# Patient Record
Sex: Female | Born: 1940
Health system: Southern US, Community
[De-identification: ages and names within clinical notes are randomized; demographics above are authoritative.]

## PROBLEM LIST (undated history)

## (undated) DIAGNOSIS — E119 Type 2 diabetes mellitus without complications: Secondary | ICD-10-CM

## (undated) DIAGNOSIS — F411 Generalized anxiety disorder: Secondary | ICD-10-CM

## (undated) DIAGNOSIS — I10 Essential (primary) hypertension: Secondary | ICD-10-CM

## (undated) DIAGNOSIS — K219 Gastro-esophageal reflux disease without esophagitis: Secondary | ICD-10-CM

## (undated) HISTORY — DX: Type 2 diabetes mellitus without complications: E11.9

## (undated) HISTORY — DX: Generalized anxiety disorder: F41.1

## (undated) HISTORY — PX: JOINT REPLACEMENT: SHX530

## (undated) HISTORY — DX: Gastro-esophageal reflux disease without esophagitis: K21.9

## (undated) HISTORY — DX: Essential (primary) hypertension: I10

---

## 1988-02-10 DIAGNOSIS — C801 Malignant (primary) neoplasm, unspecified: Secondary | ICD-10-CM

## 1988-02-10 HISTORY — PX: MASTECTOMY: SHX3

## 1988-02-10 HISTORY — DX: Malignant (primary) neoplasm, unspecified: C80.1

## 1997-10-02 ENCOUNTER — Other Ambulatory Visit: Admission: RE | Admit: 1997-10-02 | Discharge: 1997-10-02 | Payer: Self-pay | Admitting: Gynecology

## 1998-09-19 ENCOUNTER — Other Ambulatory Visit: Admission: RE | Admit: 1998-09-19 | Discharge: 1998-09-19 | Payer: Self-pay | Admitting: Gynecology

## 1998-10-07 ENCOUNTER — Other Ambulatory Visit: Admission: RE | Admit: 1998-10-07 | Discharge: 1998-10-07 | Payer: Self-pay | Admitting: Surgery

## 1998-11-27 ENCOUNTER — Encounter: Payer: Self-pay | Admitting: Surgery

## 1998-11-27 ENCOUNTER — Encounter: Admission: RE | Admit: 1998-11-27 | Discharge: 1998-11-27 | Payer: Self-pay | Admitting: Surgery

## 1998-11-28 ENCOUNTER — Ambulatory Visit (HOSPITAL_BASED_OUTPATIENT_CLINIC_OR_DEPARTMENT_OTHER): Admission: RE | Admit: 1998-11-28 | Discharge: 1998-11-28 | Payer: Self-pay | Admitting: Surgery

## 1999-04-23 ENCOUNTER — Encounter: Payer: Self-pay | Admitting: Oncology

## 1999-04-23 ENCOUNTER — Encounter: Admission: RE | Admit: 1999-04-23 | Discharge: 1999-04-23 | Payer: Self-pay | Admitting: Oncology

## 1999-06-23 ENCOUNTER — Encounter: Payer: Self-pay | Admitting: Emergency Medicine

## 1999-06-23 ENCOUNTER — Emergency Department (HOSPITAL_COMMUNITY): Admission: EM | Admit: 1999-06-23 | Discharge: 1999-06-23 | Payer: Self-pay | Admitting: Emergency Medicine

## 1999-09-23 ENCOUNTER — Other Ambulatory Visit: Admission: RE | Admit: 1999-09-23 | Discharge: 1999-09-23 | Payer: Self-pay | Admitting: Gynecology

## 2000-04-26 ENCOUNTER — Encounter: Payer: Self-pay | Admitting: Gynecology

## 2000-04-26 ENCOUNTER — Encounter: Admission: RE | Admit: 2000-04-26 | Discharge: 2000-04-26 | Payer: Self-pay | Admitting: Gynecology

## 2000-09-28 ENCOUNTER — Other Ambulatory Visit: Admission: RE | Admit: 2000-09-28 | Discharge: 2000-09-28 | Payer: Self-pay | Admitting: Gynecology

## 2001-04-19 ENCOUNTER — Encounter: Payer: Self-pay | Admitting: Family Medicine

## 2001-04-19 ENCOUNTER — Encounter: Admission: RE | Admit: 2001-04-19 | Discharge: 2001-04-19 | Payer: Self-pay | Admitting: Family Medicine

## 2001-10-04 ENCOUNTER — Other Ambulatory Visit: Admission: RE | Admit: 2001-10-04 | Discharge: 2001-10-04 | Payer: Self-pay | Admitting: Gynecology

## 2002-04-21 ENCOUNTER — Encounter: Admission: RE | Admit: 2002-04-21 | Discharge: 2002-04-21 | Payer: Self-pay | Admitting: Oncology

## 2002-04-21 ENCOUNTER — Encounter: Payer: Self-pay | Admitting: Oncology

## 2003-05-03 ENCOUNTER — Encounter: Admission: RE | Admit: 2003-05-03 | Discharge: 2003-05-03 | Payer: Self-pay | Admitting: Oncology

## 2003-10-16 ENCOUNTER — Other Ambulatory Visit: Admission: RE | Admit: 2003-10-16 | Discharge: 2003-10-16 | Payer: Self-pay | Admitting: Gynecology

## 2004-05-22 ENCOUNTER — Ambulatory Visit: Payer: Self-pay | Admitting: Oncology

## 2004-10-20 ENCOUNTER — Other Ambulatory Visit: Admission: RE | Admit: 2004-10-20 | Discharge: 2004-10-20 | Payer: Self-pay | Admitting: Gynecology

## 2005-05-08 ENCOUNTER — Ambulatory Visit: Payer: Self-pay | Admitting: Oncology

## 2006-05-13 ENCOUNTER — Ambulatory Visit: Payer: Self-pay | Admitting: Oncology

## 2006-11-01 ENCOUNTER — Other Ambulatory Visit: Admission: RE | Admit: 2006-11-01 | Discharge: 2006-11-01 | Payer: Self-pay | Admitting: Gynecology

## 2010-01-27 ENCOUNTER — Inpatient Hospital Stay (HOSPITAL_COMMUNITY)
Admission: RE | Admit: 2010-01-27 | Discharge: 2010-01-30 | Payer: Self-pay | Source: Home / Self Care | Attending: Orthopedic Surgery | Admitting: Orthopedic Surgery

## 2010-04-21 LAB — GLUCOSE, CAPILLARY
Glucose-Capillary: 119 mg/dL — ABNORMAL HIGH (ref 70–99)
Glucose-Capillary: 129 mg/dL — ABNORMAL HIGH (ref 70–99)
Glucose-Capillary: 137 mg/dL — ABNORMAL HIGH (ref 70–99)
Glucose-Capillary: 142 mg/dL — ABNORMAL HIGH (ref 70–99)
Glucose-Capillary: 143 mg/dL — ABNORMAL HIGH (ref 70–99)
Glucose-Capillary: 194 mg/dL — ABNORMAL HIGH (ref 70–99)

## 2010-04-21 LAB — PROTIME-INR: INR: 0.99 (ref 0.00–1.49)

## 2010-04-21 LAB — URINALYSIS, ROUTINE W REFLEX MICROSCOPIC
Hgb urine dipstick: NEGATIVE
Protein, ur: NEGATIVE mg/dL
Urobilinogen, UA: 0.2 mg/dL (ref 0.0–1.0)

## 2010-04-21 LAB — APTT: aPTT: 41 seconds — ABNORMAL HIGH (ref 24–37)

## 2010-04-21 LAB — ABO/RH: ABO/RH(D): O POS

## 2010-04-21 LAB — CROSSMATCH
ABO/RH(D): O POS
Antibody Screen: NEGATIVE
Unit division: 0
Unit division: 0

## 2010-04-21 LAB — BASIC METABOLIC PANEL
BUN: 8 mg/dL (ref 6–23)
CO2: 25 mEq/L (ref 19–32)
CO2: 27 mEq/L (ref 19–32)
Calcium: 8.4 mg/dL (ref 8.4–10.5)
Calcium: 8.7 mg/dL (ref 8.4–10.5)
Chloride: 107 mEq/L (ref 96–112)
Creatinine, Ser: 0.98 mg/dL (ref 0.4–1.2)
Creatinine, Ser: 1.04 mg/dL (ref 0.4–1.2)
GFR calc Af Amer: >60 mL/min (ref >60)
GFR calc non Af Amer: 51 mL/min — ABNORMAL LOW (ref >60)
GFR calc non Af Amer: 56 mL/min — ABNORMAL LOW (ref >60)
Glucose, Bld: 121 mg/dL — ABNORMAL HIGH (ref 70–99)
Glucose, Bld: 190 mg/dL — ABNORMAL HIGH (ref 70–99)
Potassium: 3.8 mEq/L (ref 3.5–5.1)
Sodium: 135 mEq/L (ref 135–145)
Sodium: 137 mEq/L (ref 135–145)
Sodium: 138 mEq/L (ref 135–145)

## 2010-04-21 LAB — URINE CULTURE

## 2010-04-21 LAB — COMPREHENSIVE METABOLIC PANEL
BUN: 12 mg/dL (ref 6–23)
Calcium: 9.5 mg/dL (ref 8.4–10.5)
Creatinine, Ser: 1.16 mg/dL (ref 0.4–1.2)
Glucose, Bld: 122 mg/dL — ABNORMAL HIGH (ref 70–99)
Total Protein: 7 g/dL (ref 6.0–8.3)

## 2010-04-21 LAB — CBC
HCT: 27.4 % — ABNORMAL LOW (ref 36.0–46.0)
HCT: 38.2 % (ref 36.0–46.0)
Hemoglobin: 9.1 g/dL — ABNORMAL LOW (ref 12.0–15.0)
Hemoglobin: 9.1 g/dL — ABNORMAL LOW (ref 12.0–15.0)
MCH: 30.7 pg (ref 26.0–34.0)
MCHC: 33.3 g/dL (ref 30.0–36.0)
MCHC: 34.2 g/dL (ref 30.0–36.0)
MCHC: 33 g/dL (ref 30.0–36.0)
MCV: 91 fL (ref 78.0–100.0)
MCV: 91.8 fL (ref 78.0–100.0)
Platelets: 228 10*3/uL (ref 150–400)
Platelets: 235 10*3/uL (ref 150–400)
RBC: 3.01 MIL/uL — ABNORMAL LOW (ref 3.87–5.11)
RDW: 12.6 % (ref 11.5–15.5)
RDW: 12.6 % (ref 11.5–15.5)
RDW: 12.5 % (ref 11.5–15.5)
WBC: 11.2 10*3/uL — ABNORMAL HIGH (ref 4.0–10.5)
WBC: 9.8 10*3/uL (ref 4.0–10.5)

## 2010-04-21 LAB — DIFFERENTIAL
Basophils Relative: 1 % (ref 0–1)
Lymphs Abs: 1.7 10*3/uL (ref 0.7–4.0)
Monocytes Relative: 8 % (ref 3–12)
Neutro Abs: 5.5 10*3/uL (ref 1.7–7.7)
Neutrophils Relative %: 69 % (ref 43–77)

## 2010-04-21 LAB — SURGICAL PCR SCREEN: MRSA, PCR: NEGATIVE

## 2010-04-21 LAB — HEMOGLOBIN A1C
Hgb A1c MFr Bld: 6.1 % — ABNORMAL HIGH (ref <5.7)
Mean Plasma Glucose: 128 mg/dL — ABNORMAL HIGH (ref <117)

## 2011-02-16 DIAGNOSIS — M1710 Unilateral primary osteoarthritis, unspecified knee: Secondary | ICD-10-CM | POA: Diagnosis not present

## 2011-02-24 DIAGNOSIS — R229 Localized swelling, mass and lump, unspecified: Secondary | ICD-10-CM | POA: Diagnosis not present

## 2011-05-04 DIAGNOSIS — K449 Diaphragmatic hernia without obstruction or gangrene: Secondary | ICD-10-CM | POA: Diagnosis not present

## 2011-05-04 DIAGNOSIS — K219 Gastro-esophageal reflux disease without esophagitis: Secondary | ICD-10-CM | POA: Diagnosis not present

## 2011-05-04 DIAGNOSIS — E119 Type 2 diabetes mellitus without complications: Secondary | ICD-10-CM | POA: Diagnosis not present

## 2011-05-04 DIAGNOSIS — E7800 Pure hypercholesterolemia, unspecified: Secondary | ICD-10-CM | POA: Diagnosis not present

## 2011-05-04 DIAGNOSIS — Z79899 Other long term (current) drug therapy: Secondary | ICD-10-CM | POA: Diagnosis not present

## 2011-05-04 DIAGNOSIS — R143 Flatulence: Secondary | ICD-10-CM | POA: Diagnosis not present

## 2011-05-04 DIAGNOSIS — R141 Gas pain: Secondary | ICD-10-CM | POA: Diagnosis not present

## 2011-05-04 DIAGNOSIS — R358 Other polyuria: Secondary | ICD-10-CM | POA: Diagnosis not present

## 2011-05-04 DIAGNOSIS — R413 Other amnesia: Secondary | ICD-10-CM | POA: Diagnosis not present

## 2011-05-07 DIAGNOSIS — R109 Unspecified abdominal pain: Secondary | ICD-10-CM | POA: Diagnosis not present

## 2011-05-08 DIAGNOSIS — M545 Low back pain: Secondary | ICD-10-CM | POA: Diagnosis not present

## 2011-05-08 DIAGNOSIS — M25569 Pain in unspecified knee: Secondary | ICD-10-CM | POA: Diagnosis not present

## 2011-05-22 DIAGNOSIS — M899 Disorder of bone, unspecified: Secondary | ICD-10-CM | POA: Diagnosis not present

## 2011-05-22 DIAGNOSIS — K449 Diaphragmatic hernia without obstruction or gangrene: Secondary | ICD-10-CM | POA: Diagnosis not present

## 2011-05-22 DIAGNOSIS — M949 Disorder of cartilage, unspecified: Secondary | ICD-10-CM | POA: Diagnosis not present

## 2011-05-22 DIAGNOSIS — M545 Low back pain: Secondary | ICD-10-CM | POA: Diagnosis not present

## 2011-05-22 DIAGNOSIS — M5137 Other intervertebral disc degeneration, lumbosacral region: Secondary | ICD-10-CM | POA: Diagnosis not present

## 2011-06-04 DIAGNOSIS — K219 Gastro-esophageal reflux disease without esophagitis: Secondary | ICD-10-CM | POA: Diagnosis not present

## 2011-06-17 DIAGNOSIS — Z1231 Encounter for screening mammogram for malignant neoplasm of breast: Secondary | ICD-10-CM | POA: Diagnosis not present

## 2011-06-17 DIAGNOSIS — Z853 Personal history of malignant neoplasm of breast: Secondary | ICD-10-CM | POA: Diagnosis not present

## 2011-06-17 DIAGNOSIS — Z9010 Acquired absence of unspecified breast and nipple: Secondary | ICD-10-CM | POA: Diagnosis not present

## 2011-06-22 DIAGNOSIS — Z853 Personal history of malignant neoplasm of breast: Secondary | ICD-10-CM | POA: Diagnosis not present

## 2011-08-04 DIAGNOSIS — E119 Type 2 diabetes mellitus without complications: Secondary | ICD-10-CM | POA: Diagnosis not present

## 2011-08-04 DIAGNOSIS — E7800 Pure hypercholesterolemia, unspecified: Secondary | ICD-10-CM | POA: Diagnosis not present

## 2011-08-04 DIAGNOSIS — K219 Gastro-esophageal reflux disease without esophagitis: Secondary | ICD-10-CM | POA: Diagnosis not present

## 2011-08-04 DIAGNOSIS — Z79899 Other long term (current) drug therapy: Secondary | ICD-10-CM | POA: Diagnosis not present

## 2011-08-04 DIAGNOSIS — R413 Other amnesia: Secondary | ICD-10-CM | POA: Diagnosis not present

## 2011-08-06 DIAGNOSIS — R634 Abnormal weight loss: Secondary | ICD-10-CM | POA: Diagnosis not present

## 2011-08-06 DIAGNOSIS — R413 Other amnesia: Secondary | ICD-10-CM | POA: Diagnosis not present

## 2011-08-06 DIAGNOSIS — G319 Degenerative disease of nervous system, unspecified: Secondary | ICD-10-CM | POA: Diagnosis not present

## 2011-08-31 DIAGNOSIS — L0292 Furuncle, unspecified: Secondary | ICD-10-CM | POA: Diagnosis not present

## 2011-08-31 DIAGNOSIS — L0293 Carbuncle, unspecified: Secondary | ICD-10-CM | POA: Diagnosis not present

## 2011-08-31 DIAGNOSIS — M1710 Unilateral primary osteoarthritis, unspecified knee: Secondary | ICD-10-CM | POA: Diagnosis not present

## 2011-08-31 DIAGNOSIS — R413 Other amnesia: Secondary | ICD-10-CM | POA: Diagnosis not present

## 2011-10-07 DIAGNOSIS — H52229 Regular astigmatism, unspecified eye: Secondary | ICD-10-CM | POA: Diagnosis not present

## 2011-10-07 DIAGNOSIS — H5231 Anisometropia: Secondary | ICD-10-CM | POA: Diagnosis not present

## 2011-10-07 DIAGNOSIS — E119 Type 2 diabetes mellitus without complications: Secondary | ICD-10-CM | POA: Diagnosis not present

## 2011-10-07 DIAGNOSIS — H524 Presbyopia: Secondary | ICD-10-CM | POA: Diagnosis not present

## 2011-10-28 DIAGNOSIS — T169XXA Foreign body in ear, unspecified ear, initial encounter: Secondary | ICD-10-CM | POA: Diagnosis not present

## 2011-11-04 DIAGNOSIS — Z79899 Other long term (current) drug therapy: Secondary | ICD-10-CM | POA: Diagnosis not present

## 2011-11-04 DIAGNOSIS — E119 Type 2 diabetes mellitus without complications: Secondary | ICD-10-CM | POA: Diagnosis not present

## 2011-11-04 DIAGNOSIS — L821 Other seborrheic keratosis: Secondary | ICD-10-CM | POA: Diagnosis not present

## 2011-11-04 DIAGNOSIS — K219 Gastro-esophageal reflux disease without esophagitis: Secondary | ICD-10-CM | POA: Diagnosis not present

## 2011-11-04 DIAGNOSIS — Z23 Encounter for immunization: Secondary | ICD-10-CM | POA: Diagnosis not present

## 2011-11-04 DIAGNOSIS — R358 Other polyuria: Secondary | ICD-10-CM | POA: Diagnosis not present

## 2011-11-04 DIAGNOSIS — E7800 Pure hypercholesterolemia, unspecified: Secondary | ICD-10-CM | POA: Diagnosis not present

## 2011-12-21 DIAGNOSIS — J0100 Acute maxillary sinusitis, unspecified: Secondary | ICD-10-CM | POA: Diagnosis not present

## 2012-01-11 DIAGNOSIS — R1012 Left upper quadrant pain: Secondary | ICD-10-CM | POA: Diagnosis not present

## 2012-01-25 DIAGNOSIS — Z0000 Encounter for general adult medical examination without abnormal findings: Secondary | ICD-10-CM | POA: Diagnosis not present

## 2012-02-05 DIAGNOSIS — K219 Gastro-esophageal reflux disease without esophagitis: Secondary | ICD-10-CM | POA: Diagnosis not present

## 2012-02-05 DIAGNOSIS — E7800 Pure hypercholesterolemia, unspecified: Secondary | ICD-10-CM | POA: Diagnosis not present

## 2012-02-05 DIAGNOSIS — Z79899 Other long term (current) drug therapy: Secondary | ICD-10-CM | POA: Diagnosis not present

## 2012-02-05 DIAGNOSIS — Z1239 Encounter for other screening for malignant neoplasm of breast: Secondary | ICD-10-CM | POA: Diagnosis not present

## 2012-02-05 DIAGNOSIS — E1149 Type 2 diabetes mellitus with other diabetic neurological complication: Secondary | ICD-10-CM | POA: Diagnosis not present

## 2012-04-20 DIAGNOSIS — E109 Type 1 diabetes mellitus without complications: Secondary | ICD-10-CM | POA: Diagnosis not present

## 2012-04-20 DIAGNOSIS — R5381 Other malaise: Secondary | ICD-10-CM | POA: Diagnosis not present

## 2012-04-20 DIAGNOSIS — R5383 Other fatigue: Secondary | ICD-10-CM | POA: Diagnosis not present

## 2012-04-25 DIAGNOSIS — R5383 Other fatigue: Secondary | ICD-10-CM | POA: Diagnosis not present

## 2012-04-25 DIAGNOSIS — E1149 Type 2 diabetes mellitus with other diabetic neurological complication: Secondary | ICD-10-CM | POA: Diagnosis not present

## 2012-04-25 DIAGNOSIS — R209 Unspecified disturbances of skin sensation: Secondary | ICD-10-CM | POA: Diagnosis not present

## 2012-04-25 DIAGNOSIS — R5381 Other malaise: Secondary | ICD-10-CM | POA: Diagnosis not present

## 2012-05-19 DIAGNOSIS — J0100 Acute maxillary sinusitis, unspecified: Secondary | ICD-10-CM | POA: Diagnosis not present

## 2012-05-26 DIAGNOSIS — L820 Inflamed seborrheic keratosis: Secondary | ICD-10-CM | POA: Diagnosis not present

## 2012-06-06 DIAGNOSIS — E1149 Type 2 diabetes mellitus with other diabetic neurological complication: Secondary | ICD-10-CM | POA: Diagnosis not present

## 2012-06-06 DIAGNOSIS — E7800 Pure hypercholesterolemia, unspecified: Secondary | ICD-10-CM | POA: Diagnosis not present

## 2012-06-20 DIAGNOSIS — Z1231 Encounter for screening mammogram for malignant neoplasm of breast: Secondary | ICD-10-CM | POA: Diagnosis not present

## 2012-06-20 DIAGNOSIS — N63 Unspecified lump in unspecified breast: Secondary | ICD-10-CM | POA: Diagnosis not present

## 2012-06-27 DIAGNOSIS — Z09 Encounter for follow-up examination after completed treatment for conditions other than malignant neoplasm: Secondary | ICD-10-CM | POA: Diagnosis not present

## 2012-06-27 DIAGNOSIS — Z853 Personal history of malignant neoplasm of breast: Secondary | ICD-10-CM | POA: Diagnosis not present

## 2012-07-25 DIAGNOSIS — Z96659 Presence of unspecified artificial knee joint: Secondary | ICD-10-CM | POA: Diagnosis not present

## 2012-07-25 HISTORY — DX: Presence of unspecified artificial knee joint: Z96.659

## 2012-09-06 DIAGNOSIS — E7800 Pure hypercholesterolemia, unspecified: Secondary | ICD-10-CM | POA: Diagnosis not present

## 2012-09-06 DIAGNOSIS — E119 Type 2 diabetes mellitus without complications: Secondary | ICD-10-CM | POA: Diagnosis not present

## 2012-09-06 DIAGNOSIS — Z79899 Other long term (current) drug therapy: Secondary | ICD-10-CM | POA: Diagnosis not present

## 2012-09-06 DIAGNOSIS — E1149 Type 2 diabetes mellitus with other diabetic neurological complication: Secondary | ICD-10-CM | POA: Diagnosis not present

## 2012-09-15 DIAGNOSIS — R112 Nausea with vomiting, unspecified: Secondary | ICD-10-CM | POA: Diagnosis not present

## 2012-09-15 DIAGNOSIS — E119 Type 2 diabetes mellitus without complications: Secondary | ICD-10-CM | POA: Diagnosis not present

## 2012-09-15 DIAGNOSIS — E7800 Pure hypercholesterolemia, unspecified: Secondary | ICD-10-CM | POA: Diagnosis not present

## 2012-09-15 DIAGNOSIS — R1084 Generalized abdominal pain: Secondary | ICD-10-CM | POA: Diagnosis not present

## 2012-09-15 DIAGNOSIS — K219 Gastro-esophageal reflux disease without esophagitis: Secondary | ICD-10-CM | POA: Diagnosis not present

## 2012-09-15 DIAGNOSIS — K3184 Gastroparesis: Secondary | ICD-10-CM | POA: Diagnosis not present

## 2012-10-31 DIAGNOSIS — Z23 Encounter for immunization: Secondary | ICD-10-CM | POA: Diagnosis not present

## 2012-11-21 DIAGNOSIS — H52229 Regular astigmatism, unspecified eye: Secondary | ICD-10-CM | POA: Diagnosis not present

## 2012-11-21 DIAGNOSIS — H5231 Anisometropia: Secondary | ICD-10-CM | POA: Diagnosis not present

## 2012-11-21 DIAGNOSIS — E119 Type 2 diabetes mellitus without complications: Secondary | ICD-10-CM | POA: Diagnosis not present

## 2012-11-21 DIAGNOSIS — H524 Presbyopia: Secondary | ICD-10-CM | POA: Diagnosis not present

## 2012-12-12 DIAGNOSIS — R0789 Other chest pain: Secondary | ICD-10-CM | POA: Diagnosis not present

## 2012-12-12 DIAGNOSIS — E7800 Pure hypercholesterolemia, unspecified: Secondary | ICD-10-CM | POA: Diagnosis not present

## 2012-12-12 DIAGNOSIS — E1149 Type 2 diabetes mellitus with other diabetic neurological complication: Secondary | ICD-10-CM | POA: Diagnosis not present

## 2012-12-12 DIAGNOSIS — E119 Type 2 diabetes mellitus without complications: Secondary | ICD-10-CM | POA: Diagnosis not present

## 2012-12-12 DIAGNOSIS — R072 Precordial pain: Secondary | ICD-10-CM | POA: Diagnosis not present

## 2013-02-09 HISTORY — PX: OTHER SURGICAL HISTORY: SHX169

## 2013-02-10 DIAGNOSIS — J0100 Acute maxillary sinusitis, unspecified: Secondary | ICD-10-CM | POA: Diagnosis not present

## 2013-03-13 DIAGNOSIS — Z96659 Presence of unspecified artificial knee joint: Secondary | ICD-10-CM | POA: Diagnosis not present

## 2013-04-13 DIAGNOSIS — K5730 Diverticulosis of large intestine without perforation or abscess without bleeding: Secondary | ICD-10-CM | POA: Diagnosis not present

## 2013-04-13 DIAGNOSIS — K219 Gastro-esophageal reflux disease without esophagitis: Secondary | ICD-10-CM | POA: Diagnosis not present

## 2013-04-13 DIAGNOSIS — K5900 Constipation, unspecified: Secondary | ICD-10-CM | POA: Diagnosis not present

## 2013-04-13 DIAGNOSIS — R1032 Left lower quadrant pain: Secondary | ICD-10-CM | POA: Diagnosis not present

## 2013-05-11 DIAGNOSIS — Z79899 Other long term (current) drug therapy: Secondary | ICD-10-CM | POA: Diagnosis not present

## 2013-05-11 DIAGNOSIS — K589 Irritable bowel syndrome without diarrhea: Secondary | ICD-10-CM | POA: Diagnosis not present

## 2013-05-11 DIAGNOSIS — K219 Gastro-esophageal reflux disease without esophagitis: Secondary | ICD-10-CM | POA: Diagnosis not present

## 2013-05-11 DIAGNOSIS — E1149 Type 2 diabetes mellitus with other diabetic neurological complication: Secondary | ICD-10-CM | POA: Diagnosis not present

## 2013-05-11 DIAGNOSIS — E119 Type 2 diabetes mellitus without complications: Secondary | ICD-10-CM | POA: Diagnosis not present

## 2013-05-11 DIAGNOSIS — E7800 Pure hypercholesterolemia, unspecified: Secondary | ICD-10-CM | POA: Diagnosis not present

## 2013-05-15 DIAGNOSIS — R1032 Left lower quadrant pain: Secondary | ICD-10-CM | POA: Diagnosis not present

## 2013-05-15 DIAGNOSIS — K5900 Constipation, unspecified: Secondary | ICD-10-CM | POA: Diagnosis not present

## 2013-05-15 DIAGNOSIS — K219 Gastro-esophageal reflux disease without esophagitis: Secondary | ICD-10-CM | POA: Diagnosis not present

## 2013-05-15 DIAGNOSIS — K5730 Diverticulosis of large intestine without perforation or abscess without bleeding: Secondary | ICD-10-CM | POA: Diagnosis not present

## 2013-05-17 DIAGNOSIS — K219 Gastro-esophageal reflux disease without esophagitis: Secondary | ICD-10-CM | POA: Diagnosis not present

## 2013-05-17 DIAGNOSIS — K228 Other specified diseases of esophagus: Secondary | ICD-10-CM | POA: Diagnosis not present

## 2013-05-17 DIAGNOSIS — K2289 Other specified disease of esophagus: Secondary | ICD-10-CM | POA: Diagnosis not present

## 2013-05-17 DIAGNOSIS — K449 Diaphragmatic hernia without obstruction or gangrene: Secondary | ICD-10-CM | POA: Diagnosis not present

## 2013-05-23 DIAGNOSIS — K219 Gastro-esophageal reflux disease without esophagitis: Secondary | ICD-10-CM | POA: Diagnosis not present

## 2013-05-23 DIAGNOSIS — E7800 Pure hypercholesterolemia, unspecified: Secondary | ICD-10-CM | POA: Diagnosis not present

## 2013-05-23 DIAGNOSIS — R109 Unspecified abdominal pain: Secondary | ICD-10-CM | POA: Diagnosis not present

## 2013-05-23 DIAGNOSIS — E119 Type 2 diabetes mellitus without complications: Secondary | ICD-10-CM | POA: Diagnosis not present

## 2013-05-23 DIAGNOSIS — K5900 Constipation, unspecified: Secondary | ICD-10-CM | POA: Diagnosis not present

## 2013-05-23 DIAGNOSIS — R1084 Generalized abdominal pain: Secondary | ICD-10-CM | POA: Diagnosis not present

## 2013-05-23 DIAGNOSIS — Z79899 Other long term (current) drug therapy: Secondary | ICD-10-CM | POA: Diagnosis not present

## 2013-05-25 DIAGNOSIS — K449 Diaphragmatic hernia without obstruction or gangrene: Secondary | ICD-10-CM | POA: Diagnosis not present

## 2013-05-25 DIAGNOSIS — R1012 Left upper quadrant pain: Secondary | ICD-10-CM | POA: Diagnosis not present

## 2013-05-25 DIAGNOSIS — R918 Other nonspecific abnormal finding of lung field: Secondary | ICD-10-CM | POA: Diagnosis not present

## 2013-05-30 DIAGNOSIS — R141 Gas pain: Secondary | ICD-10-CM | POA: Diagnosis not present

## 2013-05-30 DIAGNOSIS — K449 Diaphragmatic hernia without obstruction or gangrene: Secondary | ICD-10-CM | POA: Diagnosis not present

## 2013-05-30 DIAGNOSIS — E119 Type 2 diabetes mellitus without complications: Secondary | ICD-10-CM | POA: Diagnosis not present

## 2013-05-30 DIAGNOSIS — K219 Gastro-esophageal reflux disease without esophagitis: Secondary | ICD-10-CM | POA: Diagnosis not present

## 2013-05-30 DIAGNOSIS — Z7982 Long term (current) use of aspirin: Secondary | ICD-10-CM | POA: Diagnosis not present

## 2013-05-30 DIAGNOSIS — M129 Arthropathy, unspecified: Secondary | ICD-10-CM | POA: Diagnosis not present

## 2013-05-30 DIAGNOSIS — R079 Chest pain, unspecified: Secondary | ICD-10-CM | POA: Diagnosis not present

## 2013-05-30 DIAGNOSIS — Z79899 Other long term (current) drug therapy: Secondary | ICD-10-CM | POA: Diagnosis not present

## 2013-05-30 DIAGNOSIS — R1032 Left lower quadrant pain: Secondary | ICD-10-CM | POA: Diagnosis not present

## 2013-05-30 DIAGNOSIS — E7800 Pure hypercholesterolemia, unspecified: Secondary | ICD-10-CM | POA: Diagnosis not present

## 2013-05-30 DIAGNOSIS — K5900 Constipation, unspecified: Secondary | ICD-10-CM | POA: Diagnosis not present

## 2013-05-30 DIAGNOSIS — Z853 Personal history of malignant neoplasm of breast: Secondary | ICD-10-CM | POA: Diagnosis not present

## 2013-05-30 DIAGNOSIS — R1310 Dysphagia, unspecified: Secondary | ICD-10-CM | POA: Diagnosis not present

## 2013-05-30 DIAGNOSIS — R1031 Right lower quadrant pain: Secondary | ICD-10-CM | POA: Diagnosis not present

## 2013-06-08 DIAGNOSIS — K449 Diaphragmatic hernia without obstruction or gangrene: Secondary | ICD-10-CM | POA: Diagnosis not present

## 2013-06-14 DIAGNOSIS — R1011 Right upper quadrant pain: Secondary | ICD-10-CM | POA: Diagnosis not present

## 2013-06-14 DIAGNOSIS — K449 Diaphragmatic hernia without obstruction or gangrene: Secondary | ICD-10-CM | POA: Diagnosis not present

## 2013-06-27 DIAGNOSIS — R12 Heartburn: Secondary | ICD-10-CM | POA: Diagnosis not present

## 2013-06-27 DIAGNOSIS — R1084 Generalized abdominal pain: Secondary | ICD-10-CM | POA: Diagnosis not present

## 2013-06-27 DIAGNOSIS — R142 Eructation: Secondary | ICD-10-CM | POA: Diagnosis not present

## 2013-06-27 DIAGNOSIS — K449 Diaphragmatic hernia without obstruction or gangrene: Secondary | ICD-10-CM | POA: Diagnosis not present

## 2013-06-27 DIAGNOSIS — R110 Nausea: Secondary | ICD-10-CM | POA: Diagnosis not present

## 2013-06-27 DIAGNOSIS — K3184 Gastroparesis: Secondary | ICD-10-CM | POA: Insufficient documentation

## 2013-06-27 DIAGNOSIS — Z853 Personal history of malignant neoplasm of breast: Secondary | ICD-10-CM | POA: Insufficient documentation

## 2013-06-27 DIAGNOSIS — E1142 Type 2 diabetes mellitus with diabetic polyneuropathy: Secondary | ICD-10-CM | POA: Insufficient documentation

## 2013-06-27 DIAGNOSIS — K5900 Constipation, unspecified: Secondary | ICD-10-CM | POA: Diagnosis not present

## 2013-06-27 DIAGNOSIS — R141 Gas pain: Secondary | ICD-10-CM | POA: Diagnosis not present

## 2013-06-28 DIAGNOSIS — K5900 Constipation, unspecified: Secondary | ICD-10-CM | POA: Diagnosis not present

## 2013-06-28 DIAGNOSIS — K219 Gastro-esophageal reflux disease without esophagitis: Secondary | ICD-10-CM | POA: Diagnosis not present

## 2013-07-10 DIAGNOSIS — Z1231 Encounter for screening mammogram for malignant neoplasm of breast: Secondary | ICD-10-CM | POA: Diagnosis not present

## 2013-07-12 DIAGNOSIS — Z853 Personal history of malignant neoplasm of breast: Secondary | ICD-10-CM | POA: Diagnosis not present

## 2013-07-12 DIAGNOSIS — Z09 Encounter for follow-up examination after completed treatment for conditions other than malignant neoplasm: Secondary | ICD-10-CM | POA: Diagnosis not present

## 2013-07-28 DIAGNOSIS — K449 Diaphragmatic hernia without obstruction or gangrene: Secondary | ICD-10-CM | POA: Diagnosis not present

## 2013-08-07 ENCOUNTER — Encounter (HOSPITAL_COMMUNITY)
Admission: RE | Disposition: A | Discharge status: Home / Self Care | Payer: Self-pay | Source: Ambulatory Visit | Attending: Gastroenterology

## 2013-08-07 ENCOUNTER — Ambulatory Visit (HOSPITAL_COMMUNITY)
Admission: RE | Admit: 2013-08-07 | Discharge: 2013-08-07 | Disposition: A | Discharge status: Home / Self Care | Payer: Medicare Other | Source: Ambulatory Visit | Attending: Gastroenterology | Admitting: Gastroenterology

## 2013-08-07 DIAGNOSIS — R12 Heartburn: Secondary | ICD-10-CM | POA: Diagnosis not present

## 2013-08-07 DIAGNOSIS — R1310 Dysphagia, unspecified: Secondary | ICD-10-CM | POA: Diagnosis not present

## 2013-08-07 DIAGNOSIS — K449 Diaphragmatic hernia without obstruction or gangrene: Secondary | ICD-10-CM | POA: Insufficient documentation

## 2013-08-07 HISTORY — PX: 24 HOUR PH STUDY: SHX5419

## 2013-08-07 HISTORY — PX: ESOPHAGEAL MANOMETRY: SHX5429

## 2013-08-07 SURGERY — MANOMETRY, ESOPHAGUS

## 2013-08-07 MED ORDER — LIDOCAINE VISCOUS 2 % MT SOLN
OROMUCOSAL | Status: AC
  Filled 2013-08-07: qty 15

## 2013-08-07 SURGICAL SUPPLY — 1 items: FACESHIELD LNG OPTICON STERILE (SAFETY) IMPLANT

## 2013-08-08 ENCOUNTER — Encounter (HOSPITAL_COMMUNITY): Payer: Self-pay | Admitting: Gastroenterology

## 2013-08-08 DIAGNOSIS — K449 Diaphragmatic hernia without obstruction or gangrene: Secondary | ICD-10-CM | POA: Diagnosis not present

## 2013-08-28 DIAGNOSIS — Z23 Encounter for immunization: Secondary | ICD-10-CM | POA: Diagnosis not present

## 2013-08-28 DIAGNOSIS — E7800 Pure hypercholesterolemia, unspecified: Secondary | ICD-10-CM | POA: Diagnosis not present

## 2013-08-28 DIAGNOSIS — K219 Gastro-esophageal reflux disease without esophagitis: Secondary | ICD-10-CM | POA: Diagnosis not present

## 2013-08-28 DIAGNOSIS — I10 Essential (primary) hypertension: Secondary | ICD-10-CM | POA: Diagnosis not present

## 2013-08-28 DIAGNOSIS — M2550 Pain in unspecified joint: Secondary | ICD-10-CM | POA: Diagnosis not present

## 2013-08-28 DIAGNOSIS — E1149 Type 2 diabetes mellitus with other diabetic neurological complication: Secondary | ICD-10-CM | POA: Diagnosis not present

## 2013-08-28 DIAGNOSIS — E119 Type 2 diabetes mellitus without complications: Secondary | ICD-10-CM | POA: Diagnosis not present

## 2013-09-04 DIAGNOSIS — K219 Gastro-esophageal reflux disease without esophagitis: Secondary | ICD-10-CM | POA: Diagnosis not present

## 2013-09-04 DIAGNOSIS — K5900 Constipation, unspecified: Secondary | ICD-10-CM | POA: Diagnosis not present

## 2013-09-10 DIAGNOSIS — E785 Hyperlipidemia, unspecified: Secondary | ICD-10-CM | POA: Diagnosis not present

## 2013-09-10 DIAGNOSIS — R109 Unspecified abdominal pain: Secondary | ICD-10-CM | POA: Diagnosis not present

## 2013-09-10 DIAGNOSIS — K219 Gastro-esophageal reflux disease without esophagitis: Secondary | ICD-10-CM | POA: Diagnosis not present

## 2013-09-10 DIAGNOSIS — K439 Ventral hernia without obstruction or gangrene: Secondary | ICD-10-CM | POA: Diagnosis not present

## 2013-09-10 DIAGNOSIS — I998 Other disorder of circulatory system: Secondary | ICD-10-CM | POA: Diagnosis not present

## 2013-09-10 DIAGNOSIS — K2990 Gastroduodenitis, unspecified, without bleeding: Secondary | ICD-10-CM | POA: Diagnosis not present

## 2013-09-10 DIAGNOSIS — R10819 Abdominal tenderness, unspecified site: Secondary | ICD-10-CM | POA: Diagnosis not present

## 2013-09-10 DIAGNOSIS — Z853 Personal history of malignant neoplasm of breast: Secondary | ICD-10-CM | POA: Diagnosis not present

## 2013-09-10 DIAGNOSIS — K2970 Gastritis, unspecified, without bleeding: Secondary | ICD-10-CM | POA: Diagnosis not present

## 2013-09-10 DIAGNOSIS — E119 Type 2 diabetes mellitus without complications: Secondary | ICD-10-CM | POA: Diagnosis not present

## 2013-09-20 DIAGNOSIS — K449 Diaphragmatic hernia without obstruction or gangrene: Secondary | ICD-10-CM | POA: Diagnosis not present

## 2013-09-29 DIAGNOSIS — K449 Diaphragmatic hernia without obstruction or gangrene: Secondary | ICD-10-CM | POA: Diagnosis not present

## 2013-10-12 DIAGNOSIS — Z01812 Encounter for preprocedural laboratory examination: Secondary | ICD-10-CM | POA: Diagnosis not present

## 2013-10-12 DIAGNOSIS — K449 Diaphragmatic hernia without obstruction or gangrene: Secondary | ICD-10-CM | POA: Diagnosis not present

## 2013-10-19 DIAGNOSIS — E119 Type 2 diabetes mellitus without complications: Secondary | ICD-10-CM | POA: Diagnosis present

## 2013-10-19 DIAGNOSIS — K440 Diaphragmatic hernia with obstruction, without gangrene: Secondary | ICD-10-CM | POA: Diagnosis not present

## 2013-10-19 DIAGNOSIS — Z880 Allergy status to penicillin: Secondary | ICD-10-CM | POA: Diagnosis not present

## 2013-10-19 DIAGNOSIS — E785 Hyperlipidemia, unspecified: Secondary | ICD-10-CM | POA: Diagnosis present

## 2013-10-19 DIAGNOSIS — Z96659 Presence of unspecified artificial knee joint: Secondary | ICD-10-CM | POA: Diagnosis not present

## 2013-10-19 DIAGNOSIS — K449 Diaphragmatic hernia without obstruction or gangrene: Secondary | ICD-10-CM | POA: Diagnosis not present

## 2013-10-19 DIAGNOSIS — K219 Gastro-esophageal reflux disease without esophagitis: Secondary | ICD-10-CM | POA: Diagnosis present

## 2013-10-19 DIAGNOSIS — Z853 Personal history of malignant neoplasm of breast: Secondary | ICD-10-CM | POA: Diagnosis not present

## 2013-10-19 DIAGNOSIS — M129 Arthropathy, unspecified: Secondary | ICD-10-CM | POA: Diagnosis present

## 2013-10-20 DIAGNOSIS — Z8719 Personal history of other diseases of the digestive system: Secondary | ICD-10-CM | POA: Insufficient documentation

## 2013-10-20 HISTORY — DX: Personal history of other diseases of the digestive system: Z87.19

## 2013-11-06 DIAGNOSIS — Z23 Encounter for immunization: Secondary | ICD-10-CM | POA: Diagnosis not present

## 2013-11-08 DIAGNOSIS — Z4789 Encounter for other orthopedic aftercare: Secondary | ICD-10-CM | POA: Diagnosis not present

## 2013-12-25 DIAGNOSIS — M2550 Pain in unspecified joint: Secondary | ICD-10-CM | POA: Diagnosis not present

## 2013-12-25 DIAGNOSIS — E1140 Type 2 diabetes mellitus with diabetic neuropathy, unspecified: Secondary | ICD-10-CM | POA: Diagnosis not present

## 2013-12-25 DIAGNOSIS — E780 Pure hypercholesterolemia: Secondary | ICD-10-CM | POA: Diagnosis not present

## 2013-12-25 DIAGNOSIS — R413 Other amnesia: Secondary | ICD-10-CM | POA: Diagnosis not present

## 2013-12-25 DIAGNOSIS — K219 Gastro-esophageal reflux disease without esophagitis: Secondary | ICD-10-CM | POA: Diagnosis not present

## 2013-12-28 DIAGNOSIS — H3530 Unspecified macular degeneration: Secondary | ICD-10-CM | POA: Diagnosis not present

## 2013-12-28 DIAGNOSIS — H3531 Nonexudative age-related macular degeneration: Secondary | ICD-10-CM | POA: Diagnosis not present

## 2013-12-28 DIAGNOSIS — E119 Type 2 diabetes mellitus without complications: Secondary | ICD-10-CM | POA: Diagnosis not present

## 2014-01-08 DIAGNOSIS — K219 Gastro-esophageal reflux disease without esophagitis: Secondary | ICD-10-CM | POA: Diagnosis not present

## 2014-01-19 DIAGNOSIS — Z0001 Encounter for general adult medical examination with abnormal findings: Secondary | ICD-10-CM | POA: Diagnosis not present

## 2014-01-19 DIAGNOSIS — Z1211 Encounter for screening for malignant neoplasm of colon: Secondary | ICD-10-CM | POA: Diagnosis not present

## 2014-01-26 DIAGNOSIS — Z9889 Other specified postprocedural states: Secondary | ICD-10-CM | POA: Diagnosis not present

## 2014-01-26 DIAGNOSIS — Z8719 Personal history of other diseases of the digestive system: Secondary | ICD-10-CM | POA: Diagnosis not present

## 2014-03-13 DIAGNOSIS — M2550 Pain in unspecified joint: Secondary | ICD-10-CM | POA: Diagnosis not present

## 2014-03-13 DIAGNOSIS — N644 Mastodynia: Secondary | ICD-10-CM | POA: Diagnosis not present

## 2014-03-13 DIAGNOSIS — J069 Acute upper respiratory infection, unspecified: Secondary | ICD-10-CM | POA: Diagnosis not present

## 2014-03-19 DIAGNOSIS — R928 Other abnormal and inconclusive findings on diagnostic imaging of breast: Secondary | ICD-10-CM | POA: Diagnosis not present

## 2014-03-19 DIAGNOSIS — N644 Mastodynia: Secondary | ICD-10-CM | POA: Diagnosis not present

## 2014-04-02 DIAGNOSIS — E1140 Type 2 diabetes mellitus with diabetic neuropathy, unspecified: Secondary | ICD-10-CM | POA: Diagnosis not present

## 2014-04-02 DIAGNOSIS — M2550 Pain in unspecified joint: Secondary | ICD-10-CM | POA: Diagnosis not present

## 2014-04-02 DIAGNOSIS — E780 Pure hypercholesterolemia: Secondary | ICD-10-CM | POA: Diagnosis not present

## 2014-04-09 DIAGNOSIS — Z8719 Personal history of other diseases of the digestive system: Secondary | ICD-10-CM | POA: Diagnosis not present

## 2014-04-09 DIAGNOSIS — K219 Gastro-esophageal reflux disease without esophagitis: Secondary | ICD-10-CM | POA: Diagnosis not present

## 2014-04-09 DIAGNOSIS — M8580 Other specified disorders of bone density and structure, unspecified site: Secondary | ICD-10-CM | POA: Diagnosis not present

## 2014-04-09 DIAGNOSIS — E119 Type 2 diabetes mellitus without complications: Secondary | ICD-10-CM | POA: Diagnosis not present

## 2014-04-09 DIAGNOSIS — R0600 Dyspnea, unspecified: Secondary | ICD-10-CM | POA: Diagnosis not present

## 2014-04-09 DIAGNOSIS — R109 Unspecified abdominal pain: Secondary | ICD-10-CM | POA: Diagnosis not present

## 2014-04-09 DIAGNOSIS — Z794 Long term (current) use of insulin: Secondary | ICD-10-CM | POA: Diagnosis not present

## 2014-04-09 DIAGNOSIS — R110 Nausea: Secondary | ICD-10-CM | POA: Diagnosis not present

## 2014-04-09 DIAGNOSIS — Z9889 Other specified postprocedural states: Secondary | ICD-10-CM | POA: Diagnosis not present

## 2014-04-09 DIAGNOSIS — K449 Diaphragmatic hernia without obstruction or gangrene: Secondary | ICD-10-CM | POA: Diagnosis not present

## 2014-04-09 DIAGNOSIS — R1013 Epigastric pain: Secondary | ICD-10-CM | POA: Diagnosis not present

## 2014-04-09 DIAGNOSIS — M810 Age-related osteoporosis without current pathological fracture: Secondary | ICD-10-CM | POA: Diagnosis not present

## 2014-04-09 DIAGNOSIS — E785 Hyperlipidemia, unspecified: Secondary | ICD-10-CM | POA: Diagnosis not present

## 2014-04-09 DIAGNOSIS — M545 Low back pain: Secondary | ICD-10-CM | POA: Diagnosis not present

## 2014-04-09 DIAGNOSIS — R1084 Generalized abdominal pain: Secondary | ICD-10-CM | POA: Diagnosis not present

## 2014-04-10 DIAGNOSIS — R109 Unspecified abdominal pain: Secondary | ICD-10-CM | POA: Diagnosis not present

## 2014-04-20 DIAGNOSIS — Z853 Personal history of malignant neoplasm of breast: Secondary | ICD-10-CM | POA: Diagnosis not present

## 2014-04-20 DIAGNOSIS — N644 Mastodynia: Secondary | ICD-10-CM | POA: Diagnosis not present

## 2014-04-20 DIAGNOSIS — R0789 Other chest pain: Secondary | ICD-10-CM | POA: Diagnosis not present

## 2014-04-24 DIAGNOSIS — Z96651 Presence of right artificial knee joint: Secondary | ICD-10-CM | POA: Diagnosis not present

## 2014-04-24 DIAGNOSIS — Z9012 Acquired absence of left breast and nipple: Secondary | ICD-10-CM | POA: Diagnosis not present

## 2014-04-24 DIAGNOSIS — R0781 Pleurodynia: Secondary | ICD-10-CM | POA: Diagnosis not present

## 2014-04-24 DIAGNOSIS — Z853 Personal history of malignant neoplasm of breast: Secondary | ICD-10-CM | POA: Diagnosis not present

## 2014-04-25 DIAGNOSIS — R937 Abnormal findings on diagnostic imaging of other parts of musculoskeletal system: Secondary | ICD-10-CM | POA: Diagnosis not present

## 2014-04-25 DIAGNOSIS — Z853 Personal history of malignant neoplasm of breast: Secondary | ICD-10-CM | POA: Diagnosis not present

## 2014-04-25 DIAGNOSIS — M5136 Other intervertebral disc degeneration, lumbar region: Secondary | ICD-10-CM | POA: Diagnosis not present

## 2014-04-25 DIAGNOSIS — M4316 Spondylolisthesis, lumbar region: Secondary | ICD-10-CM | POA: Diagnosis not present

## 2014-04-25 DIAGNOSIS — M2578 Osteophyte, vertebrae: Secondary | ICD-10-CM | POA: Diagnosis not present

## 2014-04-26 DIAGNOSIS — R1084 Generalized abdominal pain: Secondary | ICD-10-CM | POA: Diagnosis not present

## 2014-04-26 DIAGNOSIS — F411 Generalized anxiety disorder: Secondary | ICD-10-CM | POA: Diagnosis not present

## 2014-04-26 DIAGNOSIS — R110 Nausea: Secondary | ICD-10-CM | POA: Diagnosis not present

## 2014-05-03 DIAGNOSIS — E119 Type 2 diabetes mellitus without complications: Secondary | ICD-10-CM | POA: Diagnosis not present

## 2014-05-04 DIAGNOSIS — R1084 Generalized abdominal pain: Secondary | ICD-10-CM | POA: Diagnosis not present

## 2014-05-04 DIAGNOSIS — F411 Generalized anxiety disorder: Secondary | ICD-10-CM | POA: Diagnosis not present

## 2014-05-04 DIAGNOSIS — R110 Nausea: Secondary | ICD-10-CM | POA: Diagnosis not present

## 2014-05-24 DIAGNOSIS — L84 Corns and callosities: Secondary | ICD-10-CM | POA: Diagnosis not present

## 2014-06-01 DIAGNOSIS — Z09 Encounter for follow-up examination after completed treatment for conditions other than malignant neoplasm: Secondary | ICD-10-CM | POA: Diagnosis not present

## 2014-06-01 DIAGNOSIS — Z7982 Long term (current) use of aspirin: Secondary | ICD-10-CM | POA: Diagnosis not present

## 2014-06-01 DIAGNOSIS — K449 Diaphragmatic hernia without obstruction or gangrene: Secondary | ICD-10-CM | POA: Diagnosis not present

## 2014-06-01 DIAGNOSIS — R110 Nausea: Secondary | ICD-10-CM | POA: Diagnosis not present

## 2014-06-04 DIAGNOSIS — M2010 Hallux valgus (acquired), unspecified foot: Secondary | ICD-10-CM | POA: Diagnosis not present

## 2014-06-04 DIAGNOSIS — E119 Type 2 diabetes mellitus without complications: Secondary | ICD-10-CM | POA: Diagnosis not present

## 2014-06-04 DIAGNOSIS — L84 Corns and callosities: Secondary | ICD-10-CM | POA: Diagnosis not present

## 2014-07-18 DIAGNOSIS — Z853 Personal history of malignant neoplasm of breast: Secondary | ICD-10-CM | POA: Diagnosis not present

## 2014-08-06 DIAGNOSIS — E1140 Type 2 diabetes mellitus with diabetic neuropathy, unspecified: Secondary | ICD-10-CM | POA: Diagnosis not present

## 2014-08-06 DIAGNOSIS — E119 Type 2 diabetes mellitus without complications: Secondary | ICD-10-CM | POA: Diagnosis not present

## 2014-08-06 DIAGNOSIS — E782 Mixed hyperlipidemia: Secondary | ICD-10-CM | POA: Diagnosis not present

## 2014-08-06 DIAGNOSIS — M2550 Pain in unspecified joint: Secondary | ICD-10-CM | POA: Diagnosis not present

## 2014-08-06 DIAGNOSIS — I10 Essential (primary) hypertension: Secondary | ICD-10-CM | POA: Diagnosis not present

## 2014-08-06 DIAGNOSIS — E780 Pure hypercholesterolemia: Secondary | ICD-10-CM | POA: Diagnosis not present

## 2014-10-22 DIAGNOSIS — Z23 Encounter for immunization: Secondary | ICD-10-CM | POA: Diagnosis not present

## 2014-11-02 DIAGNOSIS — K5710 Diverticulosis of small intestine without perforation or abscess without bleeding: Secondary | ICD-10-CM | POA: Diagnosis not present

## 2014-11-02 DIAGNOSIS — Z9889 Other specified postprocedural states: Secondary | ICD-10-CM | POA: Diagnosis not present

## 2014-11-02 DIAGNOSIS — K228 Other specified diseases of esophagus: Secondary | ICD-10-CM | POA: Diagnosis not present

## 2014-11-02 DIAGNOSIS — K449 Diaphragmatic hernia without obstruction or gangrene: Secondary | ICD-10-CM | POA: Diagnosis not present

## 2014-11-02 DIAGNOSIS — K219 Gastro-esophageal reflux disease without esophagitis: Secondary | ICD-10-CM | POA: Diagnosis not present

## 2014-11-12 DIAGNOSIS — E1140 Type 2 diabetes mellitus with diabetic neuropathy, unspecified: Secondary | ICD-10-CM | POA: Diagnosis not present

## 2014-11-12 DIAGNOSIS — E782 Mixed hyperlipidemia: Secondary | ICD-10-CM | POA: Diagnosis not present

## 2014-11-13 DIAGNOSIS — K219 Gastro-esophageal reflux disease without esophagitis: Secondary | ICD-10-CM | POA: Diagnosis not present

## 2014-11-13 DIAGNOSIS — R109 Unspecified abdominal pain: Secondary | ICD-10-CM | POA: Diagnosis not present

## 2014-11-13 DIAGNOSIS — R110 Nausea: Secondary | ICD-10-CM | POA: Diagnosis not present

## 2014-11-13 DIAGNOSIS — K449 Diaphragmatic hernia without obstruction or gangrene: Secondary | ICD-10-CM | POA: Diagnosis not present

## 2014-11-13 DIAGNOSIS — K5900 Constipation, unspecified: Secondary | ICD-10-CM | POA: Diagnosis not present

## 2014-11-23 DIAGNOSIS — R8589 Other abnormal findings in specimens from digestive organs and abdominal cavity: Secondary | ICD-10-CM | POA: Diagnosis not present

## 2014-11-23 DIAGNOSIS — R933 Abnormal findings on diagnostic imaging of other parts of digestive tract: Secondary | ICD-10-CM | POA: Diagnosis not present

## 2014-11-23 DIAGNOSIS — Z79899 Other long term (current) drug therapy: Secondary | ICD-10-CM | POA: Diagnosis not present

## 2014-11-23 DIAGNOSIS — E119 Type 2 diabetes mellitus without complications: Secondary | ICD-10-CM | POA: Diagnosis not present

## 2014-11-23 DIAGNOSIS — K219 Gastro-esophageal reflux disease without esophagitis: Secondary | ICD-10-CM | POA: Diagnosis not present

## 2014-11-23 DIAGNOSIS — K449 Diaphragmatic hernia without obstruction or gangrene: Secondary | ICD-10-CM | POA: Diagnosis not present

## 2014-11-23 DIAGNOSIS — Z853 Personal history of malignant neoplasm of breast: Secondary | ICD-10-CM | POA: Diagnosis not present

## 2014-11-23 DIAGNOSIS — E785 Hyperlipidemia, unspecified: Secondary | ICD-10-CM | POA: Diagnosis not present

## 2015-01-14 DIAGNOSIS — K449 Diaphragmatic hernia without obstruction or gangrene: Secondary | ICD-10-CM | POA: Diagnosis not present

## 2015-01-14 DIAGNOSIS — R110 Nausea: Secondary | ICD-10-CM | POA: Diagnosis not present

## 2015-01-14 DIAGNOSIS — Z79899 Other long term (current) drug therapy: Secondary | ICD-10-CM | POA: Diagnosis not present

## 2015-01-14 DIAGNOSIS — K219 Gastro-esophageal reflux disease without esophagitis: Secondary | ICD-10-CM | POA: Diagnosis not present

## 2015-03-04 DIAGNOSIS — I10 Essential (primary) hypertension: Secondary | ICD-10-CM | POA: Diagnosis not present

## 2015-03-04 DIAGNOSIS — E1140 Type 2 diabetes mellitus with diabetic neuropathy, unspecified: Secondary | ICD-10-CM | POA: Diagnosis not present

## 2015-03-04 DIAGNOSIS — E782 Mixed hyperlipidemia: Secondary | ICD-10-CM | POA: Diagnosis not present

## 2015-03-06 DIAGNOSIS — Z9842 Cataract extraction status, left eye: Secondary | ICD-10-CM | POA: Diagnosis not present

## 2015-03-06 DIAGNOSIS — Z9841 Cataract extraction status, right eye: Secondary | ICD-10-CM | POA: Diagnosis not present

## 2015-03-06 DIAGNOSIS — H35373 Puckering of macula, bilateral: Secondary | ICD-10-CM | POA: Diagnosis not present

## 2015-03-06 DIAGNOSIS — H353131 Nonexudative age-related macular degeneration, bilateral, early dry stage: Secondary | ICD-10-CM | POA: Diagnosis not present

## 2015-03-06 DIAGNOSIS — H43813 Vitreous degeneration, bilateral: Secondary | ICD-10-CM | POA: Diagnosis not present

## 2015-03-06 DIAGNOSIS — Z961 Presence of intraocular lens: Secondary | ICD-10-CM | POA: Diagnosis not present

## 2015-03-06 DIAGNOSIS — E119 Type 2 diabetes mellitus without complications: Secondary | ICD-10-CM | POA: Diagnosis not present

## 2015-03-06 DIAGNOSIS — H5211 Myopia, right eye: Secondary | ICD-10-CM | POA: Diagnosis not present

## 2015-03-06 DIAGNOSIS — H5202 Hypermetropia, left eye: Secondary | ICD-10-CM | POA: Diagnosis not present

## 2015-03-06 DIAGNOSIS — H52223 Regular astigmatism, bilateral: Secondary | ICD-10-CM | POA: Diagnosis not present

## 2015-03-06 DIAGNOSIS — H43393 Other vitreous opacities, bilateral: Secondary | ICD-10-CM | POA: Diagnosis not present

## 2015-03-06 DIAGNOSIS — H524 Presbyopia: Secondary | ICD-10-CM | POA: Diagnosis not present

## 2015-03-11 DIAGNOSIS — E1142 Type 2 diabetes mellitus with diabetic polyneuropathy: Secondary | ICD-10-CM | POA: Diagnosis not present

## 2015-03-11 DIAGNOSIS — I10 Essential (primary) hypertension: Secondary | ICD-10-CM | POA: Diagnosis not present

## 2015-03-11 DIAGNOSIS — Z1231 Encounter for screening mammogram for malignant neoplasm of breast: Secondary | ICD-10-CM | POA: Diagnosis not present

## 2015-03-11 DIAGNOSIS — E782 Mixed hyperlipidemia: Secondary | ICD-10-CM | POA: Diagnosis not present

## 2015-03-18 DIAGNOSIS — Z9012 Acquired absence of left breast and nipple: Secondary | ICD-10-CM | POA: Diagnosis not present

## 2015-03-18 DIAGNOSIS — Z853 Personal history of malignant neoplasm of breast: Secondary | ICD-10-CM | POA: Diagnosis not present

## 2015-03-18 DIAGNOSIS — Z1231 Encounter for screening mammogram for malignant neoplasm of breast: Secondary | ICD-10-CM | POA: Diagnosis not present

## 2015-04-17 DIAGNOSIS — N6011 Diffuse cystic mastopathy of right breast: Secondary | ICD-10-CM | POA: Diagnosis not present

## 2015-04-17 DIAGNOSIS — Z853 Personal history of malignant neoplasm of breast: Secondary | ICD-10-CM | POA: Diagnosis not present

## 2015-04-17 DIAGNOSIS — N644 Mastodynia: Secondary | ICD-10-CM | POA: Diagnosis not present

## 2015-04-17 DIAGNOSIS — R0781 Pleurodynia: Secondary | ICD-10-CM | POA: Diagnosis not present

## 2015-05-15 DIAGNOSIS — E119 Type 2 diabetes mellitus without complications: Secondary | ICD-10-CM | POA: Diagnosis not present

## 2015-05-15 DIAGNOSIS — K449 Diaphragmatic hernia without obstruction or gangrene: Secondary | ICD-10-CM | POA: Diagnosis not present

## 2015-05-15 DIAGNOSIS — K219 Gastro-esophageal reflux disease without esophagitis: Secondary | ICD-10-CM | POA: Diagnosis not present

## 2015-05-15 DIAGNOSIS — Z79899 Other long term (current) drug therapy: Secondary | ICD-10-CM | POA: Diagnosis not present

## 2015-05-15 DIAGNOSIS — R110 Nausea: Secondary | ICD-10-CM | POA: Diagnosis not present

## 2015-05-15 DIAGNOSIS — E785 Hyperlipidemia, unspecified: Secondary | ICD-10-CM | POA: Diagnosis not present

## 2015-05-15 DIAGNOSIS — Z7982 Long term (current) use of aspirin: Secondary | ICD-10-CM | POA: Diagnosis not present

## 2015-07-10 DIAGNOSIS — M47816 Spondylosis without myelopathy or radiculopathy, lumbar region: Secondary | ICD-10-CM | POA: Diagnosis not present

## 2015-07-10 DIAGNOSIS — E782 Mixed hyperlipidemia: Secondary | ICD-10-CM | POA: Diagnosis not present

## 2015-07-10 DIAGNOSIS — E1122 Type 2 diabetes mellitus with diabetic chronic kidney disease: Secondary | ICD-10-CM | POA: Diagnosis not present

## 2015-07-10 DIAGNOSIS — M545 Low back pain: Secondary | ICD-10-CM | POA: Diagnosis not present

## 2015-07-10 DIAGNOSIS — E1140 Type 2 diabetes mellitus with diabetic neuropathy, unspecified: Secondary | ICD-10-CM | POA: Diagnosis not present

## 2015-07-10 DIAGNOSIS — R590 Localized enlarged lymph nodes: Secondary | ICD-10-CM | POA: Diagnosis not present

## 2015-07-10 DIAGNOSIS — I700 Atherosclerosis of aorta: Secondary | ICD-10-CM | POA: Diagnosis not present

## 2015-07-10 DIAGNOSIS — I10 Essential (primary) hypertension: Secondary | ICD-10-CM | POA: Diagnosis not present

## 2015-07-11 DIAGNOSIS — R590 Localized enlarged lymph nodes: Secondary | ICD-10-CM | POA: Diagnosis not present

## 2015-07-16 DIAGNOSIS — R590 Localized enlarged lymph nodes: Secondary | ICD-10-CM | POA: Diagnosis not present

## 2015-07-16 DIAGNOSIS — E010 Iodine-deficiency related diffuse (endemic) goiter: Secondary | ICD-10-CM | POA: Diagnosis not present

## 2015-07-26 ENCOUNTER — Other Ambulatory Visit (HOSPITAL_COMMUNITY): Payer: Self-pay | Admitting: Interventional Radiology

## 2015-07-26 DIAGNOSIS — E041 Nontoxic single thyroid nodule: Secondary | ICD-10-CM | POA: Diagnosis not present

## 2015-07-26 DIAGNOSIS — R590 Localized enlarged lymph nodes: Secondary | ICD-10-CM | POA: Diagnosis not present

## 2015-08-07 DIAGNOSIS — C73 Malignant neoplasm of thyroid gland: Secondary | ICD-10-CM | POA: Diagnosis not present

## 2015-08-10 HISTORY — PX: THYROIDECTOMY: SHX17

## 2015-08-19 DIAGNOSIS — Z96651 Presence of right artificial knee joint: Secondary | ICD-10-CM | POA: Diagnosis not present

## 2015-08-19 DIAGNOSIS — Z471 Aftercare following joint replacement surgery: Secondary | ICD-10-CM | POA: Diagnosis not present

## 2015-08-30 DIAGNOSIS — Z853 Personal history of malignant neoplasm of breast: Secondary | ICD-10-CM | POA: Diagnosis not present

## 2015-08-30 DIAGNOSIS — Z79899 Other long term (current) drug therapy: Secondary | ICD-10-CM | POA: Diagnosis not present

## 2015-08-30 DIAGNOSIS — Z9012 Acquired absence of left breast and nipple: Secondary | ICD-10-CM | POA: Diagnosis not present

## 2015-08-30 DIAGNOSIS — Z7982 Long term (current) use of aspirin: Secondary | ICD-10-CM | POA: Diagnosis not present

## 2015-08-30 DIAGNOSIS — Z6833 Body mass index (BMI) 33.0-33.9, adult: Secondary | ICD-10-CM | POA: Diagnosis not present

## 2015-08-30 DIAGNOSIS — E669 Obesity, unspecified: Secondary | ICD-10-CM | POA: Diagnosis not present

## 2015-08-30 DIAGNOSIS — K219 Gastro-esophageal reflux disease without esophagitis: Secondary | ICD-10-CM | POA: Diagnosis not present

## 2015-08-30 DIAGNOSIS — C73 Malignant neoplasm of thyroid gland: Secondary | ICD-10-CM | POA: Diagnosis not present

## 2015-08-30 DIAGNOSIS — E119 Type 2 diabetes mellitus without complications: Secondary | ICD-10-CM | POA: Diagnosis not present

## 2015-08-30 DIAGNOSIS — I10 Essential (primary) hypertension: Secondary | ICD-10-CM | POA: Diagnosis not present

## 2015-08-31 DIAGNOSIS — I10 Essential (primary) hypertension: Secondary | ICD-10-CM | POA: Diagnosis not present

## 2015-08-31 DIAGNOSIS — Z9012 Acquired absence of left breast and nipple: Secondary | ICD-10-CM | POA: Diagnosis not present

## 2015-08-31 DIAGNOSIS — C73 Malignant neoplasm of thyroid gland: Secondary | ICD-10-CM | POA: Diagnosis not present

## 2015-08-31 DIAGNOSIS — Z853 Personal history of malignant neoplasm of breast: Secondary | ICD-10-CM | POA: Diagnosis not present

## 2015-08-31 DIAGNOSIS — E119 Type 2 diabetes mellitus without complications: Secondary | ICD-10-CM | POA: Diagnosis not present

## 2015-08-31 DIAGNOSIS — K219 Gastro-esophageal reflux disease without esophagitis: Secondary | ICD-10-CM | POA: Diagnosis not present

## 2015-09-03 DIAGNOSIS — H353131 Nonexudative age-related macular degeneration, bilateral, early dry stage: Secondary | ICD-10-CM | POA: Diagnosis not present

## 2015-09-03 DIAGNOSIS — I10 Essential (primary) hypertension: Secondary | ICD-10-CM | POA: Diagnosis not present

## 2015-10-02 DIAGNOSIS — E890 Postprocedural hypothyroidism: Secondary | ICD-10-CM | POA: Insufficient documentation

## 2015-10-02 DIAGNOSIS — E039 Hypothyroidism, unspecified: Secondary | ICD-10-CM | POA: Insufficient documentation

## 2015-10-02 DIAGNOSIS — C73 Malignant neoplasm of thyroid gland: Secondary | ICD-10-CM | POA: Diagnosis not present

## 2015-10-10 ENCOUNTER — Other Ambulatory Visit: Payer: Self-pay

## 2015-10-21 DIAGNOSIS — Z23 Encounter for immunization: Secondary | ICD-10-CM | POA: Diagnosis not present

## 2015-10-21 DIAGNOSIS — E782 Mixed hyperlipidemia: Secondary | ICD-10-CM | POA: Diagnosis not present

## 2015-10-21 DIAGNOSIS — I10 Essential (primary) hypertension: Secondary | ICD-10-CM | POA: Diagnosis not present

## 2015-10-21 DIAGNOSIS — E1142 Type 2 diabetes mellitus with diabetic polyneuropathy: Secondary | ICD-10-CM | POA: Diagnosis not present

## 2015-10-21 DIAGNOSIS — C73 Malignant neoplasm of thyroid gland: Secondary | ICD-10-CM | POA: Diagnosis not present

## 2015-11-04 DIAGNOSIS — E871 Hypo-osmolality and hyponatremia: Secondary | ICD-10-CM | POA: Diagnosis not present

## 2015-11-04 DIAGNOSIS — C73 Malignant neoplasm of thyroid gland: Secondary | ICD-10-CM | POA: Diagnosis not present

## 2015-11-04 DIAGNOSIS — E890 Postprocedural hypothyroidism: Secondary | ICD-10-CM | POA: Diagnosis not present

## 2015-11-07 DIAGNOSIS — I10 Essential (primary) hypertension: Secondary | ICD-10-CM | POA: Diagnosis not present

## 2015-11-12 ENCOUNTER — Other Ambulatory Visit (HOSPITAL_COMMUNITY): Payer: Self-pay | Admitting: Endocrinology

## 2015-11-12 DIAGNOSIS — C73 Malignant neoplasm of thyroid gland: Secondary | ICD-10-CM

## 2015-11-19 ENCOUNTER — Encounter (HOSPITAL_COMMUNITY)
Admission: RE | Admit: 2015-11-19 | Discharge: 2015-11-19 | Disposition: A | Discharge status: Home / Self Care | Payer: Medicare Other | Source: Ambulatory Visit | Attending: Endocrinology | Admitting: Endocrinology

## 2015-11-19 ENCOUNTER — Encounter (HOSPITAL_COMMUNITY): Payer: Self-pay | Admitting: Radiology

## 2015-11-19 DIAGNOSIS — C73 Malignant neoplasm of thyroid gland: Secondary | ICD-10-CM | POA: Diagnosis not present

## 2015-11-19 MED ORDER — SODIUM IODIDE I 131 CAPSULE
76.9000 | Freq: Once | INTRAVENOUS | Status: AC | PRN
  Administered 2015-11-19: 76.9 via ORAL

## 2015-11-29 ENCOUNTER — Encounter (HOSPITAL_COMMUNITY)
Admission: RE | Admit: 2015-11-29 | Discharge: 2015-11-29 | Disposition: A | Discharge status: Home / Self Care | Payer: Medicare Other | Source: Ambulatory Visit | Attending: Endocrinology | Admitting: Endocrinology

## 2015-11-29 DIAGNOSIS — C73 Malignant neoplasm of thyroid gland: Secondary | ICD-10-CM | POA: Insufficient documentation

## 2015-12-16 DIAGNOSIS — Z23 Encounter for immunization: Secondary | ICD-10-CM | POA: Diagnosis not present

## 2015-12-16 DIAGNOSIS — C73 Malignant neoplasm of thyroid gland: Secondary | ICD-10-CM | POA: Diagnosis not present

## 2015-12-16 DIAGNOSIS — I10 Essential (primary) hypertension: Secondary | ICD-10-CM | POA: Diagnosis not present

## 2015-12-16 DIAGNOSIS — E1142 Type 2 diabetes mellitus with diabetic polyneuropathy: Secondary | ICD-10-CM | POA: Diagnosis not present

## 2015-12-16 DIAGNOSIS — E782 Mixed hyperlipidemia: Secondary | ICD-10-CM | POA: Diagnosis not present

## 2015-12-26 DIAGNOSIS — H47232 Glaucomatous optic atrophy, left eye: Secondary | ICD-10-CM | POA: Diagnosis not present

## 2015-12-26 DIAGNOSIS — I10 Essential (primary) hypertension: Secondary | ICD-10-CM | POA: Diagnosis not present

## 2015-12-26 DIAGNOSIS — H40212 Acute angle-closure glaucoma, left eye: Secondary | ICD-10-CM | POA: Diagnosis not present

## 2015-12-27 DIAGNOSIS — H40212 Acute angle-closure glaucoma, left eye: Secondary | ICD-10-CM | POA: Diagnosis not present

## 2015-12-27 DIAGNOSIS — I10 Essential (primary) hypertension: Secondary | ICD-10-CM | POA: Diagnosis not present

## 2015-12-31 DIAGNOSIS — H40212 Acute angle-closure glaucoma, left eye: Secondary | ICD-10-CM | POA: Diagnosis not present

## 2016-01-01 DIAGNOSIS — B5801 Toxoplasma chorioretinitis: Secondary | ICD-10-CM | POA: Diagnosis not present

## 2016-01-06 DIAGNOSIS — B5801 Toxoplasma chorioretinitis: Secondary | ICD-10-CM | POA: Diagnosis not present

## 2016-01-13 DIAGNOSIS — B5801 Toxoplasma chorioretinitis: Secondary | ICD-10-CM | POA: Diagnosis not present

## 2016-01-13 DIAGNOSIS — H40052 Ocular hypertension, left eye: Secondary | ICD-10-CM | POA: Diagnosis not present

## 2016-01-20 DIAGNOSIS — B5801 Toxoplasma chorioretinitis: Secondary | ICD-10-CM | POA: Diagnosis not present

## 2016-01-23 DIAGNOSIS — E782 Mixed hyperlipidemia: Secondary | ICD-10-CM | POA: Diagnosis not present

## 2016-01-23 DIAGNOSIS — E038 Other specified hypothyroidism: Secondary | ICD-10-CM | POA: Diagnosis not present

## 2016-01-23 DIAGNOSIS — C73 Malignant neoplasm of thyroid gland: Secondary | ICD-10-CM | POA: Diagnosis not present

## 2016-01-23 DIAGNOSIS — E1140 Type 2 diabetes mellitus with diabetic neuropathy, unspecified: Secondary | ICD-10-CM | POA: Diagnosis not present

## 2016-01-23 DIAGNOSIS — I10 Essential (primary) hypertension: Secondary | ICD-10-CM | POA: Diagnosis not present

## 2016-02-06 DIAGNOSIS — H40052 Ocular hypertension, left eye: Secondary | ICD-10-CM | POA: Diagnosis not present

## 2016-02-11 DIAGNOSIS — C73 Malignant neoplasm of thyroid gland: Secondary | ICD-10-CM | POA: Diagnosis not present

## 2016-02-11 DIAGNOSIS — E890 Postprocedural hypothyroidism: Secondary | ICD-10-CM | POA: Diagnosis not present

## 2016-02-11 DIAGNOSIS — E871 Hypo-osmolality and hyponatremia: Secondary | ICD-10-CM | POA: Diagnosis not present

## 2016-03-05 DIAGNOSIS — H40003 Preglaucoma, unspecified, bilateral: Secondary | ICD-10-CM | POA: Diagnosis not present

## 2016-03-05 DIAGNOSIS — B5801 Toxoplasma chorioretinitis: Secondary | ICD-10-CM | POA: Diagnosis not present

## 2016-03-05 DIAGNOSIS — H469 Unspecified optic neuritis: Secondary | ICD-10-CM | POA: Diagnosis not present

## 2016-03-17 DIAGNOSIS — Z6831 Body mass index (BMI) 31.0-31.9, adult: Secondary | ICD-10-CM | POA: Diagnosis not present

## 2016-03-17 DIAGNOSIS — Z0000 Encounter for general adult medical examination without abnormal findings: Secondary | ICD-10-CM | POA: Diagnosis not present

## 2016-03-17 DIAGNOSIS — Z23 Encounter for immunization: Secondary | ICD-10-CM | POA: Diagnosis not present

## 2016-03-17 DIAGNOSIS — N951 Menopausal and female climacteric states: Secondary | ICD-10-CM | POA: Diagnosis not present

## 2016-03-17 HISTORY — PX: APPENDECTOMY: SHX54

## 2016-03-25 DIAGNOSIS — N959 Unspecified menopausal and perimenopausal disorder: Secondary | ICD-10-CM | POA: Diagnosis not present

## 2016-03-25 DIAGNOSIS — M8588 Other specified disorders of bone density and structure, other site: Secondary | ICD-10-CM | POA: Diagnosis not present

## 2016-03-25 DIAGNOSIS — M85852 Other specified disorders of bone density and structure, left thigh: Secondary | ICD-10-CM | POA: Diagnosis not present

## 2016-03-26 DIAGNOSIS — Z1231 Encounter for screening mammogram for malignant neoplasm of breast: Secondary | ICD-10-CM | POA: Diagnosis not present

## 2016-04-01 DIAGNOSIS — B5801 Toxoplasma chorioretinitis: Secondary | ICD-10-CM | POA: Diagnosis not present

## 2016-04-08 DIAGNOSIS — Z853 Personal history of malignant neoplasm of breast: Secondary | ICD-10-CM | POA: Diagnosis not present

## 2016-04-08 DIAGNOSIS — Z85850 Personal history of malignant neoplasm of thyroid: Secondary | ICD-10-CM | POA: Diagnosis not present

## 2016-04-08 DIAGNOSIS — M8580 Other specified disorders of bone density and structure, unspecified site: Secondary | ICD-10-CM | POA: Diagnosis not present

## 2016-04-24 DIAGNOSIS — C73 Malignant neoplasm of thyroid gland: Secondary | ICD-10-CM | POA: Diagnosis not present

## 2016-04-24 DIAGNOSIS — E782 Mixed hyperlipidemia: Secondary | ICD-10-CM | POA: Diagnosis not present

## 2016-04-24 DIAGNOSIS — M810 Age-related osteoporosis without current pathological fracture: Secondary | ICD-10-CM | POA: Diagnosis not present

## 2016-04-24 DIAGNOSIS — E1142 Type 2 diabetes mellitus with diabetic polyneuropathy: Secondary | ICD-10-CM | POA: Diagnosis not present

## 2016-04-24 DIAGNOSIS — I10 Essential (primary) hypertension: Secondary | ICD-10-CM | POA: Diagnosis not present

## 2016-05-07 DIAGNOSIS — Z9841 Cataract extraction status, right eye: Secondary | ICD-10-CM | POA: Diagnosis not present

## 2016-05-07 DIAGNOSIS — Z961 Presence of intraocular lens: Secondary | ICD-10-CM | POA: Diagnosis not present

## 2016-05-07 DIAGNOSIS — I10 Essential (primary) hypertension: Secondary | ICD-10-CM | POA: Diagnosis not present

## 2016-05-07 DIAGNOSIS — H35062 Retinal vasculitis, left eye: Secondary | ICD-10-CM | POA: Diagnosis not present

## 2016-05-07 DIAGNOSIS — H353131 Nonexudative age-related macular degeneration, bilateral, early dry stage: Secondary | ICD-10-CM | POA: Diagnosis not present

## 2016-05-07 DIAGNOSIS — H5211 Myopia, right eye: Secondary | ICD-10-CM | POA: Diagnosis not present

## 2016-05-07 DIAGNOSIS — Z9842 Cataract extraction status, left eye: Secondary | ICD-10-CM | POA: Diagnosis not present

## 2016-05-07 DIAGNOSIS — H5202 Hypermetropia, left eye: Secondary | ICD-10-CM | POA: Diagnosis not present

## 2016-05-07 DIAGNOSIS — E119 Type 2 diabetes mellitus without complications: Secondary | ICD-10-CM | POA: Diagnosis not present

## 2016-05-07 DIAGNOSIS — H524 Presbyopia: Secondary | ICD-10-CM | POA: Diagnosis not present

## 2016-05-15 DIAGNOSIS — K5900 Constipation, unspecified: Secondary | ICD-10-CM | POA: Diagnosis not present

## 2016-05-18 DIAGNOSIS — C73 Malignant neoplasm of thyroid gland: Secondary | ICD-10-CM | POA: Diagnosis not present

## 2016-05-18 DIAGNOSIS — E890 Postprocedural hypothyroidism: Secondary | ICD-10-CM | POA: Diagnosis not present

## 2016-05-18 DIAGNOSIS — E871 Hypo-osmolality and hyponatremia: Secondary | ICD-10-CM | POA: Diagnosis not present

## 2016-05-25 DIAGNOSIS — Z9089 Acquired absence of other organs: Secondary | ICD-10-CM | POA: Diagnosis not present

## 2016-05-25 DIAGNOSIS — C73 Malignant neoplasm of thyroid gland: Secondary | ICD-10-CM | POA: Diagnosis not present

## 2016-06-29 DIAGNOSIS — B5801 Toxoplasma chorioretinitis: Secondary | ICD-10-CM | POA: Diagnosis not present

## 2016-06-29 DIAGNOSIS — E119 Type 2 diabetes mellitus without complications: Secondary | ICD-10-CM | POA: Diagnosis not present

## 2016-07-28 DIAGNOSIS — E782 Mixed hyperlipidemia: Secondary | ICD-10-CM | POA: Diagnosis not present

## 2016-07-28 DIAGNOSIS — I10 Essential (primary) hypertension: Secondary | ICD-10-CM | POA: Diagnosis not present

## 2016-07-28 DIAGNOSIS — E038 Other specified hypothyroidism: Secondary | ICD-10-CM | POA: Diagnosis not present

## 2016-07-28 DIAGNOSIS — E1140 Type 2 diabetes mellitus with diabetic neuropathy, unspecified: Secondary | ICD-10-CM | POA: Diagnosis not present

## 2016-07-28 DIAGNOSIS — E668 Other obesity: Secondary | ICD-10-CM | POA: Diagnosis not present

## 2016-09-09 DIAGNOSIS — I10 Essential (primary) hypertension: Secondary | ICD-10-CM | POA: Diagnosis not present

## 2016-09-10 DIAGNOSIS — C73 Malignant neoplasm of thyroid gland: Secondary | ICD-10-CM | POA: Diagnosis not present

## 2016-11-03 DIAGNOSIS — Z6832 Body mass index (BMI) 32.0-32.9, adult: Secondary | ICD-10-CM | POA: Diagnosis not present

## 2016-11-03 DIAGNOSIS — E782 Mixed hyperlipidemia: Secondary | ICD-10-CM | POA: Diagnosis not present

## 2016-11-03 DIAGNOSIS — Z23 Encounter for immunization: Secondary | ICD-10-CM | POA: Diagnosis not present

## 2016-11-03 DIAGNOSIS — I10 Essential (primary) hypertension: Secondary | ICD-10-CM | POA: Diagnosis not present

## 2016-11-03 DIAGNOSIS — E038 Other specified hypothyroidism: Secondary | ICD-10-CM | POA: Diagnosis not present

## 2016-11-03 DIAGNOSIS — E1140 Type 2 diabetes mellitus with diabetic neuropathy, unspecified: Secondary | ICD-10-CM | POA: Diagnosis not present

## 2016-11-17 DIAGNOSIS — C73 Malignant neoplasm of thyroid gland: Secondary | ICD-10-CM | POA: Diagnosis not present

## 2016-11-17 DIAGNOSIS — E890 Postprocedural hypothyroidism: Secondary | ICD-10-CM | POA: Diagnosis not present

## 2016-11-17 DIAGNOSIS — E871 Hypo-osmolality and hyponatremia: Secondary | ICD-10-CM | POA: Diagnosis not present

## 2016-12-07 IMAGING — NM NM [ID] THYROID CANCER METS SP CA TX
6 series · 6 of 6 positions shown · non-contrast
Comparison: None.  None

CLINICAL DATA: Thyroid cancer post thyroidectomy and postoperative
radioactive iodine ablation

EXAM:
NUCLEAR MEDICINE I-UWU POST THERAPY WHOLE BODY SCAN
TECHNIQUE: The patient received 76.9 mCi I-UWU sodium iodide for the treatment
of thyroid cancer within the past 10 days. The patient returns
today, and whole body scanning was performed in the anterior and
posterior projections.

[Series 1: marker · 4.14mm/px · 1 of 1 slices shown (1 of 2)]
[im 1/1]
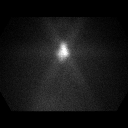

[Series 1: marker · 4.14mm/px · 1 of 1 slices shown (2 of 2)]
[im 1/1]
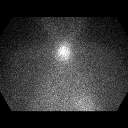

[Series 2: static thyroid no marker · 4.14mm/px · 1 of 1 slices shown (1 of 2)]
[im 1/1]
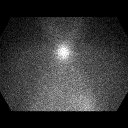

[Series 2: static thyroid no marker · 4.14mm/px · 1 of 1 slices shown (2 of 2)]
[im 1/1]
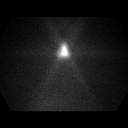

[Series 3: i131 whole body · 2.66mm/px · 1 of 1 slices shown (1 of 2)]
[im 1/1  full-range]
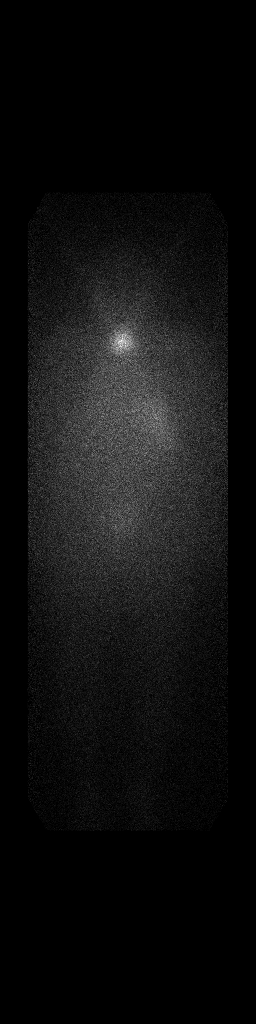

[Series 3: i131 whole body · 2.66mm/px · 1 of 1 slices shown (2 of 2)]
[im 1/1]
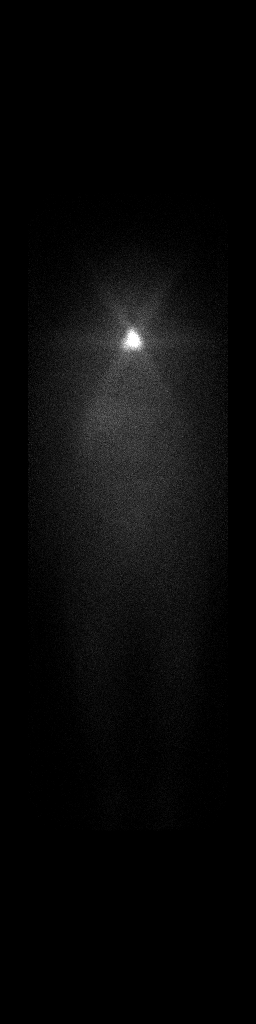

[6 of 6 positions shown; findings below may reference images not displayed]

FINDINGS: Residual radio iodine accumulation at the thyroid bed post
thyroidectomy compatible with thyroid remnant.

No distant sites of abnormal radio iodine accumulation are
identified to suggest iodine-avid metastatic thyroid cancer.
IMPRESSION: Residual radio iodine accumulation at thyroid bed consistent with
thyroid remnant.

No sites of iodine-avid metastatic thyroid cancer identified.

## 2017-01-04 DIAGNOSIS — H469 Unspecified optic neuritis: Secondary | ICD-10-CM | POA: Diagnosis not present

## 2017-01-04 DIAGNOSIS — B5801 Toxoplasma chorioretinitis: Secondary | ICD-10-CM | POA: Diagnosis not present

## 2017-01-20 DIAGNOSIS — R1032 Left lower quadrant pain: Secondary | ICD-10-CM | POA: Diagnosis not present

## 2017-01-25 DIAGNOSIS — C73 Malignant neoplasm of thyroid gland: Secondary | ICD-10-CM | POA: Diagnosis not present

## 2017-01-26 DIAGNOSIS — C73 Malignant neoplasm of thyroid gland: Secondary | ICD-10-CM | POA: Diagnosis not present

## 2017-01-29 DIAGNOSIS — C73 Malignant neoplasm of thyroid gland: Secondary | ICD-10-CM | POA: Diagnosis not present

## 2017-02-22 DIAGNOSIS — E038 Other specified hypothyroidism: Secondary | ICD-10-CM | POA: Diagnosis not present

## 2017-02-22 DIAGNOSIS — Z6832 Body mass index (BMI) 32.0-32.9, adult: Secondary | ICD-10-CM | POA: Diagnosis not present

## 2017-02-22 DIAGNOSIS — E782 Mixed hyperlipidemia: Secondary | ICD-10-CM | POA: Diagnosis not present

## 2017-02-22 DIAGNOSIS — E1140 Type 2 diabetes mellitus with diabetic neuropathy, unspecified: Secondary | ICD-10-CM | POA: Diagnosis not present

## 2017-02-22 DIAGNOSIS — I10 Essential (primary) hypertension: Secondary | ICD-10-CM | POA: Diagnosis not present

## 2017-02-25 DIAGNOSIS — H40052 Ocular hypertension, left eye: Secondary | ICD-10-CM | POA: Diagnosis not present

## 2017-02-25 DIAGNOSIS — B5801 Toxoplasma chorioretinitis: Secondary | ICD-10-CM | POA: Diagnosis not present

## 2017-03-04 DIAGNOSIS — B5801 Toxoplasma chorioretinitis: Secondary | ICD-10-CM | POA: Diagnosis not present

## 2017-03-15 DIAGNOSIS — B5801 Toxoplasma chorioretinitis: Secondary | ICD-10-CM | POA: Diagnosis not present

## 2017-04-06 DIAGNOSIS — Z1231 Encounter for screening mammogram for malignant neoplasm of breast: Secondary | ICD-10-CM | POA: Diagnosis not present

## 2017-04-08 DIAGNOSIS — C73 Malignant neoplasm of thyroid gland: Secondary | ICD-10-CM | POA: Diagnosis not present

## 2017-04-08 DIAGNOSIS — Z853 Personal history of malignant neoplasm of breast: Secondary | ICD-10-CM | POA: Diagnosis not present

## 2017-04-13 DIAGNOSIS — B5801 Toxoplasma chorioretinitis: Secondary | ICD-10-CM | POA: Diagnosis not present

## 2017-04-14 DIAGNOSIS — Z6832 Body mass index (BMI) 32.0-32.9, adult: Secondary | ICD-10-CM | POA: Diagnosis not present

## 2017-04-14 DIAGNOSIS — Z0000 Encounter for general adult medical examination without abnormal findings: Secondary | ICD-10-CM | POA: Diagnosis not present

## 2017-04-27 DIAGNOSIS — M25561 Pain in right knee: Secondary | ICD-10-CM | POA: Diagnosis not present

## 2017-04-27 DIAGNOSIS — Z96651 Presence of right artificial knee joint: Secondary | ICD-10-CM | POA: Diagnosis not present

## 2017-04-27 DIAGNOSIS — M7918 Myalgia, other site: Secondary | ICD-10-CM | POA: Diagnosis not present

## 2017-04-27 DIAGNOSIS — R202 Paresthesia of skin: Secondary | ICD-10-CM | POA: Diagnosis not present

## 2017-04-27 DIAGNOSIS — Z471 Aftercare following joint replacement surgery: Secondary | ICD-10-CM | POA: Diagnosis not present

## 2017-05-18 DIAGNOSIS — B5801 Toxoplasma chorioretinitis: Secondary | ICD-10-CM | POA: Diagnosis not present

## 2017-05-18 DIAGNOSIS — C73 Malignant neoplasm of thyroid gland: Secondary | ICD-10-CM | POA: Diagnosis not present

## 2017-05-18 DIAGNOSIS — E871 Hypo-osmolality and hyponatremia: Secondary | ICD-10-CM | POA: Diagnosis not present

## 2017-05-18 DIAGNOSIS — E890 Postprocedural hypothyroidism: Secondary | ICD-10-CM | POA: Diagnosis not present

## 2017-06-04 DIAGNOSIS — E1169 Type 2 diabetes mellitus with other specified complication: Secondary | ICD-10-CM | POA: Diagnosis not present

## 2017-06-04 DIAGNOSIS — I10 Essential (primary) hypertension: Secondary | ICD-10-CM | POA: Diagnosis not present

## 2017-06-04 DIAGNOSIS — E782 Mixed hyperlipidemia: Secondary | ICD-10-CM | POA: Diagnosis not present

## 2017-06-04 DIAGNOSIS — E038 Other specified hypothyroidism: Secondary | ICD-10-CM | POA: Diagnosis not present

## 2017-06-04 DIAGNOSIS — E1142 Type 2 diabetes mellitus with diabetic polyneuropathy: Secondary | ICD-10-CM | POA: Diagnosis not present

## 2017-06-04 DIAGNOSIS — Z6832 Body mass index (BMI) 32.0-32.9, adult: Secondary | ICD-10-CM | POA: Diagnosis not present

## 2017-06-08 DIAGNOSIS — J018 Other acute sinusitis: Secondary | ICD-10-CM | POA: Diagnosis not present

## 2017-06-15 DIAGNOSIS — H35062 Retinal vasculitis, left eye: Secondary | ICD-10-CM | POA: Diagnosis not present

## 2017-06-15 DIAGNOSIS — H5202 Hypermetropia, left eye: Secondary | ICD-10-CM | POA: Diagnosis not present

## 2017-06-15 DIAGNOSIS — H353131 Nonexudative age-related macular degeneration, bilateral, early dry stage: Secondary | ICD-10-CM | POA: Diagnosis not present

## 2017-06-15 DIAGNOSIS — H52223 Regular astigmatism, bilateral: Secondary | ICD-10-CM | POA: Diagnosis not present

## 2017-06-15 DIAGNOSIS — Z961 Presence of intraocular lens: Secondary | ICD-10-CM | POA: Diagnosis not present

## 2017-06-15 DIAGNOSIS — H5211 Myopia, right eye: Secondary | ICD-10-CM | POA: Diagnosis not present

## 2017-06-15 DIAGNOSIS — Z9842 Cataract extraction status, left eye: Secondary | ICD-10-CM | POA: Diagnosis not present

## 2017-06-15 DIAGNOSIS — E119 Type 2 diabetes mellitus without complications: Secondary | ICD-10-CM | POA: Diagnosis not present

## 2017-06-15 DIAGNOSIS — H524 Presbyopia: Secondary | ICD-10-CM | POA: Diagnosis not present

## 2017-06-15 DIAGNOSIS — Z9841 Cataract extraction status, right eye: Secondary | ICD-10-CM | POA: Diagnosis not present

## 2017-06-15 DIAGNOSIS — Z7984 Long term (current) use of oral hypoglycemic drugs: Secondary | ICD-10-CM | POA: Diagnosis not present

## 2017-06-29 DIAGNOSIS — B5801 Toxoplasma chorioretinitis: Secondary | ICD-10-CM | POA: Diagnosis not present

## 2017-07-01 DIAGNOSIS — I10 Essential (primary) hypertension: Secondary | ICD-10-CM | POA: Diagnosis not present

## 2017-07-01 DIAGNOSIS — R944 Abnormal results of kidney function studies: Secondary | ICD-10-CM | POA: Diagnosis not present

## 2017-07-01 DIAGNOSIS — D72829 Elevated white blood cell count, unspecified: Secondary | ICD-10-CM | POA: Diagnosis not present

## 2017-07-06 ENCOUNTER — Other Ambulatory Visit: Payer: Self-pay | Admitting: Podiatry

## 2017-07-06 ENCOUNTER — Ambulatory Visit (INDEPENDENT_AMBULATORY_CARE_PROVIDER_SITE_OTHER): Payer: Medicare Other

## 2017-07-06 ENCOUNTER — Ambulatory Visit (INDEPENDENT_AMBULATORY_CARE_PROVIDER_SITE_OTHER): Payer: Medicare Other | Admitting: Podiatry

## 2017-07-06 DIAGNOSIS — M21611 Bunion of right foot: Secondary | ICD-10-CM

## 2017-07-06 DIAGNOSIS — M2041 Other hammer toe(s) (acquired), right foot: Secondary | ICD-10-CM

## 2017-07-06 DIAGNOSIS — M2011 Hallux valgus (acquired), right foot: Secondary | ICD-10-CM

## 2017-07-06 DIAGNOSIS — M779 Enthesopathy, unspecified: Secondary | ICD-10-CM

## 2017-07-06 DIAGNOSIS — M2042 Other hammer toe(s) (acquired), left foot: Principal | ICD-10-CM

## 2017-07-06 NOTE — Progress Notes (Signed)
  Subjective:  Patient ID: Amy Mccann, female    DOB: 1940/09/25,  MRN: [id number]  Chief Complaint  Patient presents with  . Mass    R great toe joint x 2 wks; very painful Tx: first aid oitment Pt. stated,"it feels like a corn but it aches a lot."   . Callouses    L 5th toe x few days: very painful Tx: first aid ointment -ABN signed    77 y.o. female presents with the above complaint. Reports painful "mass" to the R foot and callus to the L 5th toe. Has been applying abx ointment and band-aid. Denies other pedal issues. No past medical history on file.   Current Outpatient Medications:  .  omeprazole (PRILOSEC) 20 MG capsule, Take 20 mg by mouth daily., Disp: , Rfl:   Not on File Review of Systems: Negative except as noted in the HPI. Denies N/V/F/Ch. Objective:  There were no vitals filed for this visit. General AA&O x3. Normal mood and affect.  Vascular Dorsalis pedis and posterior tibial pulses  present 2+ bilaterally  Capillary refill normal to all digits. Pedal hair growth normal.  Neurologic Epicritic sensation grossly present.  Dermatologic No open lesions. Interspaces clear of maceration. Nails well groomed and normal in appearance.  Orthopedic: MMT 5/5 in dorsiflexion, plantarflexion, inversion, and eversion. Severe abduction R hallux with prominent dorsomedial bunion. Lesser digital contractures bilat, L 5th hammertoe present.   Assessment & Plan:  Patient was evaluated and treated and all questions answered.  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 No acute fractures. Severe HAV deformity R -Discussed conservative vs surgical management of above deformitties. Will proceed with conservative therapy for now. Bunion shield and hammertoe cushions dispensed. -F/u in 6 weeks. Should pain persist would consider surgical discussion of the above deformities.  Return in about 6 weeks (around 08/17/2017) for Corn, bunion f.u.

## 2017-07-06 NOTE — Addendum Note (Signed)
Addended by: Allean Found on: 07/06/2017 04:44 PM   Modules accepted: Orders

## 2017-07-12 ENCOUNTER — Ambulatory Visit (INDEPENDENT_AMBULATORY_CARE_PROVIDER_SITE_OTHER): Payer: Medicare Other | Admitting: Podiatry

## 2017-07-12 DIAGNOSIS — M2042 Other hammer toe(s) (acquired), left foot: Secondary | ICD-10-CM | POA: Diagnosis not present

## 2017-07-12 DIAGNOSIS — M2041 Other hammer toe(s) (acquired), right foot: Secondary | ICD-10-CM | POA: Diagnosis not present

## 2017-07-12 DIAGNOSIS — M21611 Bunion of right foot: Secondary | ICD-10-CM | POA: Diagnosis not present

## 2017-07-12 DIAGNOSIS — M7751 Other enthesopathy of right foot: Secondary | ICD-10-CM

## 2017-07-12 DIAGNOSIS — M2011 Hallux valgus (acquired), right foot: Secondary | ICD-10-CM

## 2017-07-12 NOTE — Progress Notes (Signed)
  Subjective:  Patient ID: Amy Mccann, female    DOB: [birth year],  MRN: [id number]  No chief complaint on file.  77 y.o. female returns for the above complaint. States the pain is doing a litle better. Had sharp pain in her R foot. Has been wearing more open toed shoes which help with the pain.  Objective:  There were no vitals filed for this visit. General AA&O x3. Normal mood and affect.  Vascular Pedal pulses palpable.  Neurologic Epicritic sensation grossly intact.  Dermatologic No open lesions. Skin normal texture and turgor.  Orthopedic: POP R 1st MPJ, L 5th toe PIPJ. HAV right, adductovarus HT L   Assessment & Plan:  Patient was evaluated and treated and all questions answered.  HAV R -Injection as below. -Should pain persist would consider surgical intervention.  5th Hammertoe L -Should pain persist would consider surgical intervention. -Corn pads dispensed.  No follow-ups on file.

## 2017-08-17 DIAGNOSIS — B589 Toxoplasmosis, unspecified: Secondary | ICD-10-CM | POA: Diagnosis not present

## 2017-08-24 ENCOUNTER — Ambulatory Visit (INDEPENDENT_AMBULATORY_CARE_PROVIDER_SITE_OTHER): Payer: Medicare Other | Admitting: Podiatry

## 2017-08-24 ENCOUNTER — Ambulatory Visit: Payer: Medicare Other | Admitting: Podiatry

## 2017-08-24 DIAGNOSIS — M21611 Bunion of right foot: Secondary | ICD-10-CM

## 2017-08-24 DIAGNOSIS — M2041 Other hammer toe(s) (acquired), right foot: Secondary | ICD-10-CM

## 2017-08-24 DIAGNOSIS — M2042 Other hammer toe(s) (acquired), left foot: Secondary | ICD-10-CM

## 2017-08-24 DIAGNOSIS — B351 Tinea unguium: Secondary | ICD-10-CM

## 2017-08-24 DIAGNOSIS — M2011 Hallux valgus (acquired), right foot: Secondary | ICD-10-CM

## 2017-08-24 DIAGNOSIS — E1151 Type 2 diabetes mellitus with diabetic peripheral angiopathy without gangrene: Secondary | ICD-10-CM | POA: Diagnosis not present

## 2017-08-24 NOTE — Progress Notes (Signed)
  Subjective:  Patient ID: Amy Mccann, female    DOB: [birth year],  MRN: [id number]  Chief Complaint  Patient presents with  . hammer toes    F/U L 5th Pt. stasted," it's alright  . hav    R Hav Pt. stated," it feels much better."   . debride    Nail trimming -FBS: 116 A1C: 6.1 PCP: [facility]  LOV: x 3 mo   77 y.o. female returns for diabetic foot care.  States the hammertoes and bunion feel much better.  Complains of elongated toenails that she would like.  Last A1c of 6.1 last and blood sugar 116 lesser her PCP Dr. Tobie Poet 3 months ago.  Objective:   General AA&O x3. Normal mood and affect.  Vascular Dorsalis pedis pulses present 1+ bilaterally  Posterior tibial pulses absent bilaterally  Capillary refill normal to all digits. Pedal hair growth normal.  Neurologic Epicritic sensation present bilaterally. Protective sensation with 5.07 monofilament  present bilaterally. Vibratory sensation present bilaterally.  Dermatologic No open lesions. Interspaces clear of maceration.  Normal skin temperature and turgor. Hyperkeratotic lesions: None bilaterally. Nails: brittle, onychomycosis, thickening, elongation  Orthopedic: No history of amputation. MMT 5/5 in dorsiflexion, plantarflexion, inversion, and eversion. Normal lower extremity joint ROM without pain or crepitus. HAV R, Hammertoes bilat without POP.    Assessment & Plan:  Patient was evaluated and treated and all questions answered.  HAV R, Hammertoe 5th Toe Left -Doing well post injection.  No issues today.  Diabetes with PAD, Onychomycosis -Educated on diabetic footcare. Diabetic risk level 1 -At risk foot care provided as below.  Procedure: Nail Debridement Rationale: Patient meets criteria for routine foot care due to PAD Type of Debridement: manual, sharp debridement. Instrumentation: Nail nipper, rotary burr. Number of Nails: 10  Return if symptoms worsen or fail to improve.

## 2017-09-14 DIAGNOSIS — E782 Mixed hyperlipidemia: Secondary | ICD-10-CM | POA: Diagnosis not present

## 2017-09-14 DIAGNOSIS — E1142 Type 2 diabetes mellitus with diabetic polyneuropathy: Secondary | ICD-10-CM | POA: Diagnosis not present

## 2017-09-14 DIAGNOSIS — I10 Essential (primary) hypertension: Secondary | ICD-10-CM | POA: Diagnosis not present

## 2017-09-14 DIAGNOSIS — F3170 Bipolar disorder, currently in remission, most recent episode unspecified: Secondary | ICD-10-CM | POA: Diagnosis not present

## 2017-09-14 DIAGNOSIS — E038 Other specified hypothyroidism: Secondary | ICD-10-CM | POA: Diagnosis not present

## 2017-09-14 DIAGNOSIS — Z6832 Body mass index (BMI) 32.0-32.9, adult: Secondary | ICD-10-CM | POA: Diagnosis not present

## 2017-10-14 DIAGNOSIS — R1013 Epigastric pain: Secondary | ICD-10-CM | POA: Diagnosis not present

## 2017-10-25 DIAGNOSIS — R1013 Epigastric pain: Secondary | ICD-10-CM | POA: Diagnosis not present

## 2017-10-27 DIAGNOSIS — R1013 Epigastric pain: Secondary | ICD-10-CM | POA: Diagnosis not present

## 2017-10-27 DIAGNOSIS — R109 Unspecified abdominal pain: Secondary | ICD-10-CM | POA: Diagnosis not present

## 2017-10-27 DIAGNOSIS — E119 Type 2 diabetes mellitus without complications: Secondary | ICD-10-CM | POA: Diagnosis not present

## 2017-10-27 DIAGNOSIS — Z880 Allergy status to penicillin: Secondary | ICD-10-CM | POA: Diagnosis not present

## 2017-10-27 DIAGNOSIS — Z79899 Other long term (current) drug therapy: Secondary | ICD-10-CM | POA: Diagnosis not present

## 2017-10-27 DIAGNOSIS — E785 Hyperlipidemia, unspecified: Secondary | ICD-10-CM | POA: Diagnosis not present

## 2017-10-27 DIAGNOSIS — K219 Gastro-esophageal reflux disease without esophagitis: Secondary | ICD-10-CM | POA: Diagnosis not present

## 2017-10-27 DIAGNOSIS — R112 Nausea with vomiting, unspecified: Secondary | ICD-10-CM | POA: Diagnosis not present

## 2017-11-03 DIAGNOSIS — R1013 Epigastric pain: Secondary | ICD-10-CM | POA: Diagnosis not present

## 2017-11-08 DIAGNOSIS — Z23 Encounter for immunization: Secondary | ICD-10-CM | POA: Diagnosis not present

## 2017-11-08 DIAGNOSIS — R1084 Generalized abdominal pain: Secondary | ICD-10-CM | POA: Diagnosis not present

## 2017-12-17 DIAGNOSIS — E1142 Type 2 diabetes mellitus with diabetic polyneuropathy: Secondary | ICD-10-CM | POA: Diagnosis not present

## 2017-12-17 DIAGNOSIS — E6609 Other obesity due to excess calories: Secondary | ICD-10-CM | POA: Diagnosis not present

## 2017-12-17 DIAGNOSIS — Z6832 Body mass index (BMI) 32.0-32.9, adult: Secondary | ICD-10-CM | POA: Diagnosis not present

## 2017-12-17 DIAGNOSIS — E782 Mixed hyperlipidemia: Secondary | ICD-10-CM | POA: Diagnosis not present

## 2017-12-17 DIAGNOSIS — I10 Essential (primary) hypertension: Secondary | ICD-10-CM | POA: Diagnosis not present

## 2017-12-17 DIAGNOSIS — M25561 Pain in right knee: Secondary | ICD-10-CM | POA: Diagnosis not present

## 2017-12-22 DIAGNOSIS — E871 Hypo-osmolality and hyponatremia: Secondary | ICD-10-CM | POA: Diagnosis not present

## 2017-12-22 DIAGNOSIS — E890 Postprocedural hypothyroidism: Secondary | ICD-10-CM | POA: Diagnosis not present

## 2017-12-22 DIAGNOSIS — C73 Malignant neoplasm of thyroid gland: Secondary | ICD-10-CM | POA: Diagnosis not present

## 2018-02-15 DIAGNOSIS — B581 Toxoplasma hepatitis: Secondary | ICD-10-CM | POA: Diagnosis not present

## 2018-03-02 DIAGNOSIS — E119 Type 2 diabetes mellitus without complications: Secondary | ICD-10-CM | POA: Diagnosis not present

## 2018-03-02 DIAGNOSIS — R1084 Generalized abdominal pain: Secondary | ICD-10-CM | POA: Diagnosis not present

## 2018-03-24 DIAGNOSIS — K219 Gastro-esophageal reflux disease without esophagitis: Secondary | ICD-10-CM | POA: Diagnosis not present

## 2018-03-24 DIAGNOSIS — L304 Erythema intertrigo: Secondary | ICD-10-CM | POA: Diagnosis not present

## 2018-03-24 DIAGNOSIS — I10 Essential (primary) hypertension: Secondary | ICD-10-CM | POA: Diagnosis not present

## 2018-03-24 DIAGNOSIS — E782 Mixed hyperlipidemia: Secondary | ICD-10-CM | POA: Diagnosis not present

## 2018-03-24 DIAGNOSIS — E1142 Type 2 diabetes mellitus with diabetic polyneuropathy: Secondary | ICD-10-CM | POA: Diagnosis not present

## 2018-03-24 DIAGNOSIS — E6609 Other obesity due to excess calories: Secondary | ICD-10-CM | POA: Diagnosis not present

## 2018-03-24 DIAGNOSIS — Z6832 Body mass index (BMI) 32.0-32.9, adult: Secondary | ICD-10-CM | POA: Diagnosis not present

## 2018-04-07 DIAGNOSIS — R109 Unspecified abdominal pain: Secondary | ICD-10-CM | POA: Diagnosis not present

## 2018-04-07 DIAGNOSIS — Z853 Personal history of malignant neoplasm of breast: Secondary | ICD-10-CM | POA: Diagnosis not present

## 2018-04-07 DIAGNOSIS — R112 Nausea with vomiting, unspecified: Secondary | ICD-10-CM | POA: Diagnosis not present

## 2018-04-20 DIAGNOSIS — K219 Gastro-esophageal reflux disease without esophagitis: Secondary | ICD-10-CM | POA: Diagnosis not present

## 2018-04-20 DIAGNOSIS — R1013 Epigastric pain: Secondary | ICD-10-CM | POA: Diagnosis not present

## 2018-04-20 DIAGNOSIS — R110 Nausea: Secondary | ICD-10-CM | POA: Diagnosis not present

## 2018-04-21 DIAGNOSIS — Z0000 Encounter for general adult medical examination without abnormal findings: Secondary | ICD-10-CM | POA: Diagnosis not present

## 2018-04-21 DIAGNOSIS — Z6832 Body mass index (BMI) 32.0-32.9, adult: Secondary | ICD-10-CM | POA: Diagnosis not present

## 2018-06-03 DIAGNOSIS — M62830 Muscle spasm of back: Secondary | ICD-10-CM | POA: Diagnosis not present

## 2018-06-24 DIAGNOSIS — E782 Mixed hyperlipidemia: Secondary | ICD-10-CM | POA: Diagnosis not present

## 2018-06-24 DIAGNOSIS — I10 Essential (primary) hypertension: Secondary | ICD-10-CM | POA: Diagnosis not present

## 2018-06-24 DIAGNOSIS — M545 Low back pain: Secondary | ICD-10-CM | POA: Diagnosis not present

## 2018-06-24 DIAGNOSIS — Z6830 Body mass index (BMI) 30.0-30.9, adult: Secondary | ICD-10-CM | POA: Diagnosis not present

## 2018-06-24 DIAGNOSIS — E1142 Type 2 diabetes mellitus with diabetic polyneuropathy: Secondary | ICD-10-CM | POA: Diagnosis not present

## 2018-06-28 DIAGNOSIS — M2560 Stiffness of unspecified joint, not elsewhere classified: Secondary | ICD-10-CM | POA: Diagnosis not present

## 2018-06-28 DIAGNOSIS — M6281 Muscle weakness (generalized): Secondary | ICD-10-CM | POA: Diagnosis not present

## 2018-06-28 DIAGNOSIS — M545 Low back pain: Secondary | ICD-10-CM | POA: Diagnosis not present

## 2018-06-30 DIAGNOSIS — M545 Low back pain: Secondary | ICD-10-CM | POA: Diagnosis not present

## 2018-06-30 DIAGNOSIS — M6281 Muscle weakness (generalized): Secondary | ICD-10-CM | POA: Diagnosis not present

## 2018-06-30 DIAGNOSIS — M2560 Stiffness of unspecified joint, not elsewhere classified: Secondary | ICD-10-CM | POA: Diagnosis not present

## 2018-07-06 DIAGNOSIS — M6281 Muscle weakness (generalized): Secondary | ICD-10-CM | POA: Diagnosis not present

## 2018-07-06 DIAGNOSIS — M545 Low back pain: Secondary | ICD-10-CM | POA: Diagnosis not present

## 2018-07-06 DIAGNOSIS — M2560 Stiffness of unspecified joint, not elsewhere classified: Secondary | ICD-10-CM | POA: Diagnosis not present

## 2018-07-08 DIAGNOSIS — M2560 Stiffness of unspecified joint, not elsewhere classified: Secondary | ICD-10-CM | POA: Diagnosis not present

## 2018-07-08 DIAGNOSIS — M545 Low back pain: Secondary | ICD-10-CM | POA: Diagnosis not present

## 2018-07-08 DIAGNOSIS — M6281 Muscle weakness (generalized): Secondary | ICD-10-CM | POA: Diagnosis not present

## 2018-07-11 DIAGNOSIS — E890 Postprocedural hypothyroidism: Secondary | ICD-10-CM | POA: Diagnosis not present

## 2018-07-11 DIAGNOSIS — C73 Malignant neoplasm of thyroid gland: Secondary | ICD-10-CM | POA: Diagnosis not present

## 2018-07-11 DIAGNOSIS — E871 Hypo-osmolality and hyponatremia: Secondary | ICD-10-CM | POA: Diagnosis not present

## 2018-07-13 DIAGNOSIS — M545 Low back pain: Secondary | ICD-10-CM | POA: Diagnosis not present

## 2018-07-13 DIAGNOSIS — M2560 Stiffness of unspecified joint, not elsewhere classified: Secondary | ICD-10-CM | POA: Diagnosis not present

## 2018-07-13 DIAGNOSIS — M6281 Muscle weakness (generalized): Secondary | ICD-10-CM | POA: Diagnosis not present

## 2018-07-14 DIAGNOSIS — C73 Malignant neoplasm of thyroid gland: Secondary | ICD-10-CM | POA: Diagnosis not present

## 2018-07-14 DIAGNOSIS — Z85850 Personal history of malignant neoplasm of thyroid: Secondary | ICD-10-CM | POA: Diagnosis not present

## 2018-07-14 DIAGNOSIS — E890 Postprocedural hypothyroidism: Secondary | ICD-10-CM | POA: Diagnosis not present

## 2018-07-15 DIAGNOSIS — M2560 Stiffness of unspecified joint, not elsewhere classified: Secondary | ICD-10-CM | POA: Diagnosis not present

## 2018-07-15 DIAGNOSIS — M6281 Muscle weakness (generalized): Secondary | ICD-10-CM | POA: Diagnosis not present

## 2018-07-15 DIAGNOSIS — M545 Low back pain: Secondary | ICD-10-CM | POA: Diagnosis not present

## 2018-07-19 DIAGNOSIS — M6281 Muscle weakness (generalized): Secondary | ICD-10-CM | POA: Diagnosis not present

## 2018-07-19 DIAGNOSIS — M2560 Stiffness of unspecified joint, not elsewhere classified: Secondary | ICD-10-CM | POA: Diagnosis not present

## 2018-07-19 DIAGNOSIS — M545 Low back pain: Secondary | ICD-10-CM | POA: Diagnosis not present

## 2018-07-21 DIAGNOSIS — M2560 Stiffness of unspecified joint, not elsewhere classified: Secondary | ICD-10-CM | POA: Diagnosis not present

## 2018-07-21 DIAGNOSIS — M6281 Muscle weakness (generalized): Secondary | ICD-10-CM | POA: Diagnosis not present

## 2018-07-21 DIAGNOSIS — M545 Low back pain: Secondary | ICD-10-CM | POA: Diagnosis not present

## 2018-07-26 DIAGNOSIS — M2560 Stiffness of unspecified joint, not elsewhere classified: Secondary | ICD-10-CM | POA: Diagnosis not present

## 2018-07-26 DIAGNOSIS — M6281 Muscle weakness (generalized): Secondary | ICD-10-CM | POA: Diagnosis not present

## 2018-07-26 DIAGNOSIS — M545 Low back pain: Secondary | ICD-10-CM | POA: Diagnosis not present

## 2018-07-28 DIAGNOSIS — M545 Low back pain: Secondary | ICD-10-CM | POA: Diagnosis not present

## 2018-07-28 DIAGNOSIS — M2560 Stiffness of unspecified joint, not elsewhere classified: Secondary | ICD-10-CM | POA: Diagnosis not present

## 2018-07-28 DIAGNOSIS — M6281 Muscle weakness (generalized): Secondary | ICD-10-CM | POA: Diagnosis not present

## 2018-08-03 DIAGNOSIS — M2560 Stiffness of unspecified joint, not elsewhere classified: Secondary | ICD-10-CM | POA: Diagnosis not present

## 2018-08-03 DIAGNOSIS — M6281 Muscle weakness (generalized): Secondary | ICD-10-CM | POA: Diagnosis not present

## 2018-08-03 DIAGNOSIS — M545 Low back pain: Secondary | ICD-10-CM | POA: Diagnosis not present

## 2018-08-05 DIAGNOSIS — Z1231 Encounter for screening mammogram for malignant neoplasm of breast: Secondary | ICD-10-CM | POA: Diagnosis not present

## 2018-08-05 DIAGNOSIS — M810 Age-related osteoporosis without current pathological fracture: Secondary | ICD-10-CM | POA: Diagnosis not present

## 2018-08-05 DIAGNOSIS — M85852 Other specified disorders of bone density and structure, left thigh: Secondary | ICD-10-CM | POA: Diagnosis not present

## 2018-08-10 DIAGNOSIS — M6281 Muscle weakness (generalized): Secondary | ICD-10-CM | POA: Diagnosis not present

## 2018-08-10 DIAGNOSIS — M2560 Stiffness of unspecified joint, not elsewhere classified: Secondary | ICD-10-CM | POA: Diagnosis not present

## 2018-08-10 DIAGNOSIS — M545 Low back pain: Secondary | ICD-10-CM | POA: Diagnosis not present

## 2018-09-01 ENCOUNTER — Encounter: Payer: Self-pay | Admitting: Sports Medicine

## 2018-09-01 ENCOUNTER — Ambulatory Visit (INDEPENDENT_AMBULATORY_CARE_PROVIDER_SITE_OTHER): Payer: Medicare HMO | Admitting: Sports Medicine

## 2018-09-01 ENCOUNTER — Other Ambulatory Visit: Payer: Self-pay | Admitting: Sports Medicine

## 2018-09-01 ENCOUNTER — Other Ambulatory Visit: Payer: Self-pay

## 2018-09-01 VITALS — Temp 97.6°F | Resp 16

## 2018-09-01 DIAGNOSIS — M2042 Other hammer toe(s) (acquired), left foot: Secondary | ICD-10-CM

## 2018-09-01 DIAGNOSIS — B351 Tinea unguium: Secondary | ICD-10-CM

## 2018-09-01 DIAGNOSIS — M2041 Other hammer toe(s) (acquired), right foot: Secondary | ICD-10-CM

## 2018-09-01 DIAGNOSIS — M2141 Flat foot [pes planus] (acquired), right foot: Secondary | ICD-10-CM

## 2018-09-01 DIAGNOSIS — E1151 Type 2 diabetes mellitus with diabetic peripheral angiopathy without gangrene: Secondary | ICD-10-CM

## 2018-09-01 DIAGNOSIS — M79675 Pain in left toe(s): Secondary | ICD-10-CM

## 2018-09-01 DIAGNOSIS — M2142 Flat foot [pes planus] (acquired), left foot: Secondary | ICD-10-CM

## 2018-09-01 DIAGNOSIS — M79674 Pain in right toe(s): Secondary | ICD-10-CM | POA: Diagnosis not present

## 2018-09-01 DIAGNOSIS — M21611 Bunion of right foot: Secondary | ICD-10-CM

## 2018-09-01 DIAGNOSIS — M2011 Hallux valgus (acquired), right foot: Secondary | ICD-10-CM

## 2018-09-01 DIAGNOSIS — M79671 Pain in right foot: Secondary | ICD-10-CM

## 2018-09-01 NOTE — Progress Notes (Signed)
Subjective: Amy Mccann is a 78 y.o. female patient with history of diabetes who presents to office today complaining of long,mildly painful nails  while ambulating in shoes; unable to trim. Patient states that the glucose reading this morning was135mg /dl. A1c 6.2. PCP [facility] seen last6 month. Patient denies any new changes in medication or new problems.  Patient is interested in Diabetic shoes.   There are no active problems to display for this patient.  Current Outpatient Medications on File Prior to Visit  Medication Sig Dispense Refill  . levothyroxine (SYNTHROID) 112 MCG tablet TAKE 1 TABLET BY MOUTH DAILY    . omeprazole (PRILOSEC) 20 MG capsule Take 20 mg by mouth daily.     No current facility-administered medications on file prior to visit.    Allergies  Allergen Reactions  . Codeine Itching and Rash  . Penicillins Rash    No results found for this or any previous visit (from the past 2160 hour(s)).  Objective: General: Patient is awake, alert, and oriented x 3 and in no acute distress.  Integument: Skin is warm, dry and supple bilateral. Nails are tender, long, thickened and dystrophic with subungual debris, consistent with onychomycosis, 1-5 bilateral. No signs of infection.  Minimal keratosis noted bilateral hallux and right second and fourth toes distal tuft.  No open lesions or preulcerative lesions present bilateral. Remaining integument unremarkable.  Vasculature:  Dorsalis Pedis pulse 1/4 bilateral. Posterior Tibial pulse  1/4 bilateral. Capillary fill time <3 sec 1-5 bilateral. Positive hair growth to the level of the digits. Temperature gradient within normal limits. No varicosities present bilateral. No edema present bilateral.   Neurology: The patient has intact sensation measured with a 5.07/10g Semmes Weinstein Monofilament at all pedal sites bilateral . Vibratory sensation diminished bilateral with tuning fork. No Babinski sign present bilateral.    Musculoskeletal: Asymptomatic bunion and hammertoe pedal deformities noted bilateral.  Pes planus foot type.  Muscular strength 5/5 in all lower extremity muscular groups bilateral without pain on range of motion . No tenderness with calf compression bilateral.  Assessment and Plan: Problem List Items Addressed This Visit    None    Visit Diagnoses    Pain due to onychomycosis of toenails of both feet    -  Primary   Hallux valgus with bunions of right foot       Acquired hammertoes of both feet       Diabetes mellitus type 2 with peripheral artery disease (HCC)       Pes planus of both feet          -Examined patient. -Discussed and educated patient on diabetic foot care, especially with  regards to the vascular, neurological and musculoskeletal systems.  -Stressed the importance of good glycemic control and the detriment of not  controlling glucose levels in relation to the foot. -Mechanically debrided all nails 1-5 bilateral using sterile nail nipper and filed with dremel without incident  -Dispensed bunion shield using tube foam and toe spacers [employer] diabetic shoe order form was completed; office to contact primary care for approval / certification;  Office to arrange shoe fitting and dispensing. -Answered all patient questions -Patient to return  in 3 months for at risk foot care -Patient advised to call the office if any problems or questions arise in the meantime.  Landis Martins, DPM

## 2018-09-29 DIAGNOSIS — E782 Mixed hyperlipidemia: Secondary | ICD-10-CM | POA: Diagnosis not present

## 2018-09-29 DIAGNOSIS — Z6830 Body mass index (BMI) 30.0-30.9, adult: Secondary | ICD-10-CM | POA: Diagnosis not present

## 2018-09-29 DIAGNOSIS — E1142 Type 2 diabetes mellitus with diabetic polyneuropathy: Secondary | ICD-10-CM | POA: Diagnosis not present

## 2018-09-29 DIAGNOSIS — I10 Essential (primary) hypertension: Secondary | ICD-10-CM | POA: Diagnosis not present

## 2018-09-29 DIAGNOSIS — M545 Low back pain: Secondary | ICD-10-CM | POA: Diagnosis not present

## 2018-10-24 DIAGNOSIS — M5489 Other dorsalgia: Secondary | ICD-10-CM | POA: Diagnosis not present

## 2018-10-24 DIAGNOSIS — M6281 Muscle weakness (generalized): Secondary | ICD-10-CM | POA: Diagnosis not present

## 2018-10-24 DIAGNOSIS — M2560 Stiffness of unspecified joint, not elsewhere classified: Secondary | ICD-10-CM | POA: Diagnosis not present

## 2018-10-27 DIAGNOSIS — M5489 Other dorsalgia: Secondary | ICD-10-CM | POA: Diagnosis not present

## 2018-10-27 DIAGNOSIS — M6281 Muscle weakness (generalized): Secondary | ICD-10-CM | POA: Diagnosis not present

## 2018-10-27 DIAGNOSIS — M2560 Stiffness of unspecified joint, not elsewhere classified: Secondary | ICD-10-CM | POA: Diagnosis not present

## 2018-10-31 DIAGNOSIS — M2560 Stiffness of unspecified joint, not elsewhere classified: Secondary | ICD-10-CM | POA: Diagnosis not present

## 2018-10-31 DIAGNOSIS — M6281 Muscle weakness (generalized): Secondary | ICD-10-CM | POA: Diagnosis not present

## 2018-10-31 DIAGNOSIS — M5489 Other dorsalgia: Secondary | ICD-10-CM | POA: Diagnosis not present

## 2018-11-03 DIAGNOSIS — M2560 Stiffness of unspecified joint, not elsewhere classified: Secondary | ICD-10-CM | POA: Diagnosis not present

## 2018-11-03 DIAGNOSIS — M5489 Other dorsalgia: Secondary | ICD-10-CM | POA: Diagnosis not present

## 2018-11-03 DIAGNOSIS — M6281 Muscle weakness (generalized): Secondary | ICD-10-CM | POA: Diagnosis not present

## 2018-11-09 DIAGNOSIS — M6281 Muscle weakness (generalized): Secondary | ICD-10-CM | POA: Diagnosis not present

## 2018-11-09 DIAGNOSIS — M5489 Other dorsalgia: Secondary | ICD-10-CM | POA: Diagnosis not present

## 2018-11-09 DIAGNOSIS — M2560 Stiffness of unspecified joint, not elsewhere classified: Secondary | ICD-10-CM | POA: Diagnosis not present

## 2018-11-16 DIAGNOSIS — M6281 Muscle weakness (generalized): Secondary | ICD-10-CM | POA: Diagnosis not present

## 2018-11-16 DIAGNOSIS — M5489 Other dorsalgia: Secondary | ICD-10-CM | POA: Diagnosis not present

## 2018-11-16 DIAGNOSIS — M2560 Stiffness of unspecified joint, not elsewhere classified: Secondary | ICD-10-CM | POA: Diagnosis not present

## 2018-11-23 DIAGNOSIS — M6281 Muscle weakness (generalized): Secondary | ICD-10-CM | POA: Diagnosis not present

## 2018-11-23 DIAGNOSIS — M5489 Other dorsalgia: Secondary | ICD-10-CM | POA: Diagnosis not present

## 2018-11-23 DIAGNOSIS — M2560 Stiffness of unspecified joint, not elsewhere classified: Secondary | ICD-10-CM | POA: Diagnosis not present

## 2018-11-30 ENCOUNTER — Ambulatory Visit: Payer: Medicare HMO | Admitting: Sports Medicine

## 2018-11-30 DIAGNOSIS — M545 Low back pain: Secondary | ICD-10-CM | POA: Diagnosis not present

## 2018-11-30 DIAGNOSIS — M2560 Stiffness of unspecified joint, not elsewhere classified: Secondary | ICD-10-CM | POA: Diagnosis not present

## 2018-11-30 DIAGNOSIS — M5489 Other dorsalgia: Secondary | ICD-10-CM | POA: Diagnosis not present

## 2018-11-30 DIAGNOSIS — M6281 Muscle weakness (generalized): Secondary | ICD-10-CM | POA: Diagnosis not present

## 2018-12-09 DIAGNOSIS — M6281 Muscle weakness (generalized): Secondary | ICD-10-CM | POA: Diagnosis not present

## 2018-12-09 DIAGNOSIS — M5489 Other dorsalgia: Secondary | ICD-10-CM | POA: Diagnosis not present

## 2018-12-09 DIAGNOSIS — M2560 Stiffness of unspecified joint, not elsewhere classified: Secondary | ICD-10-CM | POA: Diagnosis not present

## 2018-12-14 DIAGNOSIS — M6281 Muscle weakness (generalized): Secondary | ICD-10-CM | POA: Diagnosis not present

## 2018-12-14 DIAGNOSIS — M5489 Other dorsalgia: Secondary | ICD-10-CM | POA: Diagnosis not present

## 2018-12-14 DIAGNOSIS — M2560 Stiffness of unspecified joint, not elsewhere classified: Secondary | ICD-10-CM | POA: Diagnosis not present

## 2018-12-20 DIAGNOSIS — M545 Low back pain: Secondary | ICD-10-CM | POA: Diagnosis not present

## 2019-01-02 DIAGNOSIS — M542 Cervicalgia: Secondary | ICD-10-CM | POA: Diagnosis not present

## 2019-01-02 DIAGNOSIS — E782 Mixed hyperlipidemia: Secondary | ICD-10-CM | POA: Diagnosis not present

## 2019-01-02 DIAGNOSIS — Z6830 Body mass index (BMI) 30.0-30.9, adult: Secondary | ICD-10-CM | POA: Diagnosis not present

## 2019-01-02 DIAGNOSIS — I10 Essential (primary) hypertension: Secondary | ICD-10-CM | POA: Diagnosis not present

## 2019-01-02 DIAGNOSIS — M545 Low back pain: Secondary | ICD-10-CM | POA: Diagnosis not present

## 2019-01-02 DIAGNOSIS — E1169 Type 2 diabetes mellitus with other specified complication: Secondary | ICD-10-CM | POA: Diagnosis not present

## 2019-01-02 DIAGNOSIS — E1142 Type 2 diabetes mellitus with diabetic polyneuropathy: Secondary | ICD-10-CM | POA: Diagnosis not present

## 2019-01-10 DIAGNOSIS — M545 Low back pain: Secondary | ICD-10-CM | POA: Diagnosis not present

## 2019-01-13 DIAGNOSIS — M5489 Other dorsalgia: Secondary | ICD-10-CM | POA: Diagnosis not present

## 2019-01-13 DIAGNOSIS — M2560 Stiffness of unspecified joint, not elsewhere classified: Secondary | ICD-10-CM | POA: Diagnosis not present

## 2019-01-13 DIAGNOSIS — M6281 Muscle weakness (generalized): Secondary | ICD-10-CM | POA: Diagnosis not present

## 2019-01-20 DIAGNOSIS — Z20828 Contact with and (suspected) exposure to other viral communicable diseases: Secondary | ICD-10-CM | POA: Diagnosis not present

## 2019-01-20 DIAGNOSIS — J3489 Other specified disorders of nose and nasal sinuses: Secondary | ICD-10-CM | POA: Diagnosis not present

## 2019-02-21 DIAGNOSIS — M47816 Spondylosis without myelopathy or radiculopathy, lumbar region: Secondary | ICD-10-CM | POA: Insufficient documentation

## 2019-02-21 DIAGNOSIS — Z6832 Body mass index (BMI) 32.0-32.9, adult: Secondary | ICD-10-CM | POA: Diagnosis not present

## 2019-02-21 DIAGNOSIS — R030 Elevated blood-pressure reading, without diagnosis of hypertension: Secondary | ICD-10-CM | POA: Diagnosis not present

## 2019-03-21 DIAGNOSIS — R1084 Generalized abdominal pain: Secondary | ICD-10-CM | POA: Diagnosis not present

## 2019-03-21 DIAGNOSIS — R112 Nausea with vomiting, unspecified: Secondary | ICD-10-CM | POA: Diagnosis not present

## 2019-03-22 DIAGNOSIS — R109 Unspecified abdominal pain: Secondary | ICD-10-CM | POA: Diagnosis not present

## 2019-03-23 ENCOUNTER — Telehealth: Payer: Self-pay

## 2019-03-23 NOTE — Telephone Encounter (Signed)
Amy Mccann called to make [place] aware of her Urgent Care visit this past week and then a follow up to the ED for a CT scan of her abdomen.  She has been nauseated with some diarrhea.  She said her CT scan was normal and she was instructed to follow-up with her Primary Care.  Dr. Tobie Poet advised that she call Dr. Crisoforo [place] office to schedule an appointment since they have seen her in the past.  She also is scheduled to see [place] later in the month.

## 2019-03-24 ENCOUNTER — Other Ambulatory Visit: Payer: Self-pay

## 2019-03-24 MED ORDER — ONDANSETRON HCL 4 MG PO TABS: 4.0000 mg | ORAL_TABLET | Freq: Three times a day (TID) | ORAL | 1 refills | Status: AC | PRN

## 2019-03-30 ENCOUNTER — Other Ambulatory Visit: Payer: Self-pay | Admitting: Family Medicine

## 2019-04-05 ENCOUNTER — Ambulatory Visit (INDEPENDENT_AMBULATORY_CARE_PROVIDER_SITE_OTHER): Payer: Medicare HMO | Admitting: Family Medicine

## 2019-04-05 ENCOUNTER — Other Ambulatory Visit: Payer: Self-pay

## 2019-04-05 ENCOUNTER — Encounter: Payer: Self-pay | Admitting: Family Medicine

## 2019-04-05 VITALS — BP 106/56 | HR 80 | Temp 97.0°F | Resp 16 | Ht 61.0 in | Wt 163.0 lb

## 2019-04-05 DIAGNOSIS — K219 Gastro-esophageal reflux disease without esophagitis: Secondary | ICD-10-CM | POA: Insufficient documentation

## 2019-04-05 DIAGNOSIS — E782 Mixed hyperlipidemia: Secondary | ICD-10-CM | POA: Insufficient documentation

## 2019-04-05 DIAGNOSIS — E785 Hyperlipidemia, unspecified: Secondary | ICD-10-CM | POA: Diagnosis not present

## 2019-04-05 DIAGNOSIS — I10 Essential (primary) hypertension: Secondary | ICD-10-CM | POA: Insufficient documentation

## 2019-04-05 DIAGNOSIS — E1169 Type 2 diabetes mellitus with other specified complication: Secondary | ICD-10-CM | POA: Insufficient documentation

## 2019-04-05 DIAGNOSIS — E1159 Type 2 diabetes mellitus with other circulatory complications: Secondary | ICD-10-CM | POA: Insufficient documentation

## 2019-04-05 LAB — POCT UA - MICROALBUMIN: Microalbumin Ur, POC: 30 mg/L

## 2019-04-05 NOTE — Patient Instructions (Signed)
Continue current medications.  Continue eating healthy.  Work on exercise.

## 2019-04-05 NOTE — Progress Notes (Signed)
Subjective:  Patient ID: Amy Mccann, female    DOB: [birth year]  Age: 79 y.o. MRN: [id number]  Chief Complaint  Patient presents with  . Hyperlipidemia  . Hypertension  . Diabetes    Hyperlipidemia This is a chronic problem. The current episode started more than 1 year ago. The problem is controlled. Pertinent negatives include no chest pain, myalgias or shortness of breath. Current antihyperlipidemic treatment includes statins. There are no compliance problems.   Hypertension This is a chronic problem. The current episode started more than 1 year ago. The problem is controlled. Pertinent negatives include no chest pain, headaches or shortness of breath. Past treatments include angiotensin blockers (losartan). There are no compliance problems.    Patient has had diabetes since 2006. Checking sugars once daily. Range 120-140. Eating healthy. Exercise is limited due to weather and covid 19.    Social History   Socioeconomic History  . Marital status: Divorced    Spouse name: Not on file  . Number of children: Not on file  . Years of education: Not on file  . Highest education level: Not on file  Occupational History  . Not on file  Tobacco Use  . Smoking status: Never Smoker  . Smokeless tobacco: Never Used  Substance and Sexual Activity  . Alcohol use: Never  . Drug use: Never  . Sexual activity: Not on file  Other Topics Concern  . Not on file  Social History Narrative  . Not on file   Social Determinants of Health   Financial Resource Strain:   . Difficulty of Paying Living Expenses: Not on file  Food Insecurity:   . Worried About Charity fundraiser in the Last Year: Not on file  . Ran Out of Food in the Last Year: Not on file  Transportation Needs:   . Lack of Transportation (Medical): Not on file  . Lack of Transportation (Non-Medical): Not on file  Physical Activity:   . Days of Exercise per Week: Not on file  . Minutes of Exercise per Session: Not on  file  Stress:   . Feeling of Stress : Not on file  Social Connections:   . Frequency of Communication with Friends and Family: Not on file  . Frequency of Social Gatherings with Friends and Family: Not on file  . Attends Religious Services: Not on file  . Active Member of Clubs or Organizations: Not on file  . Attends Archivist Meetings: Not on file  . Marital Status: Not on file   Past Medical History:  Diagnosis Date  . Generalized anxiety disorder   . GERD (gastroesophageal reflux disease)   . Hypertension    No family history on file.  Review of Systems  Constitutional: Negative for chills, fatigue and fever.  HENT: Negative for congestion, ear pain and sore throat.   Respiratory: Negative for cough and shortness of breath.   Cardiovascular: Negative for chest pain.  Gastrointestinal: Positive for abdominal pain and nausea. Negative for constipation, diarrhea and vomiting.       Improving. Bloating...feels like abdomen is swelling  Constipation - BM every other day. Hard. Taking prunes. Miralax every other day.  Nausea - ginger tea seems to help. She has an appointment with Dr. Lyda Jester in Early March, she is debating keeping appointment.  Endocrine: Positive for polydipsia. Negative for polyphagia and polyuria.  Genitourinary: Negative for dysuria and urgency.  Musculoskeletal: Positive for arthralgias. Negative for myalgias.  Hands. Takes tylenol which helps.   Neurological: Negative for dizziness, numbness and headaches.  Psychiatric/Behavioral: Negative for dysphoric mood. The patient is not nervous/anxious.      Objective:  BP (!) 106/56   Pulse 80   Temp (!) 97 F (36.1 C)   Resp 16   Ht 5\' 1"  (1.549 m)   Wt 163 lb (73.9 kg)   BMI 30.80 kg/m   BP/Weight [id number]  Systolic BP [id number]  Diastolic BP 56  Wt. (Lbs) 163  BMI 30.8    Physical Exam Vitals reviewed.  Constitutional:      Appearance: Normal appearance. She is obese.    Cardiovascular:     Rate and Rhythm: Normal rate.     Pulses: Normal pulses.     Heart sounds: Normal heart sounds. No murmur.  Pulmonary:     Effort: Pulmonary effort is normal.     Breath sounds: Normal breath sounds.  Abdominal:     General: Bowel sounds are normal. There is no distension.     Palpations: Abdomen is soft.     Tenderness: There is no abdominal tenderness.  Neurological:     Mental Status: She is alert.  Psychiatric:        Mood and Affect: Mood normal.        Behavior: Behavior normal.     Lab Results  Component Value Date   WBC 11.2 (H) 01/30/2010   HGB 9.1 (L) 01/30/2010   HCT 26.6 (L) 01/30/2010   PLT 256 01/30/2010   GLUCOSE 190 (H) 01/30/2010   ALT 20 01/21/2010   AST 27 01/21/2010   NA 135 01/30/2010   K 3.6 01/30/2010   CL 104 01/30/2010   CREATININE 1.06 01/30/2010   BUN 8 01/30/2010   CO2 24 01/30/2010   INR 0.99 01/21/2010   HGBA1C (H) 01/28/2010    6.1 (NOTE)                                                                       According to the ADA Clinical Practice Recommendations for 2011, when HbA1c is used as a screening test:   >=6.5%   Diagnostic of Diabetes Mellitus           (if abnormal result  is confirmed)  5.7-6.4%   Increased risk of developing Diabetes Mellitus  References:Diagnosis and Classification of Diabetes Mellitus,Diabetes [id number] 1):S62-S69 and Standards of Medical Care in         Diabetes - 2011,Diabetes Care,2011,34  (Suppl 1):S11-S61.      Assessment & Plan:  Essential hypertension, benign Well controlled.  No changes to medicines.  Continue to work on eating a healthy diet and exercise.  Labs drawn today.   GERD without esophagitis Well controlled.  No changes to medicines.  Continue to work on eating a healthy diet and exercise.  Labs drawn today.   Hyperlipidemia associated with type 2 diabetes mellitus ([place]) Well controlled.  No changes to medicines.  Continue to work on eating a  healthy diet and exercise.  Labs drawn today.   Mixed hyperlipidemia Well controlled.  No changes to medicines.  Continue to work on eating a healthy diet and exercise.  Labs drawn today.   Follow-up: No follow-ups on  file.  AVS was given to patient prior to departure.  Rochel Brome [facility] [phone]

## 2019-04-06 LAB — COMP. METABOLIC PANEL (12)
AST: 23 IU/L (ref 0–40)
Albumin/Globulin Ratio: 1.7 (ref 1.2–2.2)
Albumin: 4.5 g/dL (ref 3.7–4.7)
Alkaline Phosphatase: 110 IU/L (ref 39–117)
BUN/Creatinine Ratio: 15 (ref 12–28)
BUN: 15 mg/dL (ref 8–27)
Bilirubin Total: 0.4 mg/dL (ref 0.0–1.2)
Calcium: 9.8 mg/dL (ref 8.7–10.3)
Chloride: 99 mmol/L (ref 96–106)
Creatinine, Ser: 0.98 mg/dL (ref 0.57–1.00)
GFR calc Af Amer: 64 mL/min/{1.73_m2} (ref >59)
GFR calc non Af Amer: 55 mL/min/{1.73_m2} — ABNORMAL LOW (ref >59)
Globulin, Total: 2.6 g/dL (ref 1.5–4.5)
Glucose: 116 mg/dL — ABNORMAL HIGH (ref 65–99)
Potassium: 4.6 mmol/L (ref 3.5–5.2)
Sodium: 136 mmol/L (ref 134–144)
Total Protein: 7.1 g/dL (ref 6.0–8.5)

## 2019-04-06 LAB — CBC WITH DIFFERENTIAL/PLATELET
Basophils Absolute: 0.1 10*3/uL (ref 0.0–0.2)
Basos: 1 %
EOS (ABSOLUTE): 0.1 10*3/uL (ref 0.0–0.4)
Eos: 2 %
Hematocrit: 40.8 % (ref 34.0–46.6)
Hemoglobin: 13.8 g/dL (ref 11.1–15.9)
Immature Grans (Abs): 0 10*3/uL (ref 0.0–0.1)
Immature Granulocytes: 0 %
Lymphocytes Absolute: 1.6 10*3/uL (ref 0.7–3.1)
Lymphs: 20 %
MCH: 32.2 pg (ref 26.6–33.0)
MCHC: 33.8 g/dL (ref 31.5–35.7)
MCV: 95 fL (ref 79–97)
Monocytes Absolute: 0.7 10*3/uL (ref 0.1–0.9)
Monocytes: 9 %
Neutrophils Absolute: 5.5 10*3/uL (ref 1.4–7.0)
Neutrophils: 68 %
Platelets: 290 10*3/uL (ref 150–450)
RBC: 4.28 x10E6/uL (ref 3.77–5.28)
RDW: 12.3 % (ref 11.7–15.4)
WBC: 8.1 10*3/uL (ref 3.4–10.8)

## 2019-04-06 LAB — LIPID PANEL
Chol/HDL Ratio: 2.9 ratio (ref 0.0–4.4)
Cholesterol, Total: 184 mg/dL (ref 100–199)
HDL: 63 mg/dL (ref >39)
LDL Chol Calc (NIH): 91 mg/dL (ref 0–99)
Triglycerides: 177 mg/dL — ABNORMAL HIGH (ref 0–149)
VLDL Cholesterol Cal: 30 mg/dL (ref 5–40)

## 2019-04-06 LAB — HEMOGLOBIN A1C
Est. average glucose Bld gHb Est-mCnc: 128 mg/dL
Hgb A1c MFr Bld: 6.1 % — ABNORMAL HIGH (ref 4.8–5.6)

## 2019-04-06 LAB — CARDIOVASCULAR RISK ASSESSMENT

## 2019-04-08 ENCOUNTER — Other Ambulatory Visit: Payer: Self-pay | Admitting: Family Medicine

## 2019-04-11 ENCOUNTER — Other Ambulatory Visit: Payer: Self-pay | Admitting: Family Medicine

## 2019-04-15 ENCOUNTER — Encounter: Payer: Self-pay | Admitting: Family Medicine

## 2019-04-15 NOTE — Assessment & Plan Note (Signed)
Well controlled.  ?No changes to medicines.  ?Continue to work on eating a healthy diet and exercise.  ?Labs drawn today.  ?

## 2019-04-19 DIAGNOSIS — Z853 Personal history of malignant neoplasm of breast: Secondary | ICD-10-CM | POA: Diagnosis not present

## 2019-04-25 DIAGNOSIS — M47816 Spondylosis without myelopathy or radiculopathy, lumbar region: Secondary | ICD-10-CM | POA: Diagnosis not present

## 2019-05-03 ENCOUNTER — Other Ambulatory Visit: Payer: Self-pay | Admitting: Family Medicine

## 2019-05-10 DIAGNOSIS — E890 Postprocedural hypothyroidism: Secondary | ICD-10-CM | POA: Diagnosis not present

## 2019-05-10 DIAGNOSIS — E871 Hypo-osmolality and hyponatremia: Secondary | ICD-10-CM | POA: Diagnosis not present

## 2019-05-10 DIAGNOSIS — C73 Malignant neoplasm of thyroid gland: Secondary | ICD-10-CM | POA: Diagnosis not present

## 2019-05-17 DIAGNOSIS — Z6832 Body mass index (BMI) 32.0-32.9, adult: Secondary | ICD-10-CM | POA: Diagnosis not present

## 2019-05-17 DIAGNOSIS — M47816 Spondylosis without myelopathy or radiculopathy, lumbar region: Secondary | ICD-10-CM | POA: Diagnosis not present

## 2019-05-17 DIAGNOSIS — I10 Essential (primary) hypertension: Secondary | ICD-10-CM | POA: Diagnosis not present

## 2019-05-24 ENCOUNTER — Other Ambulatory Visit: Payer: Self-pay | Admitting: Family Medicine

## 2019-05-30 ENCOUNTER — Other Ambulatory Visit: Payer: Self-pay | Admitting: Family Medicine

## 2019-06-01 ENCOUNTER — Telehealth: Payer: Self-pay | Admitting: Family Medicine

## 2019-06-01 NOTE — Progress Notes (Signed)
  Chronic Care Management   Outreach Note  06/01/2019 Name: Amy Mccann MRN: [id number] DOB: [birth year]  Referred by: Rochel Brome, MD Reason for referral : No chief complaint on file.   An unsuccessful telephone outreach was attempted today. The patient was referred to the pharmacist for assistance with care management and care coordination.   Follow Up Plan:   Earney [place] Upstream Scheduler

## 2019-06-08 ENCOUNTER — Telehealth: Payer: Self-pay | Admitting: Family Medicine

## 2019-06-08 NOTE — Progress Notes (Signed)
  Chronic Care Management   Outreach Note  06/08/2019 Name: Amy Mccann MRN: [id number] DOB: [birth year]  Referred by: Rochel Brome, MD Reason for referral : No chief complaint on file.   An unsuccessful telephone outreach was attempted today. The patient was referred to the pharmacist for assistance with care management and care coordination.   This note is not being shared with the patient for the following reason: To respect privacy (The patient or proxy has requested that the information not be shared).  Follow Up Plan:   Earney [place] Upstream Scheduler

## 2019-06-14 ENCOUNTER — Other Ambulatory Visit: Payer: Self-pay | Admitting: Family Medicine

## 2019-06-14 ENCOUNTER — Other Ambulatory Visit: Payer: Self-pay

## 2019-06-14 MED ORDER — LEVOTHYROXINE SODIUM 112 MCG PO TABS: 112.0000 ug | ORAL_TABLET | Freq: Every day | ORAL | 0 refills | Status: DC

## 2019-06-26 ENCOUNTER — Telehealth: Payer: Self-pay

## 2019-06-26 NOTE — Telephone Encounter (Signed)
Amy Mccann called about her vitamins.  She is taking vitamin C, E, D3, eye vitamins, calcium and probiotics.  She has been having some GI upset and thinks it is related to the vitamins.  She is going to stop the vitamin C & E, and the probiotics.  She will begin a multivitamin and continue her D3 and calcium along with her eye vitamins.  She has a upcoming appointment later this month.

## 2019-06-30 NOTE — Progress Notes (Signed)
Subjective:  Patient ID: Amy Mccann, female    DOB: September 26, 1940  Age: 79 y.o. MRN: [id number]  Chief Complaint  Patient presents with  . Hyperlipidemia  . Diabetes    HPI  Pt presents for follow up of hypertension.  Her current cardiac medication regimen includes aspirin and losartan.  She is tolerating the medication well without side effects.  Compliance with treatment has been good; she takes her medication as directed and follows up as directed.      Pt presents with hyperlipidemia.  Current treatment includes Crestor.  Compliance with treatment has been good; she takes her medication as directed, maintains her low cholesterol diet, follows up as directed, and maintains her exercise regimen.  She denies experiencing any hypercholesterolemia related symptoms.      Concerning type 2 diabetes mellitus with diabetic polyneuropathy, specifically, this is type 2, non-insulin requiring diabetes, complicated by peripheral neuropathy.  Compliance with treatment has been good; she maintains her diet and exercise regimen, follows up as directed, and is keeping a glucose diary.  Date of diagnosis 2006.  Typical diet includes low carbohydrate.  She follows a prescribed diet.  Current meds include none.  She reports home blood glucose readings have averaged fasting readings in the 120-140 mg/dL range. She checks her glucose 1 times daily.  In regard to preventative care, she performs foot self-exams daily and is overdue to see her ophthalmology exam.      Dx with low back pain; her symptoms are unchanged since last visit.  Lumbar MRI was abnormal. She has seen Dr. Maryjean Ka. Patient saw Dr. Ronnald Ramp first. She has had some ESI shots. They helped. Now scheduled to return in June for another shot.  Pt had episode of acute gastroenteritis about one week ago. Diarrhea was primary symptoms, but resolved. No nausea, no vomiting. No abdominal pain.    Past Medical History:  Diagnosis Date  . Age-related  osteoporosis without current pathological fracture 07/03/2019  . Cervicalgia 07/03/2019  . Diabetes mellitus without complication ([place])   . Generalized anxiety disorder   . GERD (gastroesophageal reflux disease)   . Hypertension   . Low back pain with sciatica 07/03/2019  . Obesity due to excess calories 07/03/2019   Past Surgical History:  Procedure Laterality Date  . [place] STUDY N/A 08/07/2013   Procedure: [place] STUDY;  Surgeon: Missy Sabins, MD;  Location: [facility];  Service: Endoscopy;  Laterality: N/A;  . ESOPHAGEAL MANOMETRY N/A 08/07/2013   Procedure: ESOPHAGEAL MANOMETRY (EM);  Surgeon: Missy Sabins, MD;  Location: [facility];  Service: Endoscopy;  Laterality: N/A;    History reviewed. No pertinent family history. Social History   Socioeconomic History  . Marital status: Divorced    Spouse name: Not on file  . Number of children: Not on file  . Years of education: Not on file  . Highest education level: Not on file  Occupational History  . Not on file  Tobacco Use  . Smoking status: Never Smoker  . Smokeless tobacco: Never Used  Substance and Sexual Activity  . Alcohol use: Never  . Drug use: Never  . Sexual activity: Not on file  Other Topics Concern  . Not on file  Social History Narrative  . Not on file   Social Determinants of Health   Financial Resource Strain:   . Difficulty of Paying Living Expenses:   Food Insecurity:   . Worried About Charity fundraiser in the Last  Year:   . Ran Out of Food in the Last Year:   Transportation Needs:   . Film/video editor (Medical):   Marland Kitchen Lack of Transportation (Non-Medical):   Physical Activity:   . Days of Exercise per Week:   . Minutes of Exercise per Session:   Stress:   . Feeling of Stress :   Social Connections:   . Frequency of Communication with Friends and Family:   . Frequency of Social Gatherings with Friends and Family:   . Attends Religious Services:   . Active Member of Clubs or  Organizations:   . Attends Archivist Meetings:   Marland Kitchen Marital Status:     Review of Systems  Constitutional: Negative for chills, fatigue and fever.  HENT: Negative for congestion, ear pain, rhinorrhea and sore throat.   Respiratory: Negative for cough and shortness of breath.   Cardiovascular: Negative for chest pain.  Gastrointestinal: Positive for diarrhea (Improved. Started Sunday through yesterday.). Negative for abdominal pain, constipation, nausea and vomiting.  Genitourinary: Negative for dysuria and urgency.  Musculoskeletal: Negative for back pain (BL knees) and myalgias.  Neurological: Negative for dizziness, weakness, light-headedness and headaches.  Psychiatric/Behavioral: Negative for dysphoric mood. The patient is not nervous/anxious.      Objective:  BP 110/70   Pulse 83   Temp (!) 97.2 F (36.2 C)   Ht 5' (1.524 m)   Wt 164 lb (74.4 kg)   SpO2 99%   BMI 32.03 kg/m   BP/Weight 07/04/2019 [id number]  Systolic BP [id number]  Diastolic BP 70 56  Wt. (Lbs) 164 163  BMI 32.03 30.8    Physical Exam Vitals reviewed.  Constitutional:      Appearance: Normal appearance. She is normal weight.  Cardiovascular:     Rate and Rhythm: Normal rate and regular rhythm.     Pulses: Normal pulses.     Heart sounds: Normal heart sounds.  Pulmonary:     Effort: Pulmonary effort is normal. No respiratory distress.     Breath sounds: Normal breath sounds.  Abdominal:     General: Abdomen is flat. Bowel sounds are normal.     Palpations: Abdomen is soft.     Tenderness: There is no abdominal tenderness.  Neurological:     Mental Status: She is alert and oriented to person, place, and time.  Psychiatric:        Mood and Affect: Mood normal.        Behavior: Behavior normal.     Diabetic Foot Exam - Simple   No data filed       Lab Results  Component Value Date   WBC 8.1 04/05/2019   HGB 13.8 04/05/2019   HCT 40.8 04/05/2019   PLT 290 04/05/2019   GLUCOSE  116 (H) 04/05/2019   CHOL 184 04/05/2019   TRIG 177 (H) 04/05/2019   HDL 63 04/05/2019   LDLCALC 91 04/05/2019   ALT 20 01/21/2010   AST 23 04/05/2019   NA 136 04/05/2019   K 4.6 04/05/2019   CL 99 04/05/2019   CREATININE 0.98 04/05/2019   BUN 15 04/05/2019   CO2 24 01/30/2010   INR 0.99 01/21/2010   HGBA1C 6.1 (H) 04/05/2019   MICROALBUR 30 04/05/2019      Assessment & Plan:   1. Essential hypertension, benign Well controlled.  No changes to medicines.  Continue to work on eating a healthy diet and exercise.  Labs drawn today.  - Comprehensive metabolic panel  2.  Hyperlipidemia associated with type 2 diabetes mellitus ([place]) AN INDIVIDUALIZED CARE PLAN: was established or reinforced today.  The patient's disease status was assessed using clinical findings on exam, labs, and/or other diagnostic testing, such as xrays, to determine his/her success in meeting treatment goals based on disease specific evidence-based guidelines and found to be well controlled.   LAB ORDERS: hgbA1C, Comprehensive metabolic profile.   MEDICATIONS: (see today's med list) A CLINICAL SUMMARY including a written plan a patient's and the patient's families understanding of their medical issues and care needs and identify barriers to care unique to individual due to social or financial issues and help create solutions together.   RECOMMENDATIONS: a daily aspirin, adherance to a diabetic diet, a graduated exercise program, HgbA1C level checked quarterly, urine microalbumin test yearly, LDL cholesterol test yearly, unless uncontrolled lipids and then every 3 months until controlled., annual monofilament test for evaluating sensation in feet, daily foot self-inspection, lower blood pressure, yearly dental exams, need for yearly flu shots, and smoking cessation ( if a smoker is recommended and assistance is offered. ).  Self Management goals as discussed in collaboration with the patient includes eating a diabetic  diet, as well as getting regular physical activity.   FOLLOW-UP: Schedule a follow-up visit in 3 months.  - Hemoglobin A1c - CBC with Differential/Platelet  3. Postoperative hypothyroidism Well controlled.  No changes to medicines.  Continue to work on eating a healthy diet and exercise.  Labs drawn today.  -TSH  4. Mixed hyperlipidemia Well controlled.  No changes to medicines.  Continue to work on eating a healthy diet and exercise.  Labs drawn today.  - Lipid panel  5. GERD without esophagitis The current medical regimen is effective;  continue present plan and medications.  6. Age-related osteoporosis without current pathological fracture No changes to medicines.  Continue to work on eating a healthy diet and exercise.    7. Other obesity due to excess calories BMI 32. Recommend continue to work on eating healthy diet and exercise.  8. Chronic low back pain with sciatica, sciatica laterality unspecified, unspecified back pain laterality  Keep appt with Dr. Maryjean Ka       Follow-up: Return in about 3 months (around 10/04/2019).  An After Visit Summary was printed and given to the patient.  Rochel Brome [facility] [phone]

## 2019-07-03 ENCOUNTER — Encounter: Payer: Self-pay | Admitting: Family Medicine

## 2019-07-03 DIAGNOSIS — F411 Generalized anxiety disorder: Secondary | ICD-10-CM | POA: Insufficient documentation

## 2019-07-03 DIAGNOSIS — M810 Age-related osteoporosis without current pathological fracture: Secondary | ICD-10-CM

## 2019-07-03 DIAGNOSIS — E6609 Other obesity due to excess calories: Secondary | ICD-10-CM | POA: Insufficient documentation

## 2019-07-03 DIAGNOSIS — M542 Cervicalgia: Secondary | ICD-10-CM | POA: Insufficient documentation

## 2019-07-03 DIAGNOSIS — M8580 Other specified disorders of bone density and structure, unspecified site: Secondary | ICD-10-CM | POA: Insufficient documentation

## 2019-07-03 DIAGNOSIS — M5440 Lumbago with sciatica, unspecified side: Secondary | ICD-10-CM

## 2019-07-03 HISTORY — DX: Cervicalgia: M54.2

## 2019-07-03 HISTORY — DX: Age-related osteoporosis without current pathological fracture: M81.0

## 2019-07-03 HISTORY — DX: Lumbago with sciatica, unspecified side: M54.40

## 2019-07-03 HISTORY — DX: Other obesity due to excess calories: E66.09

## 2019-07-04 ENCOUNTER — Encounter: Payer: Self-pay | Admitting: Family Medicine

## 2019-07-04 ENCOUNTER — Other Ambulatory Visit: Payer: Self-pay

## 2019-07-04 ENCOUNTER — Ambulatory Visit (INDEPENDENT_AMBULATORY_CARE_PROVIDER_SITE_OTHER): Payer: Medicare HMO | Admitting: Family Medicine

## 2019-07-04 VITALS — BP 110/70 | HR 83 | Temp 97.2°F | Ht 60.0 in | Wt 164.0 lb

## 2019-07-04 DIAGNOSIS — M5440 Lumbago with sciatica, unspecified side: Secondary | ICD-10-CM | POA: Diagnosis not present

## 2019-07-04 DIAGNOSIS — Z6832 Body mass index (BMI) 32.0-32.9, adult: Secondary | ICD-10-CM

## 2019-07-04 DIAGNOSIS — M810 Age-related osteoporosis without current pathological fracture: Secondary | ICD-10-CM

## 2019-07-04 DIAGNOSIS — M21619 Bunion of unspecified foot: Secondary | ICD-10-CM

## 2019-07-04 DIAGNOSIS — I10 Essential (primary) hypertension: Secondary | ICD-10-CM | POA: Diagnosis not present

## 2019-07-04 DIAGNOSIS — K219 Gastro-esophageal reflux disease without esophagitis: Secondary | ICD-10-CM | POA: Diagnosis not present

## 2019-07-04 DIAGNOSIS — E1169 Type 2 diabetes mellitus with other specified complication: Secondary | ICD-10-CM | POA: Diagnosis not present

## 2019-07-04 DIAGNOSIS — E6609 Other obesity due to excess calories: Secondary | ICD-10-CM

## 2019-07-04 DIAGNOSIS — E782 Mixed hyperlipidemia: Secondary | ICD-10-CM | POA: Diagnosis not present

## 2019-07-04 DIAGNOSIS — G8929 Other chronic pain: Secondary | ICD-10-CM

## 2019-07-04 DIAGNOSIS — M2010 Hallux valgus (acquired), unspecified foot: Secondary | ICD-10-CM | POA: Diagnosis not present

## 2019-07-04 DIAGNOSIS — E890 Postprocedural hypothyroidism: Secondary | ICD-10-CM

## 2019-07-04 DIAGNOSIS — E785 Hyperlipidemia, unspecified: Secondary | ICD-10-CM

## 2019-07-04 MED ORDER — ROSUVASTATIN CALCIUM 5 MG PO TABS: 5.0000 mg | ORAL_TABLET | Freq: Every day | ORAL | 1 refills | Status: DC

## 2019-07-05 LAB — LIPID PANEL
Chol/HDL Ratio: 2.7 ratio (ref 0.0–4.4)
Cholesterol, Total: 172 mg/dL (ref 100–199)
HDL: 63 mg/dL (ref >39)
LDL Chol Calc (NIH): 82 mg/dL (ref 0–99)
Triglycerides: 161 mg/dL — ABNORMAL HIGH (ref 0–149)
VLDL Cholesterol Cal: 27 mg/dL (ref 5–40)

## 2019-07-05 LAB — CBC WITH DIFFERENTIAL/PLATELET
Basophils Absolute: 0.1 10*3/uL (ref 0.0–0.2)
Basos: 1 %
EOS (ABSOLUTE): 0.1 10*3/uL (ref 0.0–0.4)
Eos: 1 %
Hematocrit: 42.1 % (ref 34.0–46.6)
Hemoglobin: 14.6 g/dL (ref 11.1–15.9)
Immature Grans (Abs): 0 10*3/uL (ref 0.0–0.1)
Immature Granulocytes: 1 %
Lymphocytes Absolute: 1.6 10*3/uL (ref 0.7–3.1)
Lymphs: 19 %
MCH: 33.2 pg — ABNORMAL HIGH (ref 26.6–33.0)
MCHC: 34.7 g/dL (ref 31.5–35.7)
MCV: 96 fL (ref 79–97)
Monocytes Absolute: 0.7 10*3/uL (ref 0.1–0.9)
Monocytes: 9 %
Neutrophils Absolute: 6 10*3/uL (ref 1.4–7.0)
Neutrophils: 69 %
Platelets: 266 10*3/uL (ref 150–450)
RBC: 4.4 x10E6/uL (ref 3.77–5.28)
RDW: 13 % (ref 11.7–15.4)
WBC: 8.5 10*3/uL (ref 3.4–10.8)

## 2019-07-05 LAB — COMPREHENSIVE METABOLIC PANEL
ALT: 19 IU/L (ref 0–32)
AST: 18 IU/L (ref 0–40)
Albumin/Globulin Ratio: 1.6 (ref 1.2–2.2)
Albumin: 4.5 g/dL (ref 3.7–4.7)
Alkaline Phosphatase: 102 IU/L (ref 48–121)
BUN/Creatinine Ratio: 11 — ABNORMAL LOW (ref 12–28)
BUN: 10 mg/dL (ref 8–27)
Bilirubin Total: 0.6 mg/dL (ref 0.0–1.2)
CO2: 21 mmol/L (ref 20–29)
Calcium: 10.3 mg/dL (ref 8.7–10.3)
Chloride: 96 mmol/L (ref 96–106)
Creatinine, Ser: 0.9 mg/dL (ref 0.57–1.00)
GFR calc Af Amer: 71 mL/min/{1.73_m2} (ref >59)
GFR calc non Af Amer: 61 mL/min/{1.73_m2} (ref >59)
Globulin, Total: 2.8 g/dL (ref 1.5–4.5)
Glucose: 110 mg/dL — ABNORMAL HIGH (ref 65–99)
Potassium: 4.1 mmol/L (ref 3.5–5.2)
Sodium: 137 mmol/L (ref 134–144)
Total Protein: 7.3 g/dL (ref 6.0–8.5)

## 2019-07-05 LAB — HEMOGLOBIN A1C
Est. average glucose Bld gHb Est-mCnc: 131 mg/dL
Hgb A1c MFr Bld: 6.2 % — ABNORMAL HIGH (ref 4.8–5.6)

## 2019-07-05 LAB — CARDIOVASCULAR RISK ASSESSMENT

## 2019-07-17 ENCOUNTER — Other Ambulatory Visit: Payer: Self-pay | Admitting: Family Medicine

## 2019-07-19 ENCOUNTER — Telehealth: Payer: Self-pay | Admitting: Family Medicine

## 2019-07-19 ENCOUNTER — Other Ambulatory Visit: Payer: Self-pay

## 2019-07-19 MED ORDER — LEVOTHYROXINE SODIUM 112 MCG PO TABS: 112.0000 ug | ORAL_TABLET | Freq: Every day | ORAL | 1 refills | Status: DC

## 2019-07-19 NOTE — Progress Notes (Signed)
  Chronic Care Management   Outreach Note  07/19/2019 Name: TAMRE CASS MRN: [id number] DOB: October 15, 1940  Referred by: Rochel Brome, MD Reason for referral : No chief complaint on file.   An unsuccessful telephone outreach was attempted today. The patient was referred to the pharmacist for assistance with care management and care coordination.   This note is not being shared with the patient for the following reason: To respect privacy (The patient or proxy has requested that the information not be shared).  Follow Up Plan:   Earney [place] Upstream Scheduler

## 2019-07-26 ENCOUNTER — Other Ambulatory Visit: Payer: Self-pay

## 2019-07-26 ENCOUNTER — Ambulatory Visit (INDEPENDENT_AMBULATORY_CARE_PROVIDER_SITE_OTHER): Payer: Medicare HMO

## 2019-07-26 VITALS — BP 132/78 | HR 70 | Temp 98.0°F | Resp 16 | Ht 60.0 in | Wt 167.0 lb

## 2019-07-26 DIAGNOSIS — Z0000 Encounter for general adult medical examination without abnormal findings: Secondary | ICD-10-CM

## 2019-07-27 NOTE — Patient Instructions (Signed)
Screening for Type 2 Diabetes  A screening test for type 2 diabetes (type 2 diabetes mellitus) is a blood test to measure your blood sugar (glucose) level. This test is done to check for early signs of diabetes, before you develop symptoms.  Type 2 diabetes is a long-term (chronic) disease. In type 2 diabetes, one or both of these problems may be present:  The pancreas does not make enough of a hormone called insulin.  Cells in the body do not respond properly to insulin that the body makes (insulin resistance). Normally, insulin allows blood sugar (glucose) to enter cells in the body. The cells use glucose for energy. Insulin resistance or lack of insulin causes excess glucose to build up in the blood instead of going into cells. This results in high blood glucose levels (hyperglycemia), which can cause many complications. You may be screened for type 2 diabetes as part of your regular health care, especially if you have a high risk for diabetes. Screening can help to identify type 2 diabetes at its early stage (prediabetes). Identifying and treating prediabetes may delay or prevent the development of type 2 diabetes. What are the risk factors for type 2 diabetes? The following factors may make you more likely to develop type 2 diabetes:  Having a parent or sibling (first-degree relative) who has diabetes.  Being overweight or obese.  Being of American-Indian, African-American, Hispanic, Latino, Asian, or [employer] descent.  Not getting enough exercise (having a sedentary lifestyle).  Being older than age 64.  Having a history of diabetes during pregnancy (gestational diabetes).  Having low levels of good cholesterol (HDL-C) or high levels of blood fats (triglycerides).  Having high blood glucose in a previous blood test.  Having high blood pressure.  Having certain diseases or conditions that may be caused by insulin resistance, including: ? Acanthosis nigricans. This is  a condition that causes dark skin on the neck, armpits, and groin. ? Polycystic ovary syndrome (PCOS). ? Cardiovascular heart disease. Who should be screened for type 2 diabetes? Adults  Adults age 45 and older. These adults should be screened once every three years.  Adults who are younger than age 70, are overweight, and have one other risk factor. These adults should be screened once every three years.  Adults who have normal blood glucose levels and two or more risk factors. These adults may be screened once every year (annually).  Women who have had gestational diabetes in the past. These women should be screened once every three years.  Pregnant women who have risk factors. These women should be screened at their first prenatal visit and again between weeks 24 and 28 of pregnancy. Children and adolescents  Children and adolescents should be screened for type 2 diabetes if they are overweight and have any of the following risk factors: ? A family history of type 2 diabetes. ? Being a member of a high-risk ethnic group. ? Signs of insulin resistance or conditions that are associated with insulin resistance. ? A mother who had gestational diabetes while pregnant with him or her.  Screening should be done at least once every three years, starting at age 22 or at the onset of puberty, whichever comes first. Your health care provider or your child's health care provider may recommend having a screening more or less often. What happens during screening? During screening, your health care provider may ask questions about:  Your health and your risk factors, including your activity level and any medical  conditions that you have.  The health of your first-degree relatives.  Past pregnancies, if this applies. Your health care provider will also do a physical exam, including a blood pressure measurement and blood tests. There are four blood tests that can be used to screen for type 2  diabetes. You may have one or more of the following:  A fasting blood glucose (FBG) test. You will not be allowed to eat (you will fast) for 8 hours or more before a blood sample is taken.  A random blood glucose test. This test checks your blood glucose at any time of the day regardless of when you ate.  An oral glucose tolerance test (OGTT). This test measures your blood glucose at two times: ? After you have not eaten (have fasted) overnight. This is your baseline glucose level. ? Two hours after you drink a glucose-containing beverage.  An A1c (hemoglobin A1c) blood test. This test provides information about blood glucose control over the previous 2-3 months. What do the results mean? Your test results are a measurement of how much glucose is in your blood. Normal blood glucose levels mean that you do not have diabetes or prediabetes. High blood glucose levels may mean that you have prediabetes or diabetes. Depending on the results, other tests may be needed to confirm the diagnosis. You may be diagnosed with type 2 diabetes if:  Your FBG level is 126 mg/dL (7.0 mmol/L) or higher.  Your random blood glucose level is 200 mg/dL (11.1 mmol/L) or higher.  Your A1c level is 6.5% or higher.  Your OGTT result is higher than 200 mg/dL (11.1 mmol/L). These blood tests may be repeated to confirm your diagnosis. Talk with your health care provider about what your results mean. Summary  A screening test for type 2 diabetes (type 2 diabetes mellitus) is a blood test to measure your blood sugar (glucose) level.  Know what your risk factors are for developing type 2 diabetes.  If you are at risk, get screening tests as often as told by your health care provider.  Screening may help you identify type 2 diabetes at its early stage (prediabetes). Identifying and treating prediabetes may delay or prevent the development of type 2 diabetes. This information is not intended to replace advice given to  you by your health care provider. Make sure you discuss any questions you have with your health care provider. Document Revised: 05/20/2018 Document Reviewed: 02/29/2016 Elsevier Patient Education  [place] Prevention in the Home, Adult Falls can cause injuries. They can happen to people of all ages. There are many things you can do to make your home safe and to help prevent falls. Ask for help when making these changes, if needed. What actions can I take to prevent falls? General Instructions  Use good lighting in all rooms. Replace any light bulbs that burn out.  Turn on the lights when you go into a dark area. Use night-lights.  Keep items that you use often in easy-to-reach places. Lower the shelves around your home if necessary.  Set up your furniture so you have a clear path. Avoid moving your furniture around.  Do not have throw rugs and other things on the floor that can make you trip.  Avoid walking on wet floors.  If any of your floors are uneven, fix them.  Add color or contrast paint or tape to clearly mark and help you see: ? Any grab bars or handrails. ? First  and last steps of stairways. ? Where the edge of each step is.  If you use a stepladder: ? Make sure that it is fully opened. Do not climb a closed stepladder. ? Make sure that both sides of the stepladder are locked into place. ? Ask someone to hold the stepladder for you while you use it.  If there are any pets around you, be aware of where they are. What can I do in the bathroom?      Keep the floor dry. Clean up any water that spills onto the floor as soon as it happens.  Remove soap buildup in the tub or shower regularly.  Use non-skid mats or decals on the floor of the tub or shower.  Attach bath mats securely with double-sided, non-slip rug tape.  If you need to sit down in the shower, use a plastic, non-slip stool.  Install grab bars by the toilet and in the tub and  shower. Do not use towel bars as grab bars. What can I do in the bedroom?  Make sure that you have a light by your bed that is easy to reach.  Do not use any sheets or blankets that are too big for your bed. They should not hang down onto the floor.  Have a firm chair that has side arms. You can use this for support while you get dressed. What can I do in the kitchen?  Clean up any spills right away.  If you need to reach something above you, use a strong step stool that has a grab bar.  Keep electrical cords out of the way.  Do not use floor polish or wax that makes floors slippery. If you must use wax, use non-skid floor wax. What can I do with my stairs?  Do not leave any items on the stairs.  Make sure that you have a light switch at the top of the stairs and the bottom of the stairs. If you do not have them, ask someone to add them for you.  Make sure that there are handrails on both sides of the stairs, and use them. Fix handrails that are broken or loose. Make sure that handrails are as long as the stairways.  Install non-slip stair treads on all stairs in your home.  Avoid having throw rugs at the top or bottom of the stairs. If you do have throw rugs, attach them to the floor with carpet tape.  Choose a carpet that does not hide the edge of the steps on the stairway.  Check any carpeting to make sure that it is firmly attached to the stairs. Fix any carpet that is loose or worn. What can I do on the outside of my home?  Use bright outdoor lighting.  Regularly fix the edges of walkways and driveways and fix any cracks.  Remove anything that might make you trip as you walk through a door, such as a raised step or threshold.  Trim any bushes or trees on the path to your home.  Regularly check to see if handrails are loose or broken. Make sure that both sides of any steps have handrails.  Install guardrails along the edges of any raised decks and porches.  Clear  walking paths of anything that might make someone trip, such as tools or rocks.  Have any leaves, snow, or ice cleared regularly.  Use sand or salt on walking paths during winter.  Clean up any spills in your garage right  away. This includes grease or oil spills. What other actions can I take?  Wear shoes that: ? Have a low heel. Do not wear high heels. ? Have rubber bottoms. ? Are comfortable and fit you well. ? Are closed at the toe. Do not wear open-toe sandals.  Use tools that help you move around (mobility aids) if they are needed. These include: ? Canes. ? Walkers. ? Scooters. ? Crutches.  Review your medicines with your doctor. Some medicines can make you feel dizzy. This can increase your chance of falling. Ask your doctor what other things you can do to help prevent falls. Where to find more information  Centers for Disease Control and Prevention, [facility]: https://garcia.biz/  [employer] on Aging: BrainJudge.co.uk Contact a doctor if:  You are afraid of falling at home.  You feel weak, drowsy, or dizzy at home.  You fall at home. Summary  There are many simple things that you can do to make your home safe and to help prevent falls.  Ways to make your home safe include removing tripping hazards and installing grab bars in the bathroom.  Ask for help when making these changes in your home. This information is not intended to replace advice given to you by your health care provider. Make sure you discuss any questions you have with your health care provider. Document Revised: 05/19/2018 Document Reviewed: 09/10/2016 Elsevier Patient Education  2020 [place] Maintenance, Female Adopting a healthy lifestyle and getting preventive care are important in promoting health and wellness. Ask your health care provider about:  The right schedule for you to have regular tests and exams.  Things you can do on your own to prevent diseases and  keep yourself healthy. What should I know about diet, weight, and exercise? Eat a healthy diet   Eat a diet that includes plenty of vegetables, fruits, low-fat dairy products, and lean protein.  Do not eat a lot of foods that are high in solid fats, added sugars, or sodium. Maintain a healthy weight Body mass index (BMI) is used to identify weight problems. It estimates body fat based on height and weight. Your health care provider can help determine your BMI and help you achieve or maintain a healthy weight. Get regular exercise Get regular exercise. This is one of the most important things you can do for your health. Most adults should:  Exercise for at least 150 minutes each week. The exercise should increase your heart rate and make you sweat (moderate-intensity exercise).  Do strengthening exercises at least twice a week. This is in addition to the moderate-intensity exercise.  Spend less time sitting. Even light physical activity can be beneficial. Watch cholesterol and blood lipids Have your blood tested for lipids and cholesterol at 79 years of age, then have this test every 5 years. Have your cholesterol levels checked more often if:  Your lipid or cholesterol levels are high.  You are older than 79 years of age.  You are at high risk for heart disease. What should I know about cancer screening? Depending on your health history and family history, you may need to have cancer screening at various ages. This may include screening for:  Breast cancer.  Cervical cancer.  Colorectal cancer.  Skin cancer.  Lung cancer. What should I know about heart disease, diabetes, and high blood pressure? Blood pressure and heart disease  High blood pressure causes heart disease and increases the risk of stroke. This is more likely  to develop in people who have high blood pressure readings, are of African descent, or are overweight.  Have your blood pressure checked: ? Every 3-5  years if you are 29-56 years of age. ? Every year if you are 21 years old or older. Diabetes Have regular diabetes screenings. This checks your fasting blood sugar level. Have the screening done:  Once every three years after age 53 if you are at a normal weight and have a low risk for diabetes.  More often and at a younger age if you are overweight or have a high risk for diabetes. What should I know about preventing infection? Hepatitis B If you have a higher risk for hepatitis B, you should be screened for this virus. Talk with your health care provider to find out if you are at risk for hepatitis B infection. Hepatitis C Testing is recommended for:  Everyone born from [birth year] through 1965.  Anyone with known risk factors for hepatitis C. Sexually transmitted infections (STIs)  Get screened for STIs, including gonorrhea and chlamydia, if: ? You are sexually active and are younger than 79 years of age. ? You are older than 79 years of age and your health care provider tells you that you are at risk for this type of infection. ? Your sexual activity has changed since you were last screened, and you are at increased risk for chlamydia or gonorrhea. Ask your health care provider if you are at risk.  Ask your health care provider about whether you are at high risk for HIV. Your health care provider may recommend a prescription medicine to help prevent HIV infection. If you choose to take medicine to prevent HIV, you should first get tested for HIV. You should then be tested every 3 months for as long as you are taking the medicine. Pregnancy  If you are about to stop having your period (premenopausal) and you may become pregnant, seek counseling before you get pregnant.  Take 400 to 800 micrograms (mcg) of folic acid every day if you become pregnant.  Ask for birth control (contraception) if you want to prevent pregnancy. Osteoporosis and menopause Osteoporosis is a disease in which the  bones lose minerals and strength with aging. This can result in bone fractures. If you are 56 years old or older, or if you are at risk for osteoporosis and fractures, ask your health care provider if you should:  Be screened for bone loss.  Take a calcium or vitamin D supplement to lower your risk of fractures.  Be given hormone replacement therapy (HRT) to treat symptoms of menopause. Follow these instructions at home: Lifestyle  Do not use any products that contain nicotine or tobacco, such as cigarettes, e-cigarettes, and chewing tobacco. If you need help quitting, ask your health care provider.  Do not use street drugs.  Do not share needles.  Ask your health care provider for help if you need support or information about quitting drugs. Alcohol use  Do not drink alcohol if: ? Your health care provider tells you not to drink. ? You are pregnant, may be pregnant, or are planning to become pregnant.  If you drink alcohol: ? Limit how much you use to 0-1 drink a day. ? Limit intake if you are breastfeeding.  Be aware of how much alcohol is in your drink. In the [place] one drink equals one 12 oz bottle of beer (355 mL), one 5 oz glass of wine (148 mL), or one 1 oz  glass of hard liquor (44 mL). General instructions  Schedule regular health, dental, and eye exams.  Stay current with your vaccines.  Tell your health care provider if: ? You often feel depressed. ? You have ever been abused or do not feel safe at home. Summary  Adopting a healthy lifestyle and getting preventive care are important in promoting health and wellness.  Follow your health care provider's instructions about healthy diet, exercising, and getting tested or screened for diseases.  Follow your health care provider's instructions on monitoring your cholesterol and blood pressure. This information is not intended to replace advice given to you by your health care provider. Make sure you discuss any  questions you have with your health care provider. Document Revised: 01/19/2018 Document Reviewed: 01/19/2018 Elsevier Patient Education  2020 [employer].

## 2019-07-27 NOTE — Progress Notes (Signed)
Subjective:   BIRGIT NOWLING is a 79 y.o. female who presents for Medicare Annual (Subsequent) preventive examination.  This wellness visit is conducted by a nurse.  The patient's medications were reviewed and reconciled since the patient's last visit.  History details were provided by the patient.  The history appears to be reliable.    Patient's last AWV was one year ago.   Medical History: Patient history and Family history was reviewed  Medications, Allergies, and preventative health maintenance was reviewed and updated.   Review of Systems:  Review of Systems  Constitutional: Negative.   HENT: Negative.   Eyes: Negative.   Respiratory: Negative.  Negative for cough, chest tightness and shortness of breath.   Cardiovascular: Negative.  Negative for chest pain and palpitations.  Gastrointestinal: Negative.   Genitourinary: Negative.   Musculoskeletal: Positive for arthralgias and back pain.  Neurological: Negative.  Negative for dizziness, numbness and headaches.  Psychiatric/Behavioral: Negative.  Negative for confusion, dysphoric mood and suicidal ideas.   Cardiac Risk Factors include: advanced age (>86men, >61 women);diabetes mellitus;hypertension     Objective:     Vitals: BP 132/78 (BP Location: Right Arm, Patient Position: Sitting, Cuff Size: Large)   Pulse 70   Temp 98 F (36.7 C) (Temporal)   Resp 16   Ht 5' (1.524 m)   Wt 167 lb (75.8 kg)   SpO2 98%   BMI 32.61 kg/m   Body mass index is 32.61 kg/m.  Advanced Directives 07/26/2019  Does Patient Have a Medical Advance Directive? Yes  Type of Advance Directive Living will  Does patient want to make changes to medical advance directive? No - Patient declined    Tobacco Social History   Tobacco Use  Smoking Status Never Smoker  Smokeless Tobacco Never Used     Counseling given: Not Answered   Clinical Intake:  Pre-visit preparation completed: Yes  Pain : No/denies pain Pain Score: 0-No pain Pain  Type: Chronic pain Pain Location: Back Pain Descriptors / Indicators: Aching Pain Relieving Factors: Ibuprofen Effect of Pain on Daily Activities: a little, has to rest frequently  Pain Relieving Factors: Ibuprofen  BMI - recorded: 32.61 Nutritional Status: BMI > 30  Obese Nutritional Risks: None Diabetes: Yes CBG done?: No Did pt. bring in CBG monitor from home?: No  How often do you need to have someone help you when you read instructions, pamphlets, or other written materials from your doctor or pharmacy?: 2 - Rarely  Interpreter Needed?: No     Past Medical History:  Diagnosis Date  . Age-related osteoporosis without current pathological fracture 07/03/2019  . Cancer [facility]) 1990   Left Breast Cancer   . Cancer [facility]) 2017   Thyroid Cancer  . Cervicalgia 07/03/2019  . Diabetes mellitus without complication ([place])   . Generalized anxiety disorder   . GERD (gastroesophageal reflux disease)   . Hypertension   . Low back pain with sciatica 07/03/2019  . Obesity due to excess calories 07/03/2019   Past Surgical History:  Procedure Laterality Date  . [place] STUDY N/A 08/07/2013   Procedure: [place] STUDY;  Surgeon: Missy Sabins, MD;  Location: [facility];  Service: Endoscopy;  Laterality: N/A;  . APPENDECTOMY  03/17/2016  . ESOPHAGEAL MANOMETRY N/A 08/07/2013   Procedure: ESOPHAGEAL MANOMETRY (EM);  Surgeon: Missy Sabins, MD;  Location: [facility];  Service: Endoscopy;  Laterality: N/A;  . JOINT REPLACEMENT    . MASTECTOMY Left 1990   No family  history on file. Social History   Socioeconomic History  . Marital status: Divorced    Spouse name: Not on file  . Number of children: Not on file  . Years of education: Not on file  . Highest education level: Not on file  Occupational History  . Not on file  Tobacco Use  . Smoking status: Never Smoker  . Smokeless tobacco: Never Used  Vaping Use  . Vaping Use: Never used  Substance and Sexual Activity  . Alcohol  use: Never  . Drug use: Never  . Sexual activity: Not on file  Other Topics Concern  . Not on file  Social History Narrative  . Not on file   Social Determinants of Health   Financial Resource Strain:   . Difficulty of Paying Living Expenses:   Food Insecurity:   . Worried About Charity fundraiser in the Last Year:   . Arboriculturist in the Last Year:   Transportation Needs:   . Film/video editor (Medical):   Marland Kitchen Lack of Transportation (Non-Medical):   Physical Activity:   . Days of Exercise per Week:   . Minutes of Exercise per Session:   Stress:   . Feeling of Stress :   Social Connections:   . Frequency of Communication with Friends and Family:   . Frequency of Social Gatherings with Friends and Family:   . Attends Religious Services:   . Active Member of Clubs or Organizations:   . Attends Archivist Meetings:   Marland Kitchen Marital Status:     Outpatient Encounter Medications as of 07/26/2019  Medication Sig  . ascorbic acid (VITAMIN C) 500 MG tablet Take by mouth.  . Ginger, Zingiber officinalis, (GINGER ROOT PO) Take by mouth.  Marland Kitchen ibuprofen (ADVIL) 600 MG tablet   . levothyroxine (SYNTHROID) 112 MCG tablet Take 1 tablet (112 mcg total) by mouth daily before breakfast.  . losartan (COZAAR) 25 MG tablet TAKE 1 TABLET BY MOUTH DAILY  . omeprazole (PRILOSEC) 40 MG capsule TAKE 1 CAPSULE(40 MG) BY MOUTH TWICE DAILY  . polyethylene glycol powder (GLYCOLAX/MIRALAX) 17 GM/SCOOP powder Take by mouth.  . psyllium (METAMUCIL) 58.6 % packet Take by mouth.  . rosuvastatin (CRESTOR) 5 MG tablet Take 1 tablet (5 mg total) by mouth daily.  . vitamin E 180 MG (400 UNITS) capsule Take by mouth.  Marland Kitchen ACCU-CHEK AVIVA PLUS test strip USE 1 STRIP TO TEST BLOOD SUGAR ONCE DAILY   No facility-administered encounter medications on file as of 07/26/2019.    Activities of Daily Living In your present state of health, do you have any difficulty performing the following activities:  07/26/2019 07/04/2019  Hearing? Y N  Comment has hearing aids -  Vision? N -  Comment corrected with glasses -  Difficulty concentrating or making decisions? N N  Walking or climbing stairs? Y Y  Dressing or bathing? N N  Doing errands, shopping? N N  Preparing Food and eating ? N -  Using the Toilet? N -  In the past six months, have you accidently leaked urine? N -  Do you have problems with loss of bowel control? N -  Managing your Medications? N -  Managing your Finances? N -  Housekeeping or managing your Housekeeping? N -  Some recent data might be hidden    Patient Care Team: Rochel Brome, MD as PCP - General (Family Medicine)    Assessment:   This is a routine wellness examination for  Mariann Laster.  Exercise Activities and Dietary recommendations Current Exercise Habits: Home exercise routine, Type of exercise: stretching, Time (Minutes): 15, Frequency (Times/Week): 2, Weekly Exercise (Minutes/Week): 30  Goals    . HEMOGLOBIN A1C < 7    . Obtain Annual Eye (retinal)  Exam      Patient will call to make an appointment with Dr Gilford Rile       Fall Risk Fall Risk  07/04/2019 10/10/2015  Falls in the past year? 0 No  Comment - Emmi Telephone Survey: data to providers prior to load  Number falls in past yr: 0 -  Injury with Fall? 0 -   Is the patient's home free of loose throw rugs in walkways, pet beds, electrical cords, etc?   yes      Grab bars in the bathroom? yes      Handrails on the stairs?   yes      Adequate lighting?   yes   Depression Screen PHQ 2/9 Scores 07/26/2019 07/04/2019  PHQ - 2 Score 0 0     Cognitive Function     6CIT Screen 07/26/2019  What Year? 0 points  What month? 0 points  What time? 0 points  Count back from 20 2 points  Months in reverse 2 points  Repeat phrase 2 points  Total Score 6    Immunization History  Administered Date(s) Administered  . PFIZER SARS-COV-2 Vaccination 04/07/2019, 05/05/2019  . Pneumococcal Conjugate-13  03/17/2016  . Pneumococcal Polysaccharide-23 08/28/2013  . Tdap 11/04/2016    Screening Tests Health Maintenance  Topic Date Due  . Hepatitis C Screening  Never done  . FOOT EXAM  Never done  . OPHTHALMOLOGY EXAM  Never done  . MAMMOGRAM  08/05/2019  . INFLUENZA VACCINE  09/10/2019  . HEMOGLOBIN A1C  01/04/2020  . DEXA SCAN  08/04/2020  . TETANUS/TDAP  11/05/2026  . COVID-19 Vaccine  Completed  . PNA vac Low Risk Adult  Completed    Cancer Screenings: Lung: Low Dose CT Chest recommended if Age 18-80 years, 30 pack-year currently smoking OR have quit w/in 15years. Patient does not qualify. Breast:  Up to date on Mammogram? Yes   Up to date of Bone Density/Dexa? Yes Colorectal: up to date     Plan:    Counseling was provided today regarding the following topics: healthy eating habits, home safety, vitamin and mineral supplementation (calcium and Vit D), regular exercise, breast self-exam, tobacco avoidance, limitation of alcohol intake, use of seat belts, firearm safety, and fall prevention.  Annual recommendations include: influenza vaccine, dental cleanings, and eye exams.  M[facility] has been scheduled at [facility]   I have personally reviewed and noted the following in the patient's chart:   . Medical and social history . Use of alcohol, tobacco or illicit drugs  . Current medications and supplements . Functional ability and status . Nutritional status . Physical activity . Advanced directives . List of other physicians . Hospitalizations, surgeries, and ER visits in previous 12 months . Vitals . Screenings to include cognitive, depression, and falls . Referrals and appointments  In addition, I have reviewed and discussed with patient certain preventive protocols, quality metrics, and best practice recommendations. A written personalized care plan for preventive services as well as general preventive health recommendations were provided to patient.      Erie Noe, LPN  04/24/1759

## 2019-08-07 DIAGNOSIS — Z9012 Acquired absence of left breast and nipple: Secondary | ICD-10-CM | POA: Diagnosis not present

## 2019-08-07 DIAGNOSIS — Z1231 Encounter for screening mammogram for malignant neoplasm of breast: Secondary | ICD-10-CM | POA: Diagnosis not present

## 2019-08-21 ENCOUNTER — Encounter: Payer: Self-pay | Admitting: Physician Assistant

## 2019-08-21 ENCOUNTER — Ambulatory Visit (INDEPENDENT_AMBULATORY_CARE_PROVIDER_SITE_OTHER): Payer: Medicare HMO | Admitting: Physician Assistant

## 2019-08-21 ENCOUNTER — Other Ambulatory Visit: Payer: Self-pay

## 2019-08-21 VITALS — BP 128/62 | HR 88 | Temp 97.5°F | Wt 168.0 lb

## 2019-08-21 DIAGNOSIS — R82998 Other abnormal findings in urine: Secondary | ICD-10-CM | POA: Diagnosis not present

## 2019-08-21 DIAGNOSIS — R1084 Generalized abdominal pain: Secondary | ICD-10-CM | POA: Insufficient documentation

## 2019-08-21 LAB — POCT URINALYSIS DIPSTICK
Bilirubin, UA: NEGATIVE
Blood, UA: NEGATIVE
Glucose, UA: NEGATIVE
Ketones, UA: NEGATIVE
Nitrite, UA: NEGATIVE
Protein, UA: NEGATIVE
Spec Grav, UA: 1.02 (ref 1.010–1.025)
Urobilinogen, UA: NEGATIVE E.U./dL — AB
pH, UA: 6 (ref 5.0–8.0)

## 2019-08-21 NOTE — Assessment & Plan Note (Signed)
Push water Urine culture pending

## 2019-08-21 NOTE — Progress Notes (Signed)
Acute Office Visit  Subjective:    Patient ID: Amy Mccann, female    DOB: [birth year], 79 y.o.   MRN: [id number]  Chief Complaint  Patient presents with  . Stomach bug    Diarrhea/Vomitting since last tuesday    HPI Patient is in today for gastroenteritis Pt states that last Monday and Tuesday she had diarrhea and nausea - states she has never actually had any vomiting - states appetite has been decreased She actually is feeling better today besides stomach being bloated She denies urine symptoms - denies hematochezia or melena Pt does have zofran  That she had forgotten she could take at home Pt has not been taking her omeprazole for the past several days either  Past Medical History:  Diagnosis Date  . Age-related osteoporosis without current pathological fracture 07/03/2019  . Cancer [facility]) 1990   Left Breast Cancer   . Cancer [facility]) 2017   Thyroid Cancer  . Cervicalgia 07/03/2019  . Diabetes mellitus without complication ([place])   . Generalized anxiety disorder   . GERD (gastroesophageal reflux disease)   . Hypertension   . Low back pain with sciatica 07/03/2019  . Obesity due to excess calories 07/03/2019    Past Surgical History:  Procedure Laterality Date  . [place] STUDY N/A 08/07/2013   Procedure: [place] STUDY;  Surgeon: Missy Sabins, MD;  [facility];  Service: Endoscopy;  Laterality: N/A;  . APPENDECTOMY  03/17/2016  . ESOPHAGEAL MANOMETRY N/A 08/07/2013   Procedure: ESOPHAGEAL MANOMETRY (EM);  Surgeon: Missy Sabins, MD;  [facility];  Service: Endoscopy;  Laterality: N/A;  . JOINT REPLACEMENT    . MASTECTOMY Left 1990    History reviewed. No pertinent family history.  Social History   Socioeconomic History  . Marital status: Divorced    Spouse name: Not on file  . Number of children: Not on file  . Years of education: Not on file  . Highest education level: Not on file  Occupational History  . Not on file  Tobacco Use  .  Smoking status: Never Smoker  . Smokeless tobacco: Never Used  Vaping Use  . Vaping Use: Never used  Substance and Sexual Activity  . Alcohol use: Never  . Drug use: Never  . Sexual activity: Not on file  Other Topics Concern  . Not on file  Social History Narrative  . Not on file   Social Determinants of Health   Financial Resource Strain:   . Difficulty of Paying Living Expenses:   Food Insecurity:   . Worried About Charity fundraiser in the Last Year:   . Arboriculturist in the Last Year:   Transportation Needs:   . Film/video editor (Medical):   Marland Kitchen Lack of Transportation (Non-Medical):   Physical Activity:   . Days of Exercise per Week:   . Minutes of Exercise per Session:   Stress:   . Feeling of Stress :   Social Connections:   . Frequency of Communication with Friends and Family:   . Frequency of Social Gatherings with Friends and Family:   . Attends Religious Services:   . Active Member of Clubs or Organizations:   . Attends Archivist Meetings:   Marland Kitchen Marital Status:   Intimate Partner Violence:   . Fear of Current or Ex-Partner:   . Emotionally Abused:   Marland Kitchen Physically Abused:   . Sexually Abused:  Current Outpatient Medications:  .  ACCU-CHEK AVIVA PLUS test strip, USE 1 STRIP TO TEST BLOOD SUGAR ONCE DAILY, Disp: 100 strip, Rfl: 3 .  ascorbic acid (VITAMIN C) 500 MG tablet, Take by mouth., Disp: , Rfl:  .  Ginger, Zingiber officinalis, (GINGER ROOT PO), Take by mouth., Disp: , Rfl:  .  ibuprofen (ADVIL) 600 MG tablet, , Disp: , Rfl:  .  levothyroxine (SYNTHROID) 112 MCG tablet, Take 1 tablet (112 mcg total) by mouth daily before breakfast., Disp: 90 tablet, Rfl: 1 .  losartan (COZAAR) 25 MG tablet, TAKE 1 TABLET BY MOUTH DAILY, Disp: 90 tablet, Rfl: 1 .  omeprazole (PRILOSEC) 40 MG capsule, TAKE 1 CAPSULE(40 MG) BY MOUTH TWICE DAILY, Disp: 180 capsule, Rfl: 1 .  polyethylene glycol powder (GLYCOLAX/MIRALAX) 17 GM/SCOOP powder, Take by  mouth., Disp: , Rfl:  .  psyllium (METAMUCIL) 58.6 % packet, Take by mouth., Disp: , Rfl:  .  rosuvastatin (CRESTOR) 5 MG tablet, Take 1 tablet (5 mg total) by mouth daily., Disp: 90 tablet, Rfl: 1 .  vitamin E 180 MG (400 UNITS) capsule, Take by mouth., Disp: , Rfl:    Allergies  Allergen Reactions  . Codeine Itching and Rash  . Penicillins Rash    CONSTITUTIONAL: Negative for chills, fatigue, fever, unintentional weight gain and unintentional weight loss.  E/N/T: Negative for ear pain, nasal congestion and sore throat.  CARDIOVASCULAR: Negative for chest pain, dizziness, palpitations and pedal edema.  RESPIRATORY: Negative for recent cough and dyspnea.  GASTROINTESTINAL: see HPI PSYCHIATRIC: Negative for sleep disturbance and to question depression screen.  Negative for depression, negative for anhedonia.         Objective:    PHYSICAL EXAM:   VS: BP 128/62 (BP Location: Left Arm, Patient Position: Sitting)   Pulse 88   Temp (!) 97.5 F (36.4 C) (Temporal)   Wt 168 lb (76.2 kg)   SpO2 96%   BMI 32.81 kg/m   GEN: Well nourished, well developed, in no acute distress  Cardiac: RRR; no murmurs, rubs, or gallops,no edema -  Respiratory:  normal respiratory rate and pattern with no distress - normal breath sounds with no rales, rhonchi, wheezes or rubs GI: normal bowel sounds, no masses - minimal generalized tenderness to palpation Skin: warm and dry, no rash  Neuro:  Alert and Oriented x 3, Strength and sensation are intact - CN II-Xii grossly intact    Wt Readings from Last 3 Encounters:  08/21/19 168 lb (76.2 kg)  07/26/19 167 lb (75.8 kg)  07/04/19 164 lb (74.4 kg)    Health Maintenance Due  Topic Date Due  . Hepatitis C Screening  Never done  . FOOT EXAM  Never done  . OPHTHALMOLOGY EXAM  Never done  . MAMMOGRAM  08/05/2019    There are no preventive care reminders to display for this patient.        Assessment & Plan:   Problem List Items Addressed  This Visit      Other   Generalized abdominal pain - Primary    Recommend clear liquids, bland diet and to take prilosec and zofran as directed labwork and urine culture pending Follow up if any symptoms change or worsen      Relevant Orders   POCT urinalysis dipstick   CBC with Differential/Platelet   Comprehensive metabolic panel   Leukocytes in urine    Push water Urine culture pending      Relevant Orders   Urine Culture  No orders of the defined types were placed in this encounter.    SARA R Berthel Bagnall, PA-C

## 2019-08-21 NOTE — Assessment & Plan Note (Signed)
Recommend clear liquids, bland diet and to take prilosec and zofran as directed labwork and urine culture pending Follow up if any symptoms change or worsen

## 2019-08-22 LAB — COMPREHENSIVE METABOLIC PANEL
ALT: 19 IU/L (ref 0–32)
AST: 20 IU/L (ref 0–40)
Albumin/Globulin Ratio: 1.6 (ref 1.2–2.2)
Albumin: 4.4 g/dL (ref 3.7–4.7)
Alkaline Phosphatase: 103 IU/L (ref 48–121)
BUN/Creatinine Ratio: 8 — ABNORMAL LOW (ref 12–28)
BUN: 9 mg/dL (ref 8–27)
Bilirubin Total: 0.4 mg/dL (ref 0.0–1.2)
CO2: 21 mmol/L (ref 20–29)
Calcium: 9.3 mg/dL (ref 8.7–10.3)
Chloride: 96 mmol/L (ref 96–106)
Creatinine, Ser: 1.06 mg/dL — ABNORMAL HIGH (ref 0.57–1.00)
GFR calc Af Amer: 58 mL/min/{1.73_m2} — ABNORMAL LOW (ref >59)
GFR calc non Af Amer: 50 mL/min/{1.73_m2} — ABNORMAL LOW (ref >59)
Globulin, Total: 2.8 g/dL (ref 1.5–4.5)
Glucose: 128 mg/dL — ABNORMAL HIGH (ref 65–99)
Potassium: 3.9 mmol/L (ref 3.5–5.2)
Sodium: 135 mmol/L (ref 134–144)
Total Protein: 7.2 g/dL (ref 6.0–8.5)

## 2019-08-22 LAB — CBC WITH DIFFERENTIAL/PLATELET
Basophils Absolute: 0.1 10*3/uL (ref 0.0–0.2)
Basos: 1 %
EOS (ABSOLUTE): 0.1 10*3/uL (ref 0.0–0.4)
Eos: 1 %
Hematocrit: 39.7 % (ref 34.0–46.6)
Hemoglobin: 13.7 g/dL (ref 11.1–15.9)
Immature Grans (Abs): 0 10*3/uL (ref 0.0–0.1)
Immature Granulocytes: 0 %
Lymphocytes Absolute: 1.8 10*3/uL (ref 0.7–3.1)
Lymphs: 21 %
MCH: 33.1 pg — ABNORMAL HIGH (ref 26.6–33.0)
MCHC: 34.5 g/dL (ref 31.5–35.7)
MCV: 96 fL (ref 79–97)
Monocytes Absolute: 0.7 10*3/uL (ref 0.1–0.9)
Monocytes: 8 %
Neutrophils Absolute: 6.1 10*3/uL (ref 1.4–7.0)
Neutrophils: 69 %
Platelets: 292 10*3/uL (ref 150–450)
RBC: 4.14 x10E6/uL (ref 3.77–5.28)
RDW: 12.3 % (ref 11.7–15.4)
WBC: 8.8 10*3/uL (ref 3.4–10.8)

## 2019-08-23 LAB — URINE CULTURE

## 2019-08-27 DIAGNOSIS — R109 Unspecified abdominal pain: Secondary | ICD-10-CM | POA: Diagnosis not present

## 2019-08-27 DIAGNOSIS — Z8639 Personal history of other endocrine, nutritional and metabolic disease: Secondary | ICD-10-CM | POA: Diagnosis not present

## 2019-08-27 DIAGNOSIS — D259 Leiomyoma of uterus, unspecified: Secondary | ICD-10-CM | POA: Diagnosis not present

## 2019-08-27 DIAGNOSIS — K760 Fatty (change of) liver, not elsewhere classified: Secondary | ICD-10-CM | POA: Diagnosis not present

## 2019-08-27 DIAGNOSIS — R0602 Shortness of breath: Secondary | ICD-10-CM | POA: Diagnosis not present

## 2019-08-27 DIAGNOSIS — D3500 Benign neoplasm of unspecified adrenal gland: Secondary | ICD-10-CM | POA: Diagnosis not present

## 2019-08-27 DIAGNOSIS — D3502 Benign neoplasm of left adrenal gland: Secondary | ICD-10-CM | POA: Diagnosis not present

## 2019-08-27 DIAGNOSIS — R112 Nausea with vomiting, unspecified: Secondary | ICD-10-CM | POA: Diagnosis not present

## 2019-08-27 DIAGNOSIS — R1084 Generalized abdominal pain: Secondary | ICD-10-CM | POA: Diagnosis not present

## 2019-08-27 DIAGNOSIS — R197 Diarrhea, unspecified: Secondary | ICD-10-CM | POA: Diagnosis not present

## 2019-08-28 DIAGNOSIS — R0602 Shortness of breath: Secondary | ICD-10-CM | POA: Diagnosis not present

## 2019-09-18 DIAGNOSIS — R1013 Epigastric pain: Secondary | ICD-10-CM | POA: Diagnosis not present

## 2019-09-18 DIAGNOSIS — K219 Gastro-esophageal reflux disease without esophagitis: Secondary | ICD-10-CM | POA: Diagnosis not present

## 2019-09-18 DIAGNOSIS — R110 Nausea: Secondary | ICD-10-CM | POA: Diagnosis not present

## 2019-09-20 ENCOUNTER — Other Ambulatory Visit: Payer: Self-pay | Admitting: Family Medicine

## 2019-09-21 DIAGNOSIS — E119 Type 2 diabetes mellitus without complications: Secondary | ICD-10-CM | POA: Diagnosis not present

## 2019-09-21 DIAGNOSIS — H353131 Nonexudative age-related macular degeneration, bilateral, early dry stage: Secondary | ICD-10-CM | POA: Diagnosis not present

## 2019-09-21 DIAGNOSIS — R112 Nausea with vomiting, unspecified: Secondary | ICD-10-CM | POA: Diagnosis not present

## 2019-09-21 DIAGNOSIS — H35342 Macular cyst, hole, or pseudohole, left eye: Secondary | ICD-10-CM | POA: Diagnosis not present

## 2019-09-21 DIAGNOSIS — H5211 Myopia, right eye: Secondary | ICD-10-CM | POA: Diagnosis not present

## 2019-09-21 DIAGNOSIS — K7689 Other specified diseases of liver: Secondary | ICD-10-CM | POA: Diagnosis not present

## 2019-09-21 DIAGNOSIS — H524 Presbyopia: Secondary | ICD-10-CM | POA: Diagnosis not present

## 2019-09-21 DIAGNOSIS — H52223 Regular astigmatism, bilateral: Secondary | ICD-10-CM | POA: Diagnosis not present

## 2019-09-21 DIAGNOSIS — Z9841 Cataract extraction status, right eye: Secondary | ICD-10-CM | POA: Diagnosis not present

## 2019-09-21 DIAGNOSIS — Z9842 Cataract extraction status, left eye: Secondary | ICD-10-CM | POA: Diagnosis not present

## 2019-09-21 DIAGNOSIS — R932 Abnormal findings on diagnostic imaging of liver and biliary tract: Secondary | ICD-10-CM | POA: Diagnosis not present

## 2019-09-21 DIAGNOSIS — R1011 Right upper quadrant pain: Secondary | ICD-10-CM | POA: Diagnosis not present

## 2019-09-21 DIAGNOSIS — Z961 Presence of intraocular lens: Secondary | ICD-10-CM | POA: Diagnosis not present

## 2019-09-21 LAB — HM DIABETES EYE EXAM

## 2019-09-23 DIAGNOSIS — Z1159 Encounter for screening for other viral diseases: Secondary | ICD-10-CM | POA: Diagnosis not present

## 2019-09-29 DIAGNOSIS — K449 Diaphragmatic hernia without obstruction or gangrene: Secondary | ICD-10-CM | POA: Diagnosis not present

## 2019-09-29 DIAGNOSIS — Z79899 Other long term (current) drug therapy: Secondary | ICD-10-CM | POA: Diagnosis not present

## 2019-09-29 DIAGNOSIS — R1012 Left upper quadrant pain: Secondary | ICD-10-CM | POA: Diagnosis not present

## 2019-09-29 DIAGNOSIS — K219 Gastro-esophageal reflux disease without esophagitis: Secondary | ICD-10-CM | POA: Diagnosis not present

## 2019-09-29 DIAGNOSIS — R112 Nausea with vomiting, unspecified: Secondary | ICD-10-CM | POA: Diagnosis not present

## 2019-09-29 DIAGNOSIS — R110 Nausea: Secondary | ICD-10-CM | POA: Diagnosis not present

## 2019-09-29 DIAGNOSIS — M1990 Unspecified osteoarthritis, unspecified site: Secondary | ICD-10-CM | POA: Diagnosis not present

## 2019-09-29 DIAGNOSIS — E119 Type 2 diabetes mellitus without complications: Secondary | ICD-10-CM | POA: Diagnosis not present

## 2019-09-29 DIAGNOSIS — R1013 Epigastric pain: Secondary | ICD-10-CM | POA: Diagnosis not present

## 2019-10-02 DIAGNOSIS — M47816 Spondylosis without myelopathy or radiculopathy, lumbar region: Secondary | ICD-10-CM | POA: Diagnosis not present

## 2019-10-23 DIAGNOSIS — Z853 Personal history of malignant neoplasm of breast: Secondary | ICD-10-CM | POA: Diagnosis not present

## 2019-10-23 DIAGNOSIS — C73 Malignant neoplasm of thyroid gland: Secondary | ICD-10-CM | POA: Diagnosis not present

## 2019-11-03 ENCOUNTER — Ambulatory Visit (INDEPENDENT_AMBULATORY_CARE_PROVIDER_SITE_OTHER): Payer: Medicare HMO | Admitting: Family Medicine

## 2019-11-03 ENCOUNTER — Other Ambulatory Visit: Payer: Self-pay

## 2019-11-03 VITALS — BP 128/60 | HR 71 | Temp 97.2°F | Ht 60.0 in | Wt 159.0 lb

## 2019-11-03 DIAGNOSIS — Z23 Encounter for immunization: Secondary | ICD-10-CM

## 2019-11-03 DIAGNOSIS — I10 Essential (primary) hypertension: Secondary | ICD-10-CM

## 2019-11-03 DIAGNOSIS — E785 Hyperlipidemia, unspecified: Secondary | ICD-10-CM | POA: Diagnosis not present

## 2019-11-03 DIAGNOSIS — E782 Mixed hyperlipidemia: Secondary | ICD-10-CM

## 2019-11-03 DIAGNOSIS — E1169 Type 2 diabetes mellitus with other specified complication: Secondary | ICD-10-CM

## 2019-11-03 DIAGNOSIS — E890 Postprocedural hypothyroidism: Secondary | ICD-10-CM | POA: Diagnosis not present

## 2019-11-03 DIAGNOSIS — K219 Gastro-esophageal reflux disease without esophagitis: Secondary | ICD-10-CM

## 2019-11-03 NOTE — Progress Notes (Signed)
i   Subjective:  Patient ID: Amy Mccann, female    DOB: [birth year]  Age: 79 y.o. MRN: [id number]  Chief Complaint  Patient presents with  . Hyperlipidemia  . Hypertension  . Diabetes    HPI  Pt presents for follow up of hypertension.  Her current cardiac medication regimen includes losartan.  She is tolerating the medication well without side effects.  Compliance with treatment has been good; she takes her medication as directed and follows up as directed.      Pt presents with hyperlipidemia.  Current treatment includes Crestor.  Compliance with treatment has been good; she takes her medication as directed, maintains her low cholesterol diet, follows up as directed, and maintains her exercise regimen.  She denies experiencing any hypercholesterolemia related symptoms.      Concerning type 2 diabetes mellitus with diabetic polyneuropathy, specifically, this is type 2, non-insulin requiring diabetes, complicated by peripheral neuropathy.  Compliance with treatment has been good; she maintains her diet and exercise regimen, follows up as directed, and is keeping a glucose diary.  Date of diagnosis 2006.  Typical diet includes low carbohydrate.  She follows a prescribed diet.  Current meds include none.  She reports home blood glucose readings have averaged fasting readings in the 120-140 mg/dL range. She checks her glucose 1 times daily.  In regard to preventative care, she performs foot self-exams daily and is sees Dr. Gilford Rile.      Dx with low back pain; her symptoms are unchanged since last visit.  Lumbar MRI was abnormal. She has seen Dr. Maryjean Ka. Patient saw Dr. Ronnald Ramp first. She has had some ESI shots. They helped. Had more ESI in 10/02/2019 with a different doctor. Did not help as much. Patient takes tylenol 500 mg 1-2 per day. Ibuprofen does not help much.   GERD/Gastritis: pt saw Dr. Lyda Jester. Changed prilosec to nexium which has helped. Has had occasional diarrhea, but not  constipated. Taking miralax prn.   Past Medical History:  Diagnosis Date  . Age-related osteoporosis without current pathological fracture 07/03/2019  . Cancer [facility]) 1990   Left Breast Cancer   . Cancer [facility]) 2017   Thyroid Cancer  . Cervicalgia 07/03/2019  . Diabetes mellitus without complication ([place])   . Generalized anxiety disorder   . GERD (gastroesophageal reflux disease)   . Hypertension   . Low back pain with sciatica 07/03/2019  . Obesity due to excess calories 07/03/2019   Past Surgical History:  Procedure Laterality Date  . [place] STUDY N/A 08/07/2013   Procedure: [place] STUDY;  Surgeon: Missy Sabins, MD;  [facility];  Service: Endoscopy;  Laterality: N/A;  . APPENDECTOMY  03/17/2016  . ESOPHAGEAL MANOMETRY N/A 08/07/2013   Procedure: ESOPHAGEAL MANOMETRY (EM);  Surgeon: Missy Sabins, MD;  [facility];  Service: Endoscopy;  Laterality: N/A;  . JOINT REPLACEMENT    . MASTECTOMY Left 1990    History reviewed. No pertinent family history. Social History   Socioeconomic History  . Marital status: Divorced    Spouse name: Not on file  . Number of children: Not on file  . Years of education: Not on file  . Highest education level: Not on file  Occupational History  . Not on file  Tobacco Use  . Smoking status: Never Smoker  . Smokeless tobacco: Never Used  Vaping Use  . Vaping Use: Never used  Substance and Sexual Activity  . Alcohol use: Never  . Drug  use: Never  . Sexual activity: Not on file  Other Topics Concern  . Not on file  Social History Narrative  . Not on file   Social Determinants of Health   Financial Resource Strain:   . Difficulty of Paying Living Expenses: Not on file  Food Insecurity:   . Worried About Charity fundraiser in the Last Year: Not on file  . Ran Out of Food in the Last Year: Not on file  Transportation Needs:   . Lack of Transportation (Medical): Not on file  . Lack of Transportation  (Non-Medical): Not on file  Physical Activity:   . Days of Exercise per Week: Not on file  . Minutes of Exercise per Session: Not on file  Stress:   . Feeling of Stress : Not on file  Social Connections:   . Frequency of Communication with Friends and Family: Not on file  . Frequency of Social Gatherings with Friends and Family: Not on file  . Attends Religious Services: Not on file  . Active Member of Clubs or Organizations: Not on file  . Attends Archivist Meetings: Not on file  . Marital Status: Not on file    Review of Systems  Constitutional: Negative for chills, fatigue and fever.  HENT: Negative for congestion, ear pain, rhinorrhea and sore throat.   Respiratory: Positive for shortness of breath (with exertion. occasional. Not associated with chest pain. ). Negative for cough.   Cardiovascular: Negative for chest pain.  Gastrointestinal: Positive for diarrhea (Improved. Started Sunday through yesterday.). Negative for abdominal pain, constipation, nausea and vomiting.  Endocrine: Positive for polydipsia and polyuria. Negative for polyphagia.  Genitourinary: Negative for dysuria and urgency.  Musculoskeletal: Positive for arthralgias (Bilateral knees.), back pain, gait problem and myalgias.  Neurological: Positive for headaches. Negative for dizziness, weakness and light-headedness.  Psychiatric/Behavioral: Negative for dysphoric mood. The patient is not nervous/anxious.      Objective:  BP 128/60   Pulse 71   Temp (!) 97.2 F (36.2 C)   Ht 5' (1.524 m)   Wt 159 lb (72.1 kg)   SpO2 98%   BMI 31.05 kg/m   BP/Weight 11/03/2019 08/21/2019 6/60/6301  Systolic BP 601 093 235  Diastolic BP 60 62 78  Wt. (Lbs) 159 168 167  BMI 31.05 32.81 32.61    Physical Exam Vitals reviewed.  Constitutional:      Appearance: Normal appearance. She is normal weight.  Neck:     Vascular: No carotid bruit.  Cardiovascular:     Rate and Rhythm: Normal rate and regular  rhythm.     Pulses: Normal pulses.     Heart sounds: Normal heart sounds.  Pulmonary:     Effort: Pulmonary effort is normal. No respiratory distress.     Breath sounds: Normal breath sounds.  Abdominal:     General: Abdomen is flat. Bowel sounds are normal.     Palpations: Abdomen is soft.     Tenderness: There is no abdominal tenderness.  Neurological:     Mental Status: She is alert and oriented to person, place, and time.  Psychiatric:        Mood and Affect: Mood normal.        Behavior: Behavior normal.     Diabetic Foot Exam - Simple   Simple Foot Form Diabetic Foot exam was performed with the following findings: Yes 11/03/2019  9:44 AM  Visual Inspection See comments: Yes Sensation Testing Intact to touch and monofilament testing  bilaterally: Yes Pulse Check Posterior Tibialis and Dorsalis pulse intact bilaterally: Yes Comments Bunions on BL feet      Lab Results  Component Value Date   WBC 7.9 11/03/2019   HGB 14.2 11/03/2019   HCT 42.2 11/03/2019   PLT 291 11/03/2019   GLUCOSE 109 (H) 11/03/2019   CHOL 168 11/03/2019   TRIG 122 11/03/2019   HDL 68 11/03/2019   LDLCALC 79 11/03/2019   ALT 23 11/03/2019   AST 18 11/03/2019   NA 132 (L) 11/03/2019   K 4.1 11/03/2019   CL 96 11/03/2019   CREATININE 0.81 11/03/2019   BUN 12 11/03/2019   CO2 22 11/03/2019   INR 0.99 01/21/2010   HGBA1C 6.3 (H) 11/03/2019   MICROALBUR 30 04/05/2019      Assessment & Plan:   1. Essential hypertension, benign Well controlled.  No changes to medicines.  Continue to work on eating a healthy diet and exercise.  Labs drawn today.  - Comprehensive metabolic panel  2. Hyperlipidemia associated with type 2 diabetes mellitus ([place]) AN INDIVIDUALIZED CARE PLAN: was established or reinforced today.  The patient's disease status was assessed using clinical findings on exam, labs, and/or other diagnostic testing, such as xrays, to determine his/her success in meeting treatment  goals based on disease specific evidence-based guidelines and found to be well controlled.   LAB ORDERS: hgbA1C, Comprehensive metabolic profile.   MEDICATIONS: (see today's med list) A CLINICAL SUMMARY including a written plan a patient's and the patient's families understanding of their medical issues and care needs and identify barriers to care unique to individual due to social or financial issues and help create solutions together.   RECOMMENDATIONS: a daily aspirin, adherance to a diabetic diet, a graduated exercise program, HgbA1C level checked quarterly, urine microalbumin test yearly, LDL cholesterol test yearly, unless uncontrolled lipids and then every 3 months until controlled., annual monofilament test for evaluating sensation in feet, daily foot self-inspection, lower blood pressure, yearly dental exams, need for yearly flu shots, and smoking cessation ( if a smoker is recommended and assistance is offered. ).  Self Management goals as discussed in collaboration with the patient includes eating a diabetic diet, as well as getting regular physical activity.   FOLLOW-UP: Schedule a follow-up visit in 3 months.  - Hemoglobin A1c - CBC with Differential/Platelet  3. Postoperative hypothyroidism - hx of thyroid cancer. Stable. The current medical regimen is effective;  continue present plan and medications.  4. Mixed hyperlipidemia Well controlled.  No changes to medicines.  Continue to work on eating a healthy diet and exercise.  Labs drawn today.  - Lipid panel  5. GERD without esophagitis The current medical regimen is effective;  continue present plan and medications. - esomeprazole (NEXIUM) 40 MG capsule; Take 40 mg by mouth daily.   6. Need for immunization against influenza - Flu Vaccine QUAD High Dose(Fluad)  Follow-up: Return in about 4 months (around 03/04/2020).  An After Visit Summary was printed and given to the patient.  Rochel Brome [facility] [phone]

## 2019-11-04 ENCOUNTER — Encounter: Payer: Self-pay | Admitting: Family Medicine

## 2019-11-04 LAB — CBC WITH DIFFERENTIAL/PLATELET
Basophils Absolute: 0.1 10*3/uL (ref 0.0–0.2)
Basos: 1 %
EOS (ABSOLUTE): 0.2 10*3/uL (ref 0.0–0.4)
Eos: 2 %
Hematocrit: 42.2 % (ref 34.0–46.6)
Hemoglobin: 14.2 g/dL (ref 11.1–15.9)
Immature Grans (Abs): 0 10*3/uL (ref 0.0–0.1)
Immature Granulocytes: 0 %
Lymphocytes Absolute: 1.3 10*3/uL (ref 0.7–3.1)
Lymphs: 16 %
MCH: 32.1 pg (ref 26.6–33.0)
MCHC: 33.6 g/dL (ref 31.5–35.7)
MCV: 96 fL (ref 79–97)
Monocytes Absolute: 0.6 10*3/uL (ref 0.1–0.9)
Monocytes: 8 %
Neutrophils Absolute: 5.7 10*3/uL (ref 1.4–7.0)
Neutrophils: 73 %
Platelets: 291 10*3/uL (ref 150–450)
RBC: 4.42 x10E6/uL (ref 3.77–5.28)
RDW: 12.9 % (ref 11.7–15.4)
WBC: 7.9 10*3/uL (ref 3.4–10.8)

## 2019-11-04 LAB — COMPREHENSIVE METABOLIC PANEL
ALT: 23 IU/L (ref 0–32)
AST: 18 IU/L (ref 0–40)
Albumin/Globulin Ratio: 1.9 (ref 1.2–2.2)
Albumin: 4.5 g/dL (ref 3.7–4.7)
Alkaline Phosphatase: 115 IU/L (ref 44–121)
BUN/Creatinine Ratio: 15 (ref 12–28)
BUN: 12 mg/dL (ref 8–27)
Bilirubin Total: 0.5 mg/dL (ref 0.0–1.2)
CO2: 22 mmol/L (ref 20–29)
Calcium: 9.4 mg/dL (ref 8.7–10.3)
Chloride: 96 mmol/L (ref 96–106)
Creatinine, Ser: 0.81 mg/dL (ref 0.57–1.00)
GFR calc Af Amer: 80 mL/min/{1.73_m2} (ref >59)
GFR calc non Af Amer: 70 mL/min/{1.73_m2} (ref >59)
Globulin, Total: 2.4 g/dL (ref 1.5–4.5)
Glucose: 109 mg/dL — ABNORMAL HIGH (ref 65–99)
Potassium: 4.1 mmol/L (ref 3.5–5.2)
Sodium: 132 mmol/L — ABNORMAL LOW (ref 134–144)
Total Protein: 6.9 g/dL (ref 6.0–8.5)

## 2019-11-04 LAB — CARDIOVASCULAR RISK ASSESSMENT

## 2019-11-04 LAB — LIPID PANEL
Chol/HDL Ratio: 2.5 ratio (ref 0.0–4.4)
Cholesterol, Total: 168 mg/dL (ref 100–199)
HDL: 68 mg/dL (ref >39)
LDL Chol Calc (NIH): 79 mg/dL (ref 0–99)
Triglycerides: 122 mg/dL (ref 0–149)
VLDL Cholesterol Cal: 21 mg/dL (ref 5–40)

## 2019-11-04 LAB — HEMOGLOBIN A1C
Est. average glucose Bld gHb Est-mCnc: 134 mg/dL
Hgb A1c MFr Bld: 6.3 % — ABNORMAL HIGH (ref 4.8–5.6)

## 2019-11-27 DIAGNOSIS — R1013 Epigastric pain: Secondary | ICD-10-CM | POA: Diagnosis not present

## 2019-11-27 DIAGNOSIS — K219 Gastro-esophageal reflux disease without esophagitis: Secondary | ICD-10-CM | POA: Diagnosis not present

## 2019-11-27 DIAGNOSIS — R110 Nausea: Secondary | ICD-10-CM | POA: Diagnosis not present

## 2019-12-06 DIAGNOSIS — Z853 Personal history of malignant neoplasm of breast: Secondary | ICD-10-CM | POA: Diagnosis not present

## 2019-12-06 DIAGNOSIS — C50912 Malignant neoplasm of unspecified site of left female breast: Secondary | ICD-10-CM | POA: Diagnosis not present

## 2019-12-12 ENCOUNTER — Other Ambulatory Visit: Payer: Self-pay | Admitting: Family Medicine

## 2019-12-15 DIAGNOSIS — C50912 Malignant neoplasm of unspecified site of left female breast: Secondary | ICD-10-CM | POA: Diagnosis not present

## 2019-12-15 DIAGNOSIS — Z853 Personal history of malignant neoplasm of breast: Secondary | ICD-10-CM | POA: Diagnosis not present

## 2020-01-16 ENCOUNTER — Other Ambulatory Visit: Payer: Self-pay | Admitting: Family Medicine

## 2020-01-29 DIAGNOSIS — H538 Other visual disturbances: Secondary | ICD-10-CM | POA: Diagnosis not present

## 2020-01-29 DIAGNOSIS — S199XXA Unspecified injury of neck, initial encounter: Secondary | ICD-10-CM | POA: Diagnosis not present

## 2020-01-29 DIAGNOSIS — G9389 Other specified disorders of brain: Secondary | ICD-10-CM | POA: Diagnosis not present

## 2020-01-29 DIAGNOSIS — S0990XA Unspecified injury of head, initial encounter: Secondary | ICD-10-CM | POA: Diagnosis not present

## 2020-03-04 ENCOUNTER — Encounter: Payer: Self-pay | Admitting: Family Medicine

## 2020-03-04 NOTE — Progress Notes (Signed)
Subjective:  Patient ID: Amy Mccann, female    DOB: December 20, 1940  Age: 80 y.o. MRN: [id number]  Chief Complaint  Patient presents with  . Hypertension  . Diabetes    HPI DM complicated by hyperlipidemia -checks FBS daily, ranges from 120-140, checks feet daily. Eating light. Walking for exercise.  Hypertension-losartan 25 mg once daily. Hyperlipidemia-Crestor 5 mg once daily. Low fat diet.  Hypothyroidism-Synthroid 112 mcg once daily in am. GERD-Nexium 40 mg once daily. HA's- hot head with hood on 01/31/2020, checked out at urgent care, CT Brain-Normal, She still has intermittent HA's. Lumbar pain (ESI x 6) x 2 tylenol helps  Current Outpatient Medications on File Prior to Visit  Medication Sig Dispense Refill  . ascorbic acid (VITAMIN C) 500 MG tablet Take by mouth.    . esomeprazole (NEXIUM) 40 MG capsule Take 40 mg by mouth daily.    . Ginger, Zingiber officinalis, (GINGER ROOT PO) Take by mouth.    Marland Kitchen ibuprofen (ADVIL) 600 MG tablet     . levothyroxine (SYNTHROID) 112 MCG tablet TAKE 1 TABLET(112 MCG) BY MOUTH DAILY BEFORE BREAKFAST 90 tablet 1  . losartan (COZAAR) 25 MG tablet TAKE 1 TABLET BY MOUTH DAILY 90 tablet 1  . polyethylene glycol powder (GLYCOLAX/MIRALAX) 17 GM/SCOOP powder Take by mouth.    . psyllium (METAMUCIL) 58.6 % packet Take by mouth.    . rosuvastatin (CRESTOR) 5 MG tablet TAKE 1 TABLET BY MOUTH DAILY 90 tablet 1  . vitamin E 180 MG (400 UNITS) capsule Take by mouth.     No current facility-administered medications on file prior to visit.   Past Medical History:  Diagnosis Date  . Age-related osteoporosis without current pathological fracture 07/03/2019  . Cancer [facility]) 1990   Left Breast Cancer   . Cancer [facility]) 2017   Thyroid Cancer  . Cervicalgia 07/03/2019  . Diabetes mellitus without complication ([place])   . Generalized anxiety disorder   . GERD (gastroesophageal reflux disease)   . Hypertension   . Low back pain with sciatica 07/03/2019  .  Obesity due to excess calories 07/03/2019  . S/P repair of paraesophageal hernia 10/20/2013  . S/P TKR (total knee replacement) 07/25/2012   Past Surgical History:  Procedure Laterality Date  . [place] STUDY N/A 08/07/2013   Procedure: [place] STUDY;  Surgeon: Missy Sabins, MD;  [facility];  Service: Endoscopy;  Laterality: N/A;  . APPENDECTOMY  03/17/2016  . ESOPHAGEAL MANOMETRY N/A 08/07/2013   Procedure: ESOPHAGEAL MANOMETRY (EM);  Surgeon: Missy Sabins, MD;  [facility];  Service: Endoscopy;  Laterality: N/A;  . JOINT REPLACEMENT    . MASTECTOMY Left 1990    No family history on file. Social History   Socioeconomic History  . Marital status: Divorced    Spouse name: Not on file  . Number of children: Not on file  . Years of education: Not on file  . Highest education level: Not on file  Occupational History  . Not on file  Tobacco Use  . Smoking status: Never Smoker  . Smokeless tobacco: Never Used  Vaping Use  . Vaping Use: Never used  Substance and Sexual Activity  . Alcohol use: Never  . Drug use: Never  . Sexual activity: Not on file  Other Topics Concern  . Not on file  Social History Narrative  . Not on file   Social Determinants of Health   Financial Resource Strain: Not on file  Food  Insecurity: Not on file  Transportation Needs: Not on file  Physical Activity: Not on file  Stress: Not on file  Social Connections: Not on file    Review of Systems  Constitutional: Positive for fatigue. Negative for chills and fever.  HENT: Negative for congestion, rhinorrhea and sore throat.   Eyes: Positive for visual disturbance.  Respiratory: Positive for shortness of breath (with walking). Negative for cough.   Cardiovascular: Negative for chest pain.  Gastrointestinal: Negative for abdominal pain, constipation, diarrhea, nausea and vomiting.  Endocrine: Positive for polydipsia.  Genitourinary: Negative for dysuria and urgency.   Musculoskeletal: Positive for arthralgias, back pain and myalgias.  Neurological: Positive for headaches. Negative for dizziness, weakness and light-headedness.  Psychiatric/Behavioral: Negative for dysphoric mood. The patient is not nervous/anxious.      Objective:  BP 124/64   Pulse 84   Temp 97.7 F (36.5 C)   Resp 18   Ht 5' (1.524 m)   Wt 161 lb (73 kg)   BMI 31.44 kg/m   BP/Weight 03/05/2020 11/03/2019 8/85/0277  Systolic BP 412 878 676  Diastolic BP 64 60 62  Wt. (Lbs) 161 159 168  BMI 31.44 31.05 32.81    Physical Exam Vitals reviewed.  Constitutional:      Appearance: Normal appearance.  Cardiovascular:     Rate and Rhythm: Normal rate and regular rhythm.  Pulmonary:     Effort: Pulmonary effort is normal.     Breath sounds: Normal breath sounds.  Abdominal:     General: Bowel sounds are normal.  Musculoskeletal:        General: Normal range of motion.     Cervical back: Normal range of motion.  Skin:    General: Skin is warm.  Neurological:     Mental Status: She is alert.  Psychiatric:        Mood and Affect: Mood normal.        Behavior: Behavior normal.     Diabetic Foot Exam - Simple   Simple Foot Form Diabetic Foot exam was performed with the following findings: Yes 03/05/2020 11:04 AM  Visual Inspection See comments: Yes Sensation Testing Intact to touch and monofilament testing bilaterally: Yes Pulse Check Posterior Tibialis and Dorsalis pulse intact bilaterally: Yes Comments BL feet- Calluses Rt-Bunion Lt-2nd digit hammer toe      Lab Results  Component Value Date   WBC 8.3 03/05/2020   HGB 13.4 03/05/2020   HCT 39.2 03/05/2020   PLT 286 03/05/2020   GLUCOSE 117 (H) 03/05/2020   CHOL 166 03/05/2020   TRIG 148 03/05/2020   HDL 61 03/05/2020   LDLCALC 80 03/05/2020   ALT 19 03/05/2020   AST 22 03/05/2020   NA 135 03/05/2020   K 4.4 03/05/2020   CL 99 03/05/2020   CREATININE 0.90 03/05/2020   BUN 12 03/05/2020   CO2 18  (L) 03/05/2020   INR 0.99 01/21/2010   HGBA1C 6.4 (H) 03/05/2020   MICROALBUR 30 04/05/2019      Assessment & Plan:   1. Essential hypertension, benign The current medical regimen is effective;  continue present plan and medications. - Comprehensive metabolic panel - CBC with Differential/Platelet  2. Hyperlipidemia associated with type 2 diabetes mellitus ([place]) Control: good Recommend check sugars fasting daily. Recommend check feet daily. Recommend annual eye exams. Medicines: none. Continue to work on eating a healthy diet and exercise.  Labs drawn today.   - Hemoglobin A1c  3. Mixed hyperlipidemia Well controlled.  No changes  to medicines.  Continue to work on eating a healthy diet and exercise.  Labs drawn today.  - Lipid panel  4. GERD without esophagitis The current medical regimen is effective;  continue present plan and medications.  5. Age-related osteoporosis without current pathological fracture Recommend calcium with vitamin D.   6. Other specified hypothyroidism The current medical regimen is effective;  continue present plan and medications.  7. Class 1 obesity due to excess calories with serious comorbidity and body mass index (BMI) of 31.0 to 31.9 in adult  Low fat, diabetic diet.   Meds ordered this encounter  Medications  . glucose blood (ACCU-CHEK AVIVA PLUS) test strip    Sig: Once daily fasting    Dispense:  100 strip    Refill:  3    Orders Placed This Encounter  Procedures  . Comprehensive metabolic panel  . Hemoglobin A1c  . Lipid panel  . CBC with Differential/Platelet  . Cardiovascular Risk Assessment     Follow-up: Return in about 6 months (around 09/02/2020) for fasting. Marland Kitchen  An After Visit Summary was printed and given to the patient.  Rochel Brome, MD [facility] [phone]

## 2020-03-05 ENCOUNTER — Ambulatory Visit (INDEPENDENT_AMBULATORY_CARE_PROVIDER_SITE_OTHER): Payer: Medicare HMO | Admitting: Family Medicine

## 2020-03-05 ENCOUNTER — Other Ambulatory Visit: Payer: Self-pay

## 2020-03-05 VITALS — BP 124/64 | HR 84 | Temp 97.7°F | Resp 18 | Ht 60.0 in | Wt 161.0 lb

## 2020-03-05 DIAGNOSIS — E1169 Type 2 diabetes mellitus with other specified complication: Secondary | ICD-10-CM | POA: Diagnosis not present

## 2020-03-05 DIAGNOSIS — I10 Essential (primary) hypertension: Secondary | ICD-10-CM | POA: Diagnosis not present

## 2020-03-05 DIAGNOSIS — E782 Mixed hyperlipidemia: Secondary | ICD-10-CM | POA: Diagnosis not present

## 2020-03-05 DIAGNOSIS — E785 Hyperlipidemia, unspecified: Secondary | ICD-10-CM

## 2020-03-05 DIAGNOSIS — K219 Gastro-esophageal reflux disease without esophagitis: Secondary | ICD-10-CM | POA: Diagnosis not present

## 2020-03-05 DIAGNOSIS — E6609 Other obesity due to excess calories: Secondary | ICD-10-CM | POA: Diagnosis not present

## 2020-03-05 DIAGNOSIS — Z6831 Body mass index (BMI) 31.0-31.9, adult: Secondary | ICD-10-CM

## 2020-03-05 DIAGNOSIS — M810 Age-related osteoporosis without current pathological fracture: Secondary | ICD-10-CM

## 2020-03-05 DIAGNOSIS — E038 Other specified hypothyroidism: Secondary | ICD-10-CM

## 2020-03-05 MED ORDER — ACCU-CHEK AVIVA PLUS VI STRP: ORAL_STRIP | 3 refills | Status: DC

## 2020-03-06 LAB — CBC WITH DIFFERENTIAL/PLATELET
Basophils Absolute: 0.1 10*3/uL (ref 0.0–0.2)
Basos: 1 %
EOS (ABSOLUTE): 0.1 10*3/uL (ref 0.0–0.4)
Eos: 1 %
Hematocrit: 39.2 % (ref 34.0–46.6)
Hemoglobin: 13.4 g/dL (ref 11.1–15.9)
Immature Grans (Abs): 0 10*3/uL (ref 0.0–0.1)
Immature Granulocytes: 0 %
Lymphocytes Absolute: 1.6 10*3/uL (ref 0.7–3.1)
Lymphs: 19 %
MCH: 32.3 pg (ref 26.6–33.0)
MCHC: 34.2 g/dL (ref 31.5–35.7)
MCV: 95 fL (ref 79–97)
Monocytes Absolute: 0.8 10*3/uL (ref 0.1–0.9)
Monocytes: 9 %
Neutrophils Absolute: 5.8 10*3/uL (ref 1.4–7.0)
Neutrophils: 70 %
Platelets: 286 10*3/uL (ref 150–450)
RBC: 4.15 x10E6/uL (ref 3.77–5.28)
RDW: 12.9 % (ref 11.7–15.4)
WBC: 8.3 10*3/uL (ref 3.4–10.8)

## 2020-03-06 LAB — COMPREHENSIVE METABOLIC PANEL
ALT: 19 IU/L (ref 0–32)
AST: 22 IU/L (ref 0–40)
Albumin/Globulin Ratio: 1.8 (ref 1.2–2.2)
Albumin: 4.4 g/dL (ref 3.7–4.7)
Alkaline Phosphatase: 100 IU/L (ref 44–121)
BUN/Creatinine Ratio: 13 (ref 12–28)
BUN: 12 mg/dL (ref 8–27)
Bilirubin Total: 0.4 mg/dL (ref 0.0–1.2)
CO2: 18 mmol/L — ABNORMAL LOW (ref 20–29)
Calcium: 9.8 mg/dL (ref 8.7–10.3)
Chloride: 99 mmol/L (ref 96–106)
Creatinine, Ser: 0.9 mg/dL (ref 0.57–1.00)
GFR calc Af Amer: 70 mL/min/{1.73_m2} (ref >59)
GFR calc non Af Amer: 61 mL/min/{1.73_m2} (ref >59)
Globulin, Total: 2.5 g/dL (ref 1.5–4.5)
Glucose: 117 mg/dL — ABNORMAL HIGH (ref 65–99)
Potassium: 4.4 mmol/L (ref 3.5–5.2)
Sodium: 135 mmol/L (ref 134–144)
Total Protein: 6.9 g/dL (ref 6.0–8.5)

## 2020-03-06 LAB — HEMOGLOBIN A1C
Est. average glucose Bld gHb Est-mCnc: 137 mg/dL
Hgb A1c MFr Bld: 6.4 % — ABNORMAL HIGH (ref 4.8–5.6)

## 2020-03-06 LAB — LIPID PANEL
Chol/HDL Ratio: 2.7 ratio (ref 0.0–4.4)
Cholesterol, Total: 166 mg/dL (ref 100–199)
HDL: 61 mg/dL (ref >39)
LDL Chol Calc (NIH): 80 mg/dL (ref 0–99)
Triglycerides: 148 mg/dL (ref 0–149)
VLDL Cholesterol Cal: 25 mg/dL (ref 5–40)

## 2020-03-06 LAB — CARDIOVASCULAR RISK ASSESSMENT

## 2020-03-09 ENCOUNTER — Encounter: Payer: Self-pay | Admitting: Family Medicine

## 2020-03-11 ENCOUNTER — Other Ambulatory Visit: Payer: Self-pay | Admitting: Family Medicine

## 2020-03-16 DIAGNOSIS — Z20828 Contact with and (suspected) exposure to other viral communicable diseases: Secondary | ICD-10-CM | POA: Diagnosis not present

## 2020-04-01 DIAGNOSIS — C73 Malignant neoplasm of thyroid gland: Secondary | ICD-10-CM | POA: Diagnosis not present

## 2020-04-01 DIAGNOSIS — E871 Hypo-osmolality and hyponatremia: Secondary | ICD-10-CM | POA: Diagnosis not present

## 2020-04-01 DIAGNOSIS — E890 Postprocedural hypothyroidism: Secondary | ICD-10-CM | POA: Diagnosis not present

## 2020-06-09 ENCOUNTER — Other Ambulatory Visit: Payer: Self-pay | Admitting: Family Medicine

## 2020-06-10 ENCOUNTER — Encounter: Payer: Self-pay | Admitting: Nurse Practitioner

## 2020-06-10 ENCOUNTER — Telehealth (INDEPENDENT_AMBULATORY_CARE_PROVIDER_SITE_OTHER): Payer: Medicare HMO | Admitting: Nurse Practitioner

## 2020-06-10 VITALS — Ht 60.0 in | Wt 161.0 lb

## 2020-06-10 DIAGNOSIS — M19041 Primary osteoarthritis, right hand: Secondary | ICD-10-CM

## 2020-06-10 DIAGNOSIS — M19042 Primary osteoarthritis, left hand: Secondary | ICD-10-CM

## 2020-06-10 NOTE — Progress Notes (Deleted)
Virtual Visit via Telephone Note   This visit type was conducted due to national recommendations for restrictions regarding the COVID-19 Pandemic (e.g. social distancing) in an effort to limit this patient's exposure and mitigate transmission in our community.  Due to her co-morbid illnesses, this patient is at least at moderate risk for complications without adequate follow up.  This format is felt to be most appropriate for this patient at this time.  The patient did not have access to video technology/had technical difficulties with video requiring transitioning to audio format only (telephone).  All issues noted in this document were discussed and addressed.  No physical exam could be performed with this format.  Patient verbally consented to a telehealth visit.   Date:  06/10/2020   ID:  Amy Mccann, DOB [birth year], MRN [id number]  {Patient [phone]::"Home"} {Provider Location:[phone]::"Home Office"}  PCP:  Rochel Brome, MD   Evaluation Performed:  {Choose Visit DTOI:[id number]::"PJASNK-NL Visit"}  Chief Complaint:  ***  History of Present Illness:    Amy Mccann is a 80 y.o. female with ***  The patient {does/does not:200015} have symptoms concerning for COVID-19 infection (fever, chills, cough, or new shortness of breath).    Past Medical History:  Diagnosis Date  . Age-related osteoporosis without current pathological fracture 07/03/2019  . Cancer [facility]) 1990   Left Breast Cancer   . Cancer [facility]) 2017   Thyroid Cancer  . Cervicalgia 07/03/2019  . Diabetes mellitus without complication ([place])   . Generalized anxiety disorder   . GERD (gastroesophageal reflux disease)   . Hypertension   . Low back pain with sciatica 07/03/2019  . Obesity due to excess calories 07/03/2019  . S/P repair of paraesophageal hernia 10/20/2013  . S/P TKR (total knee replacement) 07/25/2012    Past Surgical History:  Procedure Laterality Date  . [place] STUDY N/A 08/07/2013    Procedure: [place] STUDY;  Surgeon: Missy Sabins, MD;  [facility];  Service: Endoscopy;  Laterality: N/A;  . APPENDECTOMY  03/17/2016  . ESOPHAGEAL MANOMETRY N/A 08/07/2013   Procedure: ESOPHAGEAL MANOMETRY (EM);  Surgeon: Missy Sabins, MD;  [facility];  Service: Endoscopy;  Laterality: N/A;  . JOINT REPLACEMENT    . MASTECTOMY Left 1990    No family history on file.  Social History   Socioeconomic History  . Marital status: Divorced    Spouse name: Not on file  . Number of children: Not on file  . Years of education: Not on file  . Highest education level: Not on file  Occupational History  . Not on file  Tobacco Use  . Smoking status: Never Smoker  . Smokeless tobacco: Never Used  Vaping Use  . Vaping Use: Never used  Substance and Sexual Activity  . Alcohol use: Never  . Drug use: Never  . Sexual activity: Not on file  Other Topics Concern  . Not on file  Social History Narrative  . Not on file   Social Determinants of Health   Financial Resource Strain: Not on file  Food Insecurity: Not on file  Transportation Needs: Not on file  Physical Activity: Not on file  Stress: Not on file  Social Connections: Not on file  Intimate Partner Violence: Not on file    Outpatient Medications Prior to Visit  Medication Sig Dispense Refill  . ascorbic acid (VITAMIN C) 500 MG tablet Take by mouth.    . esomeprazole (NEXIUM) 40 MG capsule Take 40 mg  by mouth daily.    . Ginger, Zingiber officinalis, (GINGER ROOT PO) Take by mouth.    Marland Kitchen glucose blood (ACCU-CHEK AVIVA PLUS) test strip Once daily fasting 100 strip 3  . ibuprofen (ADVIL) 600 MG tablet     . levothyroxine (SYNTHROID) 112 MCG tablet TAKE 1 TABLET(112 MCG) BY MOUTH DAILY BEFORE BREAKFAST 90 tablet 1  . losartan (COZAAR) 25 MG tablet TAKE 1 TABLET BY MOUTH DAILY 90 tablet 1  . polyethylene glycol powder (GLYCOLAX/MIRALAX) 17 GM/SCOOP powder Take by mouth.    . psyllium (METAMUCIL) 58.6 %  packet Take by mouth.    . rosuvastatin (CRESTOR) 5 MG tablet TAKE 1 TABLET BY MOUTH DAILY 90 tablet 1  . vitamin E 180 MG (400 UNITS) capsule Take by mouth.     No facility-administered medications prior to visit.    Allergies:   Codeine and Penicillins   Social History   Tobacco Use  . Smoking status: Never Smoker  . Smokeless tobacco: Never Used  Vaping Use  . Vaping Use: Never used  Substance Use Topics  . Alcohol use: Never  . Drug use: Never     ROS   Labs/Other Tests and Data Reviewed:    Recent Labs: 03/05/2020: ALT 19; BUN 12; Creatinine, Ser 0.90; Hemoglobin 13.4; Platelets 286; Potassium 4.4; Sodium 135   Recent Lipid Panel Lab Results  Component Value Date/Time   CHOL 166 03/05/2020 10:08 AM   TRIG 148 03/05/2020 10:08 AM   HDL 61 03/05/2020 10:08 AM   CHOLHDL 2.7 03/05/2020 10:08 AM   LDLCALC 80 03/05/2020 10:08 AM    Wt Readings from Last 3 Encounters:  03/05/20 161 lb (73 kg)  11/03/19 159 lb (72.1 kg)  08/21/19 168 lb (76.2 kg)     Objective:    Vital Signs:  There were no vitals taken for this visit.   Physical Exam   ASSESSMENT & PLAN:   There are no diagnoses linked to this encounter.   No orders of the defined types were placed in this encounter.    No orders of the defined types were placed in this encounter.   COVID-19 Education: The signs and symptoms of COVID-19 were discussed with the patient and how to seek care for testing (follow up with PCP or arrange E-visit). The importance of social distancing was discussed today.   I spent < time > minutes dedicated to the care of this patient on the date of this encounter to include face-to-face time with the patient, as well as: ***  Follow Up:  {F/U Format:[phone]} {follow up:15908}  Signed,  Rip Harbour, NP  06/10/2020 2:49 PM    [place]

## 2020-06-11 ENCOUNTER — Other Ambulatory Visit: Payer: Self-pay

## 2020-06-11 ENCOUNTER — Ambulatory Visit (INDEPENDENT_AMBULATORY_CARE_PROVIDER_SITE_OTHER): Payer: Medicare HMO | Admitting: Nurse Practitioner

## 2020-06-11 ENCOUNTER — Encounter: Payer: Self-pay | Admitting: Nurse Practitioner

## 2020-06-11 VITALS — BP 128/58 | HR 77 | Temp 97.6°F | Ht 60.0 in | Wt 164.0 lb

## 2020-06-11 DIAGNOSIS — R112 Nausea with vomiting, unspecified: Secondary | ICD-10-CM | POA: Diagnosis not present

## 2020-06-11 DIAGNOSIS — R197 Diarrhea, unspecified: Secondary | ICD-10-CM

## 2020-06-11 DIAGNOSIS — R1013 Epigastric pain: Secondary | ICD-10-CM | POA: Diagnosis not present

## 2020-06-11 DIAGNOSIS — Z853 Personal history of malignant neoplasm of breast: Secondary | ICD-10-CM

## 2020-06-11 DIAGNOSIS — Z1239 Encounter for other screening for malignant neoplasm of breast: Secondary | ICD-10-CM

## 2020-06-11 LAB — POCT URINALYSIS DIP (CLINITEK)
Bilirubin, UA: NEGATIVE
Blood, UA: NEGATIVE
Glucose, UA: NEGATIVE mg/dL
Ketones, POC UA: NEGATIVE mg/dL
Leukocytes, UA: NEGATIVE
Nitrite, UA: NEGATIVE
POC PROTEIN,UA: NEGATIVE
Spec Grav, UA: >=1.03 — AB (ref 1.010–1.025)
Urobilinogen, UA: NEGATIVE E.U./dL — AB
pH, UA: 6 (ref 5.0–8.0)

## 2020-06-11 NOTE — Progress Notes (Signed)
Acute Office Visit  Subjective:    Patient ID: Amy Mccann, female    DOB: 05-22-1940, 80 y.o.   MRN: [id number]  CC: Nausea and vomiting   HPI Amy Mccann is a 80 year old Caucasian female that presents with nausea and vomiting. She has experienced mild generalized abdominal discomfort, bloating and belching. Denies fever.Onset was approximately 4-weeks ago. Treatment has included Pepto-Bismol OTC, Nexium, and Tums. She states she has been consuming a light diet since onset of symptoms. She tells me she has a past medical history of diverticulitis and GERD. She has undergone appendectomy and abdominal hernia repair in the past. She tells me she is due for a colonoscopy with Dr Lyda Jester. She has past history of left breast cancer with mastectomy. She has requested right mammogram today.   Past Medical History:  Diagnosis Date  . Age-related osteoporosis without current pathological fracture 07/03/2019  . Cancer [facility]) 1990   Left Breast Cancer   . Cancer [facility]) 2017   Thyroid Cancer  . Cervicalgia 07/03/2019  . Diabetes mellitus without complication ([place])   . Generalized anxiety disorder   . GERD (gastroesophageal reflux disease)   . Hypertension   . Low back pain with sciatica 07/03/2019  . Obesity due to excess calories 07/03/2019  . S/P repair of paraesophageal hernia 10/20/2013  . S/P TKR (total knee replacement) 07/25/2012    Past Surgical History:  Procedure Laterality Date  . [place] STUDY N/A 08/07/2013   Procedure: [place] STUDY;  Surgeon: Missy Sabins, MD;  Location: [facility] ENDOSCOPY;  Service: Endoscopy;  Laterality: N/A;  . APPENDECTOMY  03/17/2016  . ESOPHAGEAL MANOMETRY N/A 08/07/2013   Procedure: ESOPHAGEAL MANOMETRY (EM);  Surgeon: Missy Sabins, MD;  Location: [facility] ENDOSCOPY;  Service: Endoscopy;  Laterality: N/A;  . JOINT REPLACEMENT    . MASTECTOMY Left 1990    No family history on file.  Social History   Socioeconomic History  . Marital status: Divorced     Spouse name: Not on file  . Number of children: Not on file  . Years of education: Not on file  . Highest education level: Not on file  Occupational History  . Not on file  Tobacco Use  . Smoking status: Never Smoker  . Smokeless tobacco: Never Used  Vaping Use  . Vaping Use: Never used  Substance and Sexual Activity  . Alcohol use: Never  . Drug use: Never  . Sexual activity: Not on file  Other Topics Concern  . Not on file  Social History Narrative  . Not on file   Social Determinants of Health   Financial Resource Strain: Not on file  Food Insecurity: Not on file  Transportation Needs: Not on file  Physical Activity: Not on file  Stress: Not on file  Social Connections: Not on file  Intimate Partner Violence: Not on file    Outpatient Medications Prior to Visit  Medication Sig Dispense Refill  . ascorbic acid (VITAMIN C) 500 MG tablet Take by mouth.    . esomeprazole (NEXIUM) 40 MG capsule Take 40 mg by mouth daily.    . Ginger, Zingiber officinalis, (GINGER ROOT PO) Take by mouth.    Marland Kitchen glucose blood (ACCU-CHEK AVIVA PLUS) test strip Once daily fasting 100 strip 3  . ibuprofen (ADVIL) 600 MG tablet     . levothyroxine (SYNTHROID) 112 MCG tablet TAKE 1 TABLET(112 MCG) BY MOUTH DAILY BEFORE BREAKFAST 90 tablet 1  . losartan (COZAAR) 25 MG tablet TAKE  1 TABLET BY MOUTH DAILY 90 tablet 1  . polyethylene glycol powder (GLYCOLAX/MIRALAX) 17 GM/SCOOP powder Take by mouth.    . psyllium (METAMUCIL) 58.6 % packet Take by mouth.    . rosuvastatin (CRESTOR) 5 MG tablet TAKE 1 TABLET BY MOUTH DAILY 90 tablet 1  . vitamin E 180 MG (400 UNITS) capsule Take by mouth.     No facility-administered medications prior to visit.    Allergies  Allergen Reactions  . Codeine Itching and Rash  . Penicillins Rash    Review of Systems  Constitutional: Negative for appetite change, fatigue and unexpected weight change.  HENT: Negative for congestion, ear pain, rhinorrhea, sinus  pressure, sinus pain and tinnitus.   Eyes: Negative for pain.  Respiratory: Negative for cough and shortness of breath.   Cardiovascular: Negative for chest pain, palpitations and leg swelling.  Gastrointestinal: Positive for abdominal distention ("bloating and belching"), abdominal pain, nausea and vomiting. Negative for constipation and diarrhea.  Endocrine: Negative for cold intolerance, heat intolerance, polydipsia, polyphagia and polyuria.  Genitourinary: Negative for dysuria, frequency and hematuria.  Musculoskeletal: Positive for gait problem (ambulates with cane). Negative for arthralgias, back pain, joint swelling and myalgias.  Skin: Negative for rash.  Allergic/Immunologic: Negative for environmental allergies.  Neurological: Negative for dizziness and headaches.  Hematological: Negative for adenopathy.  Psychiatric/Behavioral: Negative for decreased concentration and sleep disturbance. The patient is not nervous/anxious.        Objective:    Physical Exam Vitals reviewed.  Constitutional:      Appearance: Normal appearance.  HENT:     Head: Normocephalic.     Right Ear: Tympanic membrane normal.     Left Ear: Tympanic membrane normal.     Nose: Nose normal.     Mouth/Throat:     Mouth: Mucous membranes are moist.  Eyes:     Pupils: Pupils are equal, round, and reactive to light.  Cardiovascular:     Rate and Rhythm: Normal rate and regular rhythm.     Pulses: Normal pulses.     Heart sounds: Normal heart sounds.  Pulmonary:     Effort: Pulmonary effort is normal.     Breath sounds: Normal breath sounds.  Abdominal:     General: Bowel sounds are normal.     Palpations: Abdomen is soft.  Musculoskeletal:        General: Normal range of motion.     Cervical back: Neck supple.  Skin:    General: Skin is warm and dry.     Capillary Refill: Capillary refill takes less than 2 seconds.  Neurological:     General: No focal deficit present.     Mental Status: She is  alert and oriented to person, place, and time.  Psychiatric:        Mood and Affect: Mood normal.        Behavior: Behavior normal.     BP (!) 128/58 (BP Location: Left Arm, Patient Position: Sitting)   Pulse 77   Temp 97.6 F (36.4 C) (Temporal)   Ht 5' (1.524 m)   Wt 164 lb (74.4 kg)   SpO2 96%   BMI 32.03 kg/m  Wt Readings from Last 3 Encounters:  06/10/20 161 lb (73 kg)  03/05/20 161 lb (73 kg)  11/03/19 159 lb (72.1 kg)    Health Maintenance Due  Topic Date Due  . Hepatitis C Screening  Never done  . MAMMOGRAM  08/05/2019  . COVID-19 Vaccine (4 - Booster for [employer]  series) 06/08/2020   Lab Results  Component Value Date   WBC 8.3 03/05/2020   HGB 13.4 03/05/2020   HCT 39.2 03/05/2020   MCV 95 03/05/2020   PLT 286 03/05/2020   Lab Results  Component Value Date   NA 135 03/05/2020   K 4.4 03/05/2020   CO2 18 (L) 03/05/2020   GLUCOSE 117 (H) 03/05/2020   BUN 12 03/05/2020   CREATININE 0.90 03/05/2020   BILITOT 0.4 03/05/2020   ALKPHOS 100 03/05/2020   AST 22 03/05/2020   ALT 19 03/05/2020   PROT 6.9 03/05/2020   ALBUMIN 4.4 03/05/2020   CALCIUM 9.8 03/05/2020   Lab Results  Component Value Date   CHOL 166 03/05/2020   Lab Results  Component Value Date   HDL 61 03/05/2020   Lab Results  Component Value Date   LDLCALC 80 03/05/2020   Lab Results  Component Value Date   TRIG 148 03/05/2020   Lab Results  Component Value Date   CHOLHDL 2.7 03/05/2020   Lab Results  Component Value Date   HGBA1C 6.4 (H) 03/05/2020       Assessment & Plan:   1. Nausea and vomiting, intractability of vomiting not specified, unspecified vomiting type - CBC with Differential/Platelet - Comprehensive metabolic panel - H Pylori, IGM, IGG, IGA AB - Amylase - Lipase - POCT URINALYSIS DIP (CLINITEK)  2. Epigastric discomfort - CBC with Differential/Platelet - Comprehensive metabolic panel - H Pylori, IGM, IGG, IGA AB - Amylase - Lipase - POCT URINALYSIS  DIP (CLINITEK)  3. Diarrhea, unspecified type - POCT URINALYSIS DIP (CLINITEK)  4. Encounter for other screening for malignant neoplasm of breast - MM DIGITAL SCREENING BILATERAL  5. History of breast cancer - MM DIGITAL SCREENING BILATERAL   Rest and push fluids Low fat, bland diet Continue medications for nausea and vomiting We will call you with lab results and mammogram appointment We may refer you to Dr Lyda Jester based on lab results  Follow-up as needed   Follow-up: Pending lab results   Rip Harbour, NP

## 2020-06-11 NOTE — Patient Instructions (Addendum)
Rest and push fluids Low fat, bland diet Continue medications for nausea and vomiting We will call you with lab results and mammogram appointment We may refer you to Dr Lyda Jester based on lab results  Follow-up as needed  Amy Mccann Diet A bland diet consists of foods that are often soft and do not have a lot of fat, fiber, or extra seasonings. Foods without fat, fiber, or seasoning are easier for the body to digest. They are also less likely to irritate your mouth, throat, stomach, and other parts of your digestive system. A bland diet is sometimes called a BRAT diet. What is my plan? Your health care provider or food and nutrition specialist (dietitian) may recommend specific changes to your diet to prevent symptoms or to treat your symptoms. These changes may include:  Eating small meals often.  Cooking food until it is soft enough to chew easily.  Chewing your food well.  Drinking fluids slowly.  Not eating foods that are very spicy, sour, or fatty.  Not eating citrus fruits, such as oranges and grapefruit. What do I need to know about this diet?  Eat a variety of foods from the bland diet food list.  Do not follow a bland diet longer than needed.  Ask your health care provider whether you should take vitamins or supplements. What foods can I eat? Grains Hot cereals, such as cream of wheat. Rice. Bread, crackers, or tortillas made from refined white flour.   Vegetables Canned or cooked vegetables. Mashed or boiled potatoes. Fruits Bananas. Applesauce. Other types of cooked or canned fruit with the skin and seeds removed, such as canned peaches or pears.   Meats and other proteins Scrambled eggs. Creamy peanut butter or other nut butters. Lean, well-cooked meats, such as chicken or fish. Tofu. Soups or broths.   Dairy Low-fat dairy products, such as milk, cottage cheese, or yogurt. Beverages Water. Herbal tea. Apple juice.   Fats and oils Mild salad dressings. Canola or  olive oil. Sweets and desserts Pudding. Custard. Fruit gelatin. Ice cream. The items listed above may not be a complete list of recommended foods and beverages. Contact a dietitian for more options. What foods are not recommended? Grains Whole grain breads and cereals. Vegetables Raw vegetables. Fruits Raw fruits, especially citrus, berries, or dried fruits. Dairy Whole fat dairy foods. Beverages Caffeinated drinks. Alcohol. Seasonings and condiments Strongly flavored seasonings or condiments. Hot sauce. Salsa. Other foods Spicy foods. Fried foods. Sour foods, such as pickled or fermented foods. Foods with high sugar content. Foods high in fiber. The items listed above may not be a complete list of foods and beverages to avoid. Contact a dietitian for more information. Summary  A bland diet consists of foods that are often soft and do not have a lot of fat, fiber, or extra seasonings.  Foods without fat, fiber, or seasoning are easier for the body to digest.  Check with your health care provider to see how long you should follow this diet plan. It is not meant to be followed for long periods. This information is not intended to replace advice given to you by your health care provider. Make sure you discuss any questions you have with your health care provider. Document Revised: 02/24/2017 Document Reviewed: 02/24/2017 Elsevier Patient Education  2021 [place] If you have a gallbladder condition, you may have trouble digesting fats. Eating a low-fat diet can help reduce your symptoms, and may be helpful before and after having  surgery to remove your gallbladder (cholecystectomy). Your health care provider may recommend that you work with a diet and nutrition specialist (dietitian) to help you reduce the amount of fat in your diet. What are tips for following this plan? General guidelines  Limit your fat intake to less than 30% of your total daily  calories. If you eat around 1,800 calories each day, this is less than 60 grams (g) of fat per day.  Fat is an important part of a healthy diet. Eating a low-fat diet can make it hard to maintain a healthy body weight. Ask your dietitian how much fat, calories, and other nutrients you need each day.  Eat small, frequent meals throughout the day instead of three large meals.  Drink at least 8-10 cups of fluid a day. Drink enough fluid to keep your urine clear or pale yellow.  Limit alcohol intake to no more than 1 drink a day for nonpregnant women and 2 drinks a day for men. One drink equals 12 oz of beer, 5 oz of wine, or 1 oz of hard liquor. Reading food labels  Check Nutrition Facts on food labels for the amount of fat per serving. Choose foods with less than 3 grams of fat per serving.   Shopping  Choose nonfat and low-fat healthy foods. Look for the words "nonfat," "low fat," or "fat free."  Avoid buying processed or prepackaged foods. Cooking  Cook using low-fat methods, such as baking, broiling, grilling, or boiling.  Cook with small amounts of healthy fats, such as olive oil, grapeseed oil, canola oil, or sunflower oil. What foods are recommended?  All fresh, frozen, or canned fruits and vegetables.  Whole grains.  Low-fat or non-fat (skim) milk and yogurt.  Lean meat, skinless poultry, fish, eggs, and beans.  Low-fat protein supplement powders or drinks.  Spices and herbs. What foods are not recommended?  High-fat foods. These include baked goods, fast food, fatty cuts of meat, ice cream, french toast, sweet rolls, pizza, cheese bread, foods covered with butter, creamy sauces, or cheese.  Fried foods. These include french fries, tempura, battered fish, breaded chicken, fried breads, and sweets.  Foods with strong odors.  Foods that cause bloating and gas. Summary  A low-fat diet can be helpful if you have a gallbladder condition, or before and after gallbladder  surgery.  Limit your fat intake to less than 30% of your total daily calories. This is about 60 g of fat if you eat 1,800 calories each day.  Eat small, frequent meals throughout the day instead of three large meals. This information is not intended to replace advice given to you by your health care provider. Make sure you discuss any questions you have with your health care provider. Document Revised: 09/14/2019 Document Reviewed: 09/14/2019 Elsevier Patient Education  2021 [employer].

## 2020-06-12 LAB — COMPREHENSIVE METABOLIC PANEL
ALT: 17 IU/L (ref 0–32)
AST: 19 IU/L (ref 0–40)
Albumin/Globulin Ratio: 1.6 (ref 1.2–2.2)
Albumin: 4.1 g/dL (ref 3.7–4.7)
Alkaline Phosphatase: 100 IU/L (ref 44–121)
BUN/Creatinine Ratio: 13 (ref 12–28)
BUN: 14 mg/dL (ref 8–27)
Bilirubin Total: 0.3 mg/dL (ref 0.0–1.2)
CO2: 20 mmol/L (ref 20–29)
Calcium: 9 mg/dL (ref 8.7–10.3)
Chloride: 99 mmol/L (ref 96–106)
Creatinine, Ser: 1.1 mg/dL — ABNORMAL HIGH (ref 0.57–1.00)
Globulin, Total: 2.5 g/dL (ref 1.5–4.5)
Glucose: 128 mg/dL — ABNORMAL HIGH (ref 65–99)
Potassium: 4 mmol/L (ref 3.5–5.2)
Sodium: 136 mmol/L (ref 134–144)
Total Protein: 6.6 g/dL (ref 6.0–8.5)
eGFR: 51 mL/min/{1.73_m2} — ABNORMAL LOW (ref >59)

## 2020-06-12 LAB — CBC WITH DIFFERENTIAL/PLATELET
Basophils Absolute: 0 10*3/uL (ref 0.0–0.2)
Basos: 0 %
EOS (ABSOLUTE): 0.1 10*3/uL (ref 0.0–0.4)
Eos: 1 %
Hematocrit: 36.8 % (ref 34.0–46.6)
Hemoglobin: 12.5 g/dL (ref 11.1–15.9)
Immature Grans (Abs): 0 10*3/uL (ref 0.0–0.1)
Immature Granulocytes: 0 %
Lymphocytes Absolute: 1.7 10*3/uL (ref 0.7–3.1)
Lymphs: 21 %
MCH: 32.9 pg (ref 26.6–33.0)
MCHC: 34 g/dL (ref 31.5–35.7)
MCV: 97 fL (ref 79–97)
Monocytes Absolute: 0.5 10*3/uL (ref 0.1–0.9)
Monocytes: 6 %
Neutrophils Absolute: 5.6 10*3/uL (ref 1.4–7.0)
Neutrophils: 72 %
Platelets: 274 10*3/uL (ref 150–450)
RBC: 3.8 x10E6/uL (ref 3.77–5.28)
RDW: 12.6 % (ref 11.7–15.4)
WBC: 7.8 10*3/uL (ref 3.4–10.8)

## 2020-06-12 LAB — AMYLASE: Amylase: 50 U/L (ref 31–110)

## 2020-06-12 LAB — H PYLORI, IGM, IGG, IGA AB
H pylori, IgM Abs: <9 units (ref 0.0–8.9)
H. pylori, IgA Abs: <9 units (ref 0.0–8.9)
H. pylori, IgG AbS: 0.14 Index Value (ref 0.00–0.79)

## 2020-06-12 LAB — LIPASE: Lipase: 44 U/L (ref 14–85)

## 2020-06-13 ENCOUNTER — Other Ambulatory Visit: Payer: Self-pay

## 2020-06-13 DIAGNOSIS — R1013 Epigastric pain: Secondary | ICD-10-CM

## 2020-06-13 DIAGNOSIS — R112 Nausea with vomiting, unspecified: Secondary | ICD-10-CM

## 2020-06-19 DIAGNOSIS — Z853 Personal history of malignant neoplasm of breast: Secondary | ICD-10-CM | POA: Diagnosis not present

## 2020-06-19 DIAGNOSIS — C50912 Malignant neoplasm of unspecified site of left female breast: Secondary | ICD-10-CM | POA: Diagnosis not present

## 2020-06-20 DIAGNOSIS — B354 Tinea corporis: Secondary | ICD-10-CM | POA: Diagnosis not present

## 2020-06-20 DIAGNOSIS — L304 Erythema intertrigo: Secondary | ICD-10-CM | POA: Diagnosis not present

## 2020-06-20 DIAGNOSIS — K130 Diseases of lips: Secondary | ICD-10-CM | POA: Diagnosis not present

## 2020-06-27 ENCOUNTER — Other Ambulatory Visit: Payer: Self-pay | Admitting: Nurse Practitioner

## 2020-06-27 ENCOUNTER — Telehealth: Payer: Self-pay

## 2020-06-27 DIAGNOSIS — R110 Nausea: Secondary | ICD-10-CM

## 2020-06-27 MED ORDER — ONDANSETRON 8 MG PO TBDP
4.0000 mg | ORAL_TABLET | Freq: Three times a day (TID) | ORAL | 1 refills | Status: DC | PRN
Start: 2020-06-27 — End: 2021-09-15

## 2020-06-27 NOTE — Telephone Encounter (Signed)
Amy Mccann called with continued complaints of nausea.  She has been taking zofran 4 mg prn with some relief.  She has talked with Dr. Crisoforo [place] office and she is scheduled for June 9.  They have placed her on a call list if they have availability before.  She is complaining of some abdominal discomfort but mild.  She has been taking miralax for constipation.  She was instructed to follow-up in the office if she develops fever, chills or increasing abdominal pain. Jerrell Belfast, NP will send a refill on her zofran.

## 2020-07-01 ENCOUNTER — Ambulatory Visit: Admission: RE | Admit: 2020-07-01 | Payer: Medicare HMO | Source: Ambulatory Visit

## 2020-07-14 ENCOUNTER — Other Ambulatory Visit: Payer: Self-pay | Admitting: Family Medicine

## 2020-07-15 ENCOUNTER — Telehealth: Payer: Self-pay

## 2020-07-15 MED ORDER — ACCU-CHEK SOFTCLIX LANCETS MISC: 5 refills | Status: DC

## 2020-07-15 NOTE — Telephone Encounter (Signed)
[facility] calling requesting refill of lancets for pt.   Amy Mccann, Amy Mccann 07/15/20 11:58 AM

## 2020-07-18 DIAGNOSIS — K219 Gastro-esophageal reflux disease without esophagitis: Secondary | ICD-10-CM | POA: Diagnosis not present

## 2020-07-18 DIAGNOSIS — R1013 Epigastric pain: Secondary | ICD-10-CM | POA: Diagnosis not present

## 2020-07-18 DIAGNOSIS — R110 Nausea: Secondary | ICD-10-CM | POA: Diagnosis not present

## 2020-07-25 DIAGNOSIS — L820 Inflamed seborrheic keratosis: Secondary | ICD-10-CM | POA: Diagnosis not present

## 2020-07-25 DIAGNOSIS — L304 Erythema intertrigo: Secondary | ICD-10-CM | POA: Diagnosis not present

## 2020-07-25 DIAGNOSIS — L821 Other seborrheic keratosis: Secondary | ICD-10-CM | POA: Diagnosis not present

## 2020-08-15 ENCOUNTER — Encounter: Payer: Self-pay | Admitting: Podiatry

## 2020-08-15 ENCOUNTER — Ambulatory Visit (INDEPENDENT_AMBULATORY_CARE_PROVIDER_SITE_OTHER): Payer: Medicare HMO | Admitting: Podiatry

## 2020-08-15 ENCOUNTER — Other Ambulatory Visit: Payer: Self-pay

## 2020-08-15 DIAGNOSIS — L84 Corns and callosities: Secondary | ICD-10-CM

## 2020-08-15 DIAGNOSIS — M21611 Bunion of right foot: Secondary | ICD-10-CM

## 2020-08-15 DIAGNOSIS — M2042 Other hammer toe(s) (acquired), left foot: Secondary | ICD-10-CM

## 2020-08-15 DIAGNOSIS — E1151 Type 2 diabetes mellitus with diabetic peripheral angiopathy without gangrene: Secondary | ICD-10-CM

## 2020-08-15 DIAGNOSIS — M2011 Hallux valgus (acquired), right foot: Secondary | ICD-10-CM

## 2020-08-15 DIAGNOSIS — M2041 Other hammer toe(s) (acquired), right foot: Secondary | ICD-10-CM | POA: Diagnosis not present

## 2020-08-15 NOTE — Progress Notes (Signed)
  Subjective:  Patient ID: Amy Mccann, female    DOB: 1940/04/24,  MRN: [id number]  Chief Complaint  Patient presents with   Callouses    I have a few corns and calluses that need to be looked at     80 y.o. female presents with the above complaint. History confirmed with patient. Also interested in DM shoes. States that the right 3rd/4th toes are the only toes that bother her  Objective:  Physical Exam: warm, good capillary refill, nail exam normal nails without lesions, no trophic changes or ulcerative lesions. DP pulses palpable and protective sensation intact. HAV deformity bilat. Non-palpable PT pulses. DP pulses palpable. Hammertoes, rigid bilat. HPKs submet 5 bilat, right 4th toe medial PIPJ, 5th Toe dorsal DIPJ bilat.  No images are attached to the encounter.  Assessment:   1. Diabetes mellitus type 2 with peripheral artery disease (HCC)   2. Callus   3. Hallux valgus with bunions of right foot   4. Acquired hammertoes of both feet    Plan:  Patient was evaluated and treated and all questions answered.  Bunion, Hammertoe, Diabetes, and PAD -Patient is diabetic with a qualifying condition for at risk foot care. -Would benefit from DM shoes. Will make appt for fabrication -Educated on DM Footcare. Educated on self care of her calluses including epsom salt soaks. -Dispense toe cushions.  Procedure: Paring of Lesion Rationale: painful hyperkeratotic lesion Type of Debridement: manual, sharp debridement. Instrumentation: 31 2blade Number of Lesions: 4  Return if symptoms worsen or fail to improve.

## 2020-08-19 ENCOUNTER — Other Ambulatory Visit: Payer: Medicare HMO

## 2020-08-27 ENCOUNTER — Other Ambulatory Visit: Payer: Medicare HMO

## 2020-08-28 ENCOUNTER — Other Ambulatory Visit: Payer: Self-pay | Admitting: Family Medicine

## 2020-08-28 DIAGNOSIS — Z1231 Encounter for screening mammogram for malignant neoplasm of breast: Secondary | ICD-10-CM | POA: Diagnosis not present

## 2020-09-02 ENCOUNTER — Encounter: Payer: Self-pay | Admitting: Nurse Practitioner

## 2020-09-02 ENCOUNTER — Ambulatory Visit: Payer: Medicare HMO | Admitting: Family Medicine

## 2020-09-02 ENCOUNTER — Ambulatory Visit (INDEPENDENT_AMBULATORY_CARE_PROVIDER_SITE_OTHER): Payer: Medicare HMO | Admitting: Nurse Practitioner

## 2020-09-02 ENCOUNTER — Other Ambulatory Visit: Payer: Self-pay

## 2020-09-02 VITALS — BP 130/70 | HR 80 | Temp 97.4°F | Ht 60.0 in | Wt 163.0 lb

## 2020-09-02 DIAGNOSIS — E782 Mixed hyperlipidemia: Secondary | ICD-10-CM | POA: Diagnosis not present

## 2020-09-02 DIAGNOSIS — I10 Essential (primary) hypertension: Secondary | ICD-10-CM

## 2020-09-02 DIAGNOSIS — E890 Postprocedural hypothyroidism: Secondary | ICD-10-CM

## 2020-09-02 DIAGNOSIS — M8580 Other specified disorders of bone density and structure, unspecified site: Secondary | ICD-10-CM

## 2020-09-02 DIAGNOSIS — M810 Age-related osteoporosis without current pathological fracture: Secondary | ICD-10-CM | POA: Diagnosis not present

## 2020-09-02 DIAGNOSIS — K130 Diseases of lips: Secondary | ICD-10-CM

## 2020-09-02 DIAGNOSIS — Z780 Asymptomatic menopausal state: Secondary | ICD-10-CM | POA: Diagnosis not present

## 2020-09-02 DIAGNOSIS — E1142 Type 2 diabetes mellitus with diabetic polyneuropathy: Secondary | ICD-10-CM

## 2020-09-02 MED ORDER — CLOTRIMAZOLE 1 % EX OINT: 1.0000 "application " | TOPICAL_OINTMENT | Freq: Two times a day (BID) | CUTANEOUS | 1 refills | Status: DC

## 2020-09-02 NOTE — Progress Notes (Signed)
Subjective:  Patient ID: Amy Mccann, female    DOB: 02-Jun-1940  Age: 80 y.o. MRN: [id number]  Chief Complaint  Patient presents with   Hypertension   Hyperlipidemia   Hypothyroidism   HPI Amy Mccann is a 80 year old Caucasian female that presents for follow-up of hypertension, hyperlipidemia, and hypothyroidism. She denies any recent falls. Ambulates with a cane. She tells me that she is wearing her back brace today for chronic back pain. She has been performing back exercises 3 times daily to strengthen back muscles. She is concerned about chapping to both sides of her mouth.    Hypertension,follow-up  She was last seen for hypertension 6 months ago.  BP at that visit was 124/64. Management since that visit includes Losartan 25 mg.  She reports excellent compliance with treatment. She is not having side effects.  She is following a Low Sodium diet. She is exercising. She does not smoke.  Use of agents associated with hypertension: thyroid hormones.   Outside blood pressures are 120s/70s. Symptoms: No chest pain No chest pressure  No palpitations No syncope  No dyspnea No orthopnea  No paroxysmal nocturnal dyspnea No lower extremity edema   Pertinent labs: Lab Results  Component Value Date   CHOL 166 03/05/2020   HDL 61 03/05/2020   LDLCALC 80 03/05/2020   TRIG 148 03/05/2020   CHOLHDL 2.7 03/05/2020   Lab Results  Component Value Date   NA 136 06/11/2020   K 4.0 06/11/2020   CREATININE 1.10 (H) 06/11/2020   GFRNONAA 61 03/05/2020   GFRAA 70 03/05/2020   GLUCOSE 128 (H) 06/11/2020     The 10-year ASCVD risk score Mikey Bussing DC Jr., et al., 2013) is: 53.6%     Lipid/Cholesterol, Follow-up  Last lipid panel Other pertinent labs  Lab Results  Component Value Date   CHOL 166 03/05/2020   HDL 61 03/05/2020   LDLCALC 80 03/05/2020   TRIG 148 03/05/2020   CHOLHDL 2.7 03/05/2020   Lab Results  Component Value Date   ALT 17 06/11/2020   AST 19 06/11/2020   PLT  274 06/11/2020     She was last seen for this 6 months ago.  Management since that visit includes Crestor 5 mg daily.  She reports excellent compliance with treatment. She is not having side effects.   Symptoms: No chest pain No chest pressure/discomfort  No dyspnea No lower extremity edema  No numbness or tingling of extremity No orthopnea  No palpitations No paroxysmal nocturnal dyspnea  No speech difficulty No syncope   Current diet: well balanced Current exercise: housecleaning  The 10-year ASCVD risk score Mikey Bussing DC Jr., et al., 2013) is: 53.6%   Hypothyroidism, follow-up Amy Mccann has a past medical history of post-operative hypothyroidism. She underwent thyroidectomy in 08/2015 due to adenocarcinoma.  - Medications:  Levothyroxine 112 mcg daily - Current symptoms:  none - Denies heat / cold intolerance - Symptoms have stabilized  Osteopenia Amy Mccann has a past medical history of osteopenia per DEXA scan on 08/04/20 with T-score -1.1. Not currently taking Calcium or Vitamin D. Previously treated with Fosamax 70 mg weekly, discontinued due to adverse reaction.   Diabetes Mellitus Type II, Follow-up  Lab Results  Component Value Date   HGBA1C 6.4 (H) 03/05/2020   HGBA1C 6.3 (H) 11/03/2019   HGBA1C 6.2 (H) 07/04/2019   Wt Readings from Last 3 Encounters:  09/02/20 163 lb (73.9 kg)  06/11/20 164 lb (74.4 kg)  06/10/20 161 lb (73 kg)  Last seen for diabetes 6 months ago.  Management since then includes diet controlled. She reports good compliance with treatment. She is not having side effects.  Symptoms: No fatigue No foot ulcerations  No appetite changes No nausea  No paresthesia of the feet  No polydipsia  No polyuria No visual disturbances   No vomiting     Home blood sugar records:  not currently checking blood glucose  Episodes of hypoglycemia? No    Current insulin regiment: none Most Recent Eye Exam: 2021 Current exercise: housecleaning Current diet  habits: in general, a "healthy" diet    Pertinent Labs: Lab Results  Component Value Date   CHOL 166 03/05/2020   HDL 61 03/05/2020   LDLCALC 80 03/05/2020   TRIG 148 03/05/2020   CHOLHDL 2.7 03/05/2020   Lab Results  Component Value Date   NA 136 06/11/2020   K 4.0 06/11/2020   CREATININE 1.10 (H) 06/11/2020   GFRNONAA 61 03/05/2020   GFRAA 70 03/05/2020   GLUCOSE 128 (H) 06/11/2020         Current Outpatient Medications on File Prior to Visit  Medication Sig Dispense Refill   Accu-Chek Softclix Lancets lancets Use as instructed 100 each 5   ascorbic acid (VITAMIN C) 500 MG tablet Take by mouth.     esomeprazole (NEXIUM) 40 MG capsule Take 40 mg by mouth daily.     fluconazole (DIFLUCAN) 200 MG tablet      Ginger, Zingiber officinalis, (GINGER ROOT PO) Take by mouth.     glucose blood (ACCU-CHEK AVIVA PLUS) test strip Once daily fasting 100 strip 3   ibuprofen (ADVIL) 600 MG tablet      levothyroxine (SYNTHROID) 112 MCG tablet TAKE 1 TABLET(112 MCG) BY MOUTH DAILY BEFORE BREAKFAST 90 tablet 1   losartan (COZAAR) 25 MG tablet TAKE 1 TABLET BY MOUTH DAILY 90 tablet 1   ondansetron (ZOFRAN ODT) 8 MG disintegrating tablet Take 0.5 tablets (4 mg total) by mouth every 8 (eight) hours as needed for nausea or vomiting. 30 tablet 1   polyethylene glycol powder (GLYCOLAX/MIRALAX) 17 GM/SCOOP powder Take by mouth.     psyllium (METAMUCIL) 58.6 % packet Take by mouth.     rosuvastatin (CRESTOR) 5 MG tablet TAKE 1 TABLET BY MOUTH DAILY 90 tablet 1   triamcinolone cream (KENALOG) 0.1 %      vitamin E 180 MG (400 UNITS) capsule Take by mouth.     No current facility-administered medications on file prior to visit.   Past Medical History:  Diagnosis Date   Age-related osteoporosis without current pathological fracture 07/03/2019   Cancer [facility]) 1990   Left Breast Cancer    Cancer ([place]) 2017   Thyroid Cancer   Cervicalgia 07/03/2019   Diabetes mellitus without complication (HCC)     Generalized anxiety disorder    GERD (gastroesophageal reflux disease)    Hypertension    Low back pain with sciatica 07/03/2019   Obesity due to excess calories 07/03/2019   S/P repair of paraesophageal hernia 10/20/2013   S/P TKR (total knee replacement) 07/25/2012   Past Surgical History:  Procedure Laterality Date   24 HOUR [place] STUDY N/A 08/07/2013   Procedure: 24 HOUR [place] STUDY;  Surgeon: Missy Sabins, MD;  Location: [facility] ENDOSCOPY;  Service: Endoscopy;  Laterality: N/A;   APPENDECTOMY  03/17/2016   ESOPHAGEAL MANOMETRY N/A 08/07/2013   Procedure: ESOPHAGEAL MANOMETRY (EM);  Surgeon: Missy Sabins, MD;  Location: [facility] ENDOSCOPY;  Service: Endoscopy;  Laterality:  N/A;   JOINT REPLACEMENT     MASTECTOMY Left 1990    History reviewed. No pertinent family history. Social History   Socioeconomic History   Marital status: Divorced    Spouse name: Not on file   Number of children: Not on file   Years of education: Not on file   Highest education level: Not on file  Occupational History   Not on file  Tobacco Use   Smoking status: Never   Smokeless tobacco: Never  Vaping Use   Vaping Use: Never used  Substance and Sexual Activity   Alcohol use: Never   Drug use: Never   Sexual activity: Not on file  Other Topics Concern   Not on file  Social History Narrative   Not on file   Social Determinants of Health   Financial Resource Strain: Not on file  Food Insecurity: Not on file  Transportation Needs: Not on file  Physical Activity: Not on file  Stress: Not on file  Social Connections: Not on file    Review of Systems  Constitutional:  Negative for appetite change, fatigue and fever.  HENT:  Positive for mouth sores (chapped corners of mouth). Negative for congestion, ear pain, sinus pressure and sore throat.   Eyes:  Negative for pain.  Respiratory:  Negative for cough, chest tightness, shortness of breath and wheezing.   Cardiovascular:  Negative for chest pain and  palpitations.  Gastrointestinal:  Negative for abdominal pain, constipation, diarrhea, nausea and vomiting.  Genitourinary:  Negative for dysuria and hematuria.  Musculoskeletal:  Positive for arthralgias (chronic to bilateral knees) and back pain (chronic). Negative for joint swelling and myalgias.  Skin:  Negative for rash.  Neurological:  Negative for dizziness, weakness and headaches.  Hematological: Negative.   Psychiatric/Behavioral:  Negative for dysphoric mood. The patient is not nervous/anxious.     Objective:  BP 130/70 (BP Location: Left Arm, Patient Position: Sitting)   Pulse 80   Temp (!) 97.4 F (36.3 C) (Temporal)   Ht 5' (1.524 m)   Wt 163 lb (73.9 kg)   SpO2 96%   BMI 31.83 kg/m   BP/Weight 09/02/2020 [id number]  Systolic BP [id number] 0000000 -  Diastolic BP 70 58 -  Wt. (Lbs) 163 164 161  BMI 31.83 32.03 31.44    Physical Exam Vitals reviewed.  Constitutional:      Appearance: Normal appearance.  HENT:     Right Ear: Tympanic membrane normal.     Left Ear: Tympanic membrane normal.     Nose: Nose normal.     Mouth/Throat:     Mouth: Mucous membranes are moist.      Comments: Angular cheilitis noted to oral commissures  Cardiovascular:     Rate and Rhythm: Normal rate and regular rhythm.     Pulses: Normal pulses.     Heart sounds: Normal heart sounds.  Pulmonary:     Effort: Pulmonary effort is normal.     Breath sounds: Normal breath sounds.  Abdominal:     General: Bowel sounds are normal.     Palpations: Abdomen is soft.  Musculoskeletal:     Cervical back: Neck supple.  Skin:    General: Skin is warm and dry.     Capillary Refill: Capillary refill takes less than 2 seconds.  Neurological:     General: No focal deficit present.     Mental Status: She is alert and oriented to person, place, and time.  Psychiatric:  Mood and Affect: Mood normal.        Behavior: Behavior normal.        Thought Content: Thought content normal.         Judgment: Judgment normal.    Lab Results  Component Value Date   WBC 7.8 06/11/2020   HGB 12.5 06/11/2020   HCT 36.8 06/11/2020   PLT 274 06/11/2020   GLUCOSE 128 (H) 06/11/2020   CHOL 166 03/05/2020   TRIG 148 03/05/2020   HDL 61 03/05/2020   LDLCALC 80 03/05/2020   ALT 17 06/11/2020   AST 19 06/11/2020   NA 136 06/11/2020   K 4.0 06/11/2020   CL 99 06/11/2020   CREATININE 1.10 (H) 06/11/2020   BUN 14 06/11/2020   CO2 20 06/11/2020   INR 0.99 01/21/2010   HGBA1C 6.4 (H) 03/05/2020   MICROALBUR 30 04/05/2019      Assessment & Plan:    1. Essential hypertension, benign-well controlled - CBC With Diff/Platelet - Comprehensive metabolic panel -Continue Losartan 25 mg daily -DASH diet  2. Mixed hyperlipidemia-well controlled - Lipid panel -Continue Crestor 5 mg daily  3. Postoperative hypothyroidism-well controlled - TSH -Continue levothyroxine 112 mcg  4. Type 2 diabetes mellitus with polyneuropathy (HCC) - Hemoglobin A1c - B12 and Folate Panel  5. Angular cheilitis - Clotrimazole 1 % OINT; Apply 1 application topically in the morning and at bedtime.  Dispense: 56.7 g; Refill: 1  6. Osteopenia after menopause - DG Bone Density - Vitamin D, 25-hydroxy  -recommend Vit D 2, 000 IU and Calcium 1,200 mg daily     Continue medications We will call you with lab results Follow-up fasting appointment, 68-month      Follow-up: 433-month An After Visit Summary was printed and given to the patient.  I,Lauren M Auman,acting as a scEducation administratoror ShCIT GroupNP.,have documented all relevant documentation on the behalf of ShRip HarbourNP,as directed by  ShRip HarbourNP while in the presence of ShRip HarbourNP.    I, ShRip HarbourNP, have reviewed all documentation for this visit. The documentation on 09/02/20 for the exam, diagnosis, procedures, and orders are all accurate and complete.   ShRip HarbourNP [phone]

## 2020-09-02 NOTE — Patient Instructions (Addendum)
Continue medications We will call you with lab results Follow-up fasting appointment, 79-month   Diabetes Mellitus and Nutrition, Adult When you have diabetes, or diabetes mellitus, it is very important to have healthy eating habits because your blood sugar (glucose) levels are greatly affected by what you eat and drink. Eating healthy foods in the right amounts, at about the same times every day, can help you: Control your blood glucose. Lower your risk of heart disease. Improve your blood pressure. Reach or maintain a healthy weight. What can affect my meal plan? Every person with diabetes is different, and each person has different needs for a meal plan. Your health care provider may recommend that you work with a dietitian to make a meal plan that is best for you. Your meal plan may vary depending on factors such as: The calories you need. The medicines you take. Your weight. Your blood glucose, blood pressure, and cholesterol levels. Your activity level. Other health conditions you have, such as heart or kidney disease. How do carbohydrates affect me? Carbohydrates, also called carbs, affect your blood glucose level more than any other type of food. Eating carbs naturally raises the amount of glucose in your blood. Carb counting is a method for keeping track of how many carbs you eat. Counting carbs is important to keep your blood glucose at a healthy level,especially if you use insulin or take certain oral diabetes medicines. It is important to know how many carbs you can safely have in each meal. This is different for every person. Your dietitian can help you calculate how manycarbs you should have at each meal and for each snack. How does alcohol affect me? Alcohol can cause a sudden decrease in blood glucose (hypoglycemia), especially if you use insulin or take certain oral diabetes medicines. Hypoglycemia can be a life-threatening condition. Symptoms of hypoglycemia, such as  sleepiness, dizziness, and confusion, are similar to symptoms of having too much alcohol. Do not drink alcohol if: Your health care provider tells you not to drink. You are pregnant, may be pregnant, or are planning to become pregnant. If you drink alcohol: Do not drink on an empty stomach. Limit how much you use to: 0-1 drink a day for women. 0-2 drinks a day for men. Be aware of how much alcohol is in your drink. In the [place] one drink equals one 12 oz bottle of beer (355 mL), one 5 oz glass of wine (148 mL), or one 1 oz glass of hard liquor (44 mL). Keep yourself hydrated with water, diet soda, or unsweetened iced tea. Keep in mind that regular soda, juice, and other mixers may contain a lot of sugar and must be counted as carbs. What are tips for following this plan?  Reading food labels Start by checking the serving size on the "Nutrition Facts" label of packaged foods and drinks. The amount of calories, carbs, fats, and other nutrients listed on the label is based on one serving of the item. Many items contain more than one serving per package. Check the total grams (g) of carbs in one serving. You can calculate the number of servings of carbs in one serving by dividing the total carbs by 15. For example, if a food has 30 g of total carbs per serving, it would be equal to 2 servings of carbs. Check the number of grams (g) of saturated fats and trans fats in one serving. Choose foods that have a low amount or none of these fats. Check the  number of milligrams (mg) of salt (sodium) in one serving. Most people should limit total sodium intake to less than 2,300 mg per day. Always check the nutrition information of foods labeled as "low-fat" or "nonfat." These foods may be higher in added sugar or refined carbs and should be avoided. Talk to your dietitian to identify your daily goals for nutrients listed on the label. Shopping Avoid buying canned, pre-made, or processed foods. These foods  tend to be high in fat, sodium, and added sugar. Shop around the outside edge of the grocery store. This is where you will most often find fresh fruits and vegetables, bulk grains, fresh meats, and fresh dairy. Cooking Use low-heat cooking methods, such as baking, instead of high-heat cooking methods like deep frying. Cook using healthy oils, such as olive, canola, or sunflower oil. Avoid cooking with butter, cream, or high-fat meats. Meal planning Eat meals and snacks regularly, preferably at the same times every day. Avoid going long periods of time without eating. Eat foods that are high in fiber, such as fresh fruits, vegetables, beans, and whole grains. Talk with your dietitian about how many servings of carbs you can eat at each meal. Eat 4-6 oz (112-168 g) of lean protein each day, such as lean meat, chicken, fish, eggs, or tofu. One ounce (oz) of lean protein is equal to: 1 oz (28 g) of meat, chicken, or fish. 1 egg.  cup (62 g) of tofu. Eat some foods each day that contain healthy fats, such as avocado, nuts, seeds, and fish. What foods should I eat? Fruits Berries. Apples. Oranges. Peaches. Apricots. Plums. Grapes. Mango. Papaya.Pomegranate. Kiwi. Cherries. Vegetables Lettuce. Spinach. Leafy greens, including kale, chard, collard greens, and mustard greens. Beets. Cauliflower. Cabbage. Broccoli. Carrots. Green beans.Tomatoes. Peppers. Onions. Cucumbers. Brussels sprouts. Grains Whole grains, such as whole-wheat or whole-grain bread, crackers, tortillas,cereal, and pasta. Unsweetened oatmeal. Quinoa. Brown or wild rice. Meats and other proteins Seafood. Poultry without skin. Lean cuts of poultry and beef. Tofu. Nuts. Seeds. Dairy Low-fat or fat-free dairy products such as milk, yogurt, and cheese. The items listed above may not be a complete list of foods and beverages you can eat. Contact a dietitian for more information. What foods should I avoid? Fruits Fruits canned with  syrup. Vegetables Canned vegetables. Frozen vegetables with butter or cream sauce. Grains Refined white flour and flour products such as bread, pasta, snack foods, andcereals. Avoid all processed foods. Meats and other proteins Fatty cuts of meat. Poultry with skin. Breaded or fried meats. Processed meat.Avoid saturated fats. Dairy Full-fat yogurt, cheese, or milk. Beverages Sweetened drinks, such as soda or iced tea. The items listed above may not be a complete list of foods and beverages you should avoid. Contact a dietitian for more information. Questions to ask a health care provider Do I need to meet with a diabetes educator? Do I need to meet with a dietitian? What number can I call if I have questions? When are the best times to check my blood glucose? Where to find more information: American Diabetes Association: diabetes.org Academy of Nutrition and Dietetics: www.eatright.[employer] of Diabetes and Digestive and Kidney Diseases: [place] Association of Diabetes Care and Education Specialists: www.diabeteseducator.org Summary It is important to have healthy eating habits because your blood sugar (glucose) levels are greatly affected by what you eat and drink. A healthy meal plan will help you control your blood glucose and maintain a healthy lifestyle. Your health care provider may recommend that you work  with a dietitian to make a meal plan that is best for you. Keep in mind that carbohydrates (carbs) and alcohol have immediate effects on your blood glucose levels. It is important to count carbs and to use alcohol carefully. This information is not intended to replace advice given to you by your health care provider. Make sure you discuss any questions you have with your healthcare provider. Document Revised: 01/03/2019 Document Reviewed: 01/03/2019 Elsevier Patient Education  2021 [place] Prevention in the Home, Adult Falls can cause injuries  and can happen to people of all ages. There are many things you can do to make your home safe and to help prevent falls. Ask forhelp when making these changes. What actions can I take to prevent falls? General Instructions Use good lighting in all rooms. Replace any light bulbs that burn out. Turn on the lights in dark areas. Use night-lights. Keep items that you use often in easy-to-reach places. Lower the shelves around your home if needed. Set up your furniture so you have a clear path. Avoid moving your furniture around. Do not have throw rugs or other things on the floor that can make you trip. Avoid walking on wet floors. If any of your floors are uneven, fix them. Add color or contrast paint or tape to clearly mark and help you see: Grab bars or handrails. First and last steps of staircases. Where the edge of each step is. If you use a stepladder: Make sure that it is fully opened. Do not climb a closed stepladder. Make sure the sides of the stepladder are locked in place. Ask someone to hold the stepladder while you use it. Know where your pets are when moving through your home. What can I do in the bathroom?     Keep the floor dry. Clean up any water on the floor right away. Remove soap buildup in the tub or shower. Use nonskid mats or decals on the floor of the tub or shower. Attach bath mats securely with double-sided, nonslip rug tape. If you need to sit down in the shower, use a plastic, nonslip stool. Install grab bars by the toilet and in the tub and shower. Do not use towel bars as grab bars. What can I do in the bedroom? Make sure that you have a light by your bed that is easy to reach. Do not use any sheets or blankets for your bed that hang to the floor. Have a firm chair with side arms that you can use for support when you get dressed. What can I do in the kitchen? Clean up any spills right away. If you need to reach something above you, use a step stool with a  grab bar. Keep electrical cords out of the way. Do not use floor polish or wax that makes floors slippery. What can I do with my stairs? Do not leave any items on the stairs. Make sure that you have a light switch at the top and the bottom of the stairs. Make sure that there are handrails on both sides of the stairs. Fix handrails that are broken or loose. Install nonslip stair treads on all your stairs. Avoid having throw rugs at the top or bottom of the stairs. Choose a carpet that does not hide the edge of the steps on the stairs. Check carpeting to make sure that it is firmly attached to the stairs. Fix carpet that is loose or worn. What can I do on the outside  of my home? Use bright outdoor lighting. Fix the edges of walkways and driveways and fix any cracks. Remove anything that might make you trip as you walk through a door, such as a raised step or threshold. Trim any bushes or trees on paths to your home. Check to see if handrails are loose or broken and that both sides of all steps have handrails. Install guardrails along the edges of any raised decks and porches. Clear paths of anything that can make you trip, such as tools or rocks. Have leaves, snow, or ice cleared regularly. Use sand or salt on paths during winter. Clean up any spills in your garage right away. This includes grease or oil spills. What other actions can I take? Wear shoes that: Have a low heel. Do not wear high heels. Have rubber bottoms. Feel good on your feet and fit well. Are closed at the toe. Do not wear open-toe sandals. Use tools that help you move around if needed. These include: Canes. Walkers. Scooters. Crutches. Review your medicines with your doctor. Some medicines can make you feel dizzy. This can increase your chance of falling. Ask your doctor what else you can do to help prevent falls. Where to find more information Centers for Disease Control and Prevention, [place]:  http://www.wolf.info/ National Institute on Aging: http://kim-miller.com/ Contact a doctor if: You are afraid of falling at home. You feel weak, drowsy, or dizzy at home. You fall at home. Summary There are many simple things that you can do to make your home safe and to help prevent falls. Ways to make your home safe include removing things that can make you trip and installing grab bars in the bathroom. Ask for help when making these changes in your home. This information is not intended to replace advice given to you by your health care provider. Make sure you discuss any questions you have with your healthcare provider. Document Revised: 08/30/2019 Document Reviewed: 08/30/2019 Elsevier Patient Education  [place].

## 2020-09-03 LAB — LIPID PANEL
Chol/HDL Ratio: 2.9 ratio (ref 0.0–4.4)
Cholesterol, Total: 181 mg/dL (ref 100–199)
HDL: 63 mg/dL (ref >39)
LDL Chol Calc (NIH): 93 mg/dL (ref 0–99)
Triglycerides: 145 mg/dL (ref 0–149)
VLDL Cholesterol Cal: 25 mg/dL (ref 5–40)

## 2020-09-03 LAB — CBC WITH DIFF/PLATELET
Basophils Absolute: 0.1 10*3/uL (ref 0.0–0.2)
Basos: 1 %
EOS (ABSOLUTE): 0.1 10*3/uL (ref 0.0–0.4)
Eos: 1 %
Hematocrit: 39.1 % (ref 34.0–46.6)
Hemoglobin: 13.5 g/dL (ref 11.1–15.9)
Immature Grans (Abs): 0 10*3/uL (ref 0.0–0.1)
Immature Granulocytes: 1 %
Lymphocytes Absolute: 1.6 10*3/uL (ref 0.7–3.1)
Lymphs: 20 %
MCH: 33.4 pg — ABNORMAL HIGH (ref 26.6–33.0)
MCHC: 34.5 g/dL (ref 31.5–35.7)
MCV: 97 fL (ref 79–97)
Monocytes Absolute: 0.6 10*3/uL (ref 0.1–0.9)
Monocytes: 8 %
Neutrophils Absolute: 5.7 10*3/uL (ref 1.4–7.0)
Neutrophils: 69 %
Platelets: 273 10*3/uL (ref 150–450)
RBC: 4.04 x10E6/uL (ref 3.77–5.28)
RDW: 13.1 % (ref 11.7–15.4)
WBC: 8 10*3/uL (ref 3.4–10.8)

## 2020-09-03 LAB — COMPREHENSIVE METABOLIC PANEL
ALT: 19 IU/L (ref 0–32)
AST: 19 IU/L (ref 0–40)
Albumin/Globulin Ratio: 1.7 (ref 1.2–2.2)
Albumin: 4.5 g/dL (ref 3.7–4.7)
Alkaline Phosphatase: 99 IU/L (ref 44–121)
BUN/Creatinine Ratio: 10 — ABNORMAL LOW (ref 12–28)
BUN: 10 mg/dL (ref 8–27)
Bilirubin Total: 0.6 mg/dL (ref 0.0–1.2)
CO2: 25 mmol/L (ref 20–29)
Calcium: 10.1 mg/dL (ref 8.7–10.3)
Chloride: 97 mmol/L (ref 96–106)
Creatinine, Ser: 1.02 mg/dL — ABNORMAL HIGH (ref 0.57–1.00)
Globulin, Total: 2.6 g/dL (ref 1.5–4.5)
Glucose: 115 mg/dL — ABNORMAL HIGH (ref 65–99)
Potassium: 4.5 mmol/L (ref 3.5–5.2)
Sodium: 137 mmol/L (ref 134–144)
Total Protein: 7.1 g/dL (ref 6.0–8.5)
eGFR: 56 mL/min/{1.73_m2} — ABNORMAL LOW (ref >59)

## 2020-09-03 LAB — B12 AND FOLATE PANEL
Folate: 15.8 ng/mL (ref >3.0)
Vitamin B-12: 1153 pg/mL (ref 232–1245)

## 2020-09-03 LAB — HEMOGLOBIN A1C
Est. average glucose Bld gHb Est-mCnc: 137 mg/dL
Hgb A1c MFr Bld: 6.4 % — ABNORMAL HIGH (ref 4.8–5.6)

## 2020-09-03 LAB — TSH: TSH: 3.44 u[IU]/mL (ref 0.450–4.500)

## 2020-09-03 LAB — CARDIOVASCULAR RISK ASSESSMENT

## 2020-09-04 ENCOUNTER — Other Ambulatory Visit: Payer: Self-pay

## 2020-09-04 ENCOUNTER — Ambulatory Visit (INDEPENDENT_AMBULATORY_CARE_PROVIDER_SITE_OTHER): Payer: Medicare HMO | Admitting: Podiatry

## 2020-09-04 DIAGNOSIS — M2011 Hallux valgus (acquired), right foot: Secondary | ICD-10-CM

## 2020-09-04 DIAGNOSIS — E1151 Type 2 diabetes mellitus with diabetic peripheral angiopathy without gangrene: Secondary | ICD-10-CM

## 2020-09-04 DIAGNOSIS — M21611 Bunion of right foot: Secondary | ICD-10-CM

## 2020-09-04 DIAGNOSIS — M2042 Other hammer toe(s) (acquired), left foot: Secondary | ICD-10-CM

## 2020-09-04 DIAGNOSIS — M47816 Spondylosis without myelopathy or radiculopathy, lumbar region: Secondary | ICD-10-CM | POA: Diagnosis not present

## 2020-09-04 DIAGNOSIS — I10 Essential (primary) hypertension: Secondary | ICD-10-CM | POA: Diagnosis not present

## 2020-09-04 DIAGNOSIS — M2041 Other hammer toe(s) (acquired), right foot: Secondary | ICD-10-CM

## 2020-09-04 DIAGNOSIS — Z6832 Body mass index (BMI) 32.0-32.9, adult: Secondary | ICD-10-CM | POA: Diagnosis not present

## 2020-09-04 NOTE — Progress Notes (Signed)
Patient presented for foam casting for 3 pair custom diabetic shoe inserts. Patient is measured with a brannock device to be a size 8 medium  Diabetic shoes are chosen from the safe step catalog. The shoes chosen are A 600  The patient will be contacted when the shoes and inserts are ready to be picked up

## 2020-09-05 ENCOUNTER — Telehealth: Payer: Self-pay | Admitting: Family Medicine

## 2020-09-05 NOTE — Telephone Encounter (Signed)
   Amy Mccann has been scheduled for the following appointment:  WHAT: BONE DENSITY WHERE: [facility] DATE: 10/16/20 TIME: 9:00 AM ARRIVAL  A message has been left for the patient.

## 2020-09-23 ENCOUNTER — Telehealth: Payer: Self-pay

## 2020-09-23 NOTE — Telephone Encounter (Signed)
Pt calling asking for advice on "radioactive" injection that is done in 3 steps. Did not know name. States the neuro office will be doing these. She is concerned due to her history. Would like Dr. Alyse Low advice.   Harrell Lark 09/23/20 3:37 PM

## 2020-09-27 ENCOUNTER — Other Ambulatory Visit: Payer: Self-pay | Admitting: Family Medicine

## 2020-09-27 LAB — VITAMIN D 25 HYDROXY (VIT D DEFICIENCY, FRACTURES): Vit D, 25-Hydroxy: 31 ng/mL (ref 30.0–100.0)

## 2020-09-27 LAB — SPECIMEN STATUS REPORT

## 2020-09-27 MED ORDER — VITAMIN D (ERGOCALCIFEROL) 1.25 MG (50000 UNIT) PO CAPS: 50000.0000 [IU] | ORAL_CAPSULE | ORAL | 0 refills | Status: DC

## 2020-09-30 DIAGNOSIS — M47816 Spondylosis without myelopathy or radiculopathy, lumbar region: Secondary | ICD-10-CM | POA: Diagnosis not present

## 2020-10-01 DIAGNOSIS — C73 Malignant neoplasm of thyroid gland: Secondary | ICD-10-CM | POA: Diagnosis not present

## 2020-10-01 DIAGNOSIS — E890 Postprocedural hypothyroidism: Secondary | ICD-10-CM | POA: Diagnosis not present

## 2020-10-01 DIAGNOSIS — E871 Hypo-osmolality and hyponatremia: Secondary | ICD-10-CM | POA: Diagnosis not present

## 2020-10-04 ENCOUNTER — Other Ambulatory Visit: Payer: Self-pay | Admitting: Family Medicine

## 2020-10-10 ENCOUNTER — Telehealth: Payer: Self-pay | Admitting: Podiatry

## 2020-10-10 NOTE — Telephone Encounter (Signed)
Diabetic shoes/inserts in [place] office.. lvm for pt to call to schedule an appt to pick them up.Marland KitchenMarland Kitchen

## 2020-10-11 ENCOUNTER — Telehealth: Payer: Self-pay | Admitting: Podiatry

## 2020-10-11 NOTE — Telephone Encounter (Signed)
Pt left voicemail yesterday afternoon asking if she would have to pay or has a balance for the diabetic shoes..  I returned call and left a message per medicare regulations we cannot bill for the shoes until pt picks them up and takes them home. So as of now there is no balance and if there is one once we bill the insurance they will send pt a bill.

## 2020-10-12 ENCOUNTER — Telehealth: Payer: Self-pay

## 2020-10-12 NOTE — Telephone Encounter (Signed)
Called pt left VM to call back to schedule AWV.  KP

## 2020-10-16 DIAGNOSIS — M85852 Other specified disorders of bone density and structure, left thigh: Secondary | ICD-10-CM | POA: Diagnosis not present

## 2020-10-16 DIAGNOSIS — N959 Unspecified menopausal and perimenopausal disorder: Secondary | ICD-10-CM | POA: Diagnosis not present

## 2020-10-18 DIAGNOSIS — E041 Nontoxic single thyroid nodule: Secondary | ICD-10-CM | POA: Diagnosis not present

## 2020-10-18 DIAGNOSIS — E890 Postprocedural hypothyroidism: Secondary | ICD-10-CM | POA: Diagnosis not present

## 2020-10-18 DIAGNOSIS — C73 Malignant neoplasm of thyroid gland: Secondary | ICD-10-CM | POA: Diagnosis not present

## 2020-10-18 DIAGNOSIS — Z85850 Personal history of malignant neoplasm of thyroid: Secondary | ICD-10-CM | POA: Diagnosis not present

## 2020-10-21 ENCOUNTER — Inpatient Hospital Stay: Payer: Medicare HMO | Attending: Hematology and Oncology | Admitting: Hematology and Oncology

## 2020-10-21 ENCOUNTER — Encounter: Payer: Self-pay | Admitting: Hematology and Oncology

## 2020-10-21 ENCOUNTER — Telehealth: Payer: Self-pay | Admitting: Hematology and Oncology

## 2020-10-21 DIAGNOSIS — M810 Age-related osteoporosis without current pathological fracture: Secondary | ICD-10-CM | POA: Diagnosis not present

## 2020-10-21 DIAGNOSIS — E890 Postprocedural hypothyroidism: Secondary | ICD-10-CM

## 2020-10-21 DIAGNOSIS — Z853 Personal history of malignant neoplasm of breast: Secondary | ICD-10-CM | POA: Diagnosis not present

## 2020-10-21 NOTE — Assessment & Plan Note (Signed)
Bone density exam reveals continued osteopenia. PCP is managing this for her. She will repeat imaging in 2 years.

## 2020-10-21 NOTE — Assessment & Plan Note (Signed)
Most recent mammogram benign. No evidence of disease recurrence. She will repeat mammogram next July. We will see her in September of next year for re-evaluation.

## 2020-10-21 NOTE — Progress Notes (Signed)
[facility]  [address] [place],  [place]  [address] [phone]  Clinic Day:  10/21/2020  Referring physician: Rochel Brome, MD  ASSESSMENT & PLAN:   Assessment & Plan: History of left breast cancer Most recent mammogram benign. No evidence of disease recurrence. She will repeat mammogram next July. We will see her in September of next year for re-evaluation.  Age-related osteoporosis without current pathological fracture Bone density exam reveals continued osteopenia. PCP is managing this for her. She will repeat imaging in 2 years.   Postoperative hypothyroidism Thyroid scan benign. This is being managed by her endocrinologist.     The patient understands the plans discussed today and is in agreement with them.  She knows to contact our office if she develops concerns prior to her next appointment.     Melodye Ped, NP  [facility] [name] [place] [address] Dept: [phone] Dept Fax: [phone]   No orders of the defined types were placed in this encounter.     CHIEF COMPLAINT:  CC: A 80 year old female with remote history of breast cancer; thyroid cancer in 2017  Current Treatment:  Surveillance   HISTORY OF PRESENT ILLNESS:   Oncology History   No history exists.   1. Remote history of hormone receptor positive breast cancer.  She remains without  evidence of disease.   2. History of thyroid cancer in July 2017 treated with total thyroidectomy and radioactive iodine.  She continues to follow with Dr. Tamala Julian for this, and is scheduled at the end of the month.    INTERVAL HISTORY:  Tytana is here today for repeat clinical assessment. She has been well since last visit. Recent imaging of the thyroid is benign as well as recent mammogram. Bone density exam reveals continued osteopenia for which her PCP is treating. She denies fever, chills,  nausea or vomiting. She denies issue with bowel or bladder. She denies shortness of breath, chest pain or cough.   REVIEW OF SYSTEMS:  Review of Systems  Constitutional:  Negative for appetite change, chills, diaphoresis, fatigue, fever and unexpected weight change.  HENT:   Negative for hearing loss, lump/mass, mouth sores, nosebleeds, sore throat, tinnitus, trouble swallowing and voice change.   Eyes:  Negative for eye problems and icterus.  Respiratory:  Negative for chest tightness, cough, hemoptysis, shortness of breath and wheezing.   Cardiovascular:  Negative for chest pain, leg swelling and palpitations.  Gastrointestinal:  Negative for abdominal distention, abdominal pain, blood in stool, constipation, diarrhea, nausea, rectal pain and vomiting.  Endocrine: Negative for hot flashes.  Genitourinary:  Negative for bladder incontinence, difficulty urinating, dyspareunia, dysuria, frequency, hematuria, menstrual problem, nocturia, pelvic pain, vaginal bleeding and vaginal discharge.   Musculoskeletal:  Positive for back pain (chronic). Negative for arthralgias, flank pain, gait problem, myalgias, neck pain and neck stiffness.  Skin:  Negative for itching, rash and wound.  Neurological:  Negative for dizziness, extremity weakness, gait problem, headaches, light-headedness, numbness, seizures and speech difficulty.  Hematological:  Negative for adenopathy. Does not bruise/bleed easily.  Psychiatric/Behavioral:  Negative for confusion, decreased concentration, depression, sleep disturbance and suicidal ideas. The patient is not nervous/anxious.     VITALS:  Blood pressure (!) 159/71, pulse 72, temperature 98.9 F (37.2 C), temperature source Oral, resp. rate 18, height 5' (1.524 m), weight 165 lb 3.2 oz (74.9 kg), SpO2 95 %.  Wt Readings from Last 3 Encounters:  10/21/20 165 lb 3.2 oz (74.9 kg)  09/02/20 163 lb (73.9 kg)  06/11/20 164 lb (74.4 kg)    Body mass index is 32.26  kg/m.  Performance status (ECOG): 1 - Symptomatic but completely ambulatory  PHYSICAL EXAM:  Physical Exam Constitutional:      General: She is not in acute distress.    Appearance: Normal appearance. She is normal weight. She is not ill-appearing, toxic-appearing or diaphoretic.  HENT:     Head: Normocephalic and atraumatic.     Nose: Nose normal. No congestion or rhinorrhea.     Mouth/Throat:     Mouth: Mucous membranes are moist.     Pharynx: Oropharynx is clear. No oropharyngeal exudate or posterior oropharyngeal erythema.  Eyes:     General: No scleral icterus.       Right eye: No discharge.        Left eye: No discharge.     Extraocular Movements: Extraocular movements intact.     Conjunctiva/sclera: Conjunctivae normal.     Pupils: Pupils are equal, round, and reactive to light.  Neck:     Vascular: No carotid bruit.  Cardiovascular:     Rate and Rhythm: Normal rate and regular rhythm.     Heart sounds: No murmur heard.   No friction rub. No gallop.  Pulmonary:     Effort: Pulmonary effort is normal. No respiratory distress.     Breath sounds: Normal breath sounds. No stridor. No wheezing, rhonchi or rales.  Chest:     Chest wall: No mass, lacerations, deformity, swelling, tenderness, crepitus or edema. There is no dullness to percussion.  Breasts:    Breasts are symmetrical.     Right: Normal. No swelling, bleeding, inverted nipple, mass, nipple discharge, skin change or tenderness.  Abdominal:     General: Abdomen is flat. Bowel sounds are normal. There is no distension.     Palpations: There is no mass.     Tenderness: There is no abdominal tenderness. There is no right CVA tenderness, left CVA tenderness, guarding or rebound.     Hernia: No hernia is present.  Musculoskeletal:        General: No swelling, tenderness, deformity or signs of injury. Normal range of motion.     Cervical back: Normal range of motion and neck supple. No rigidity or tenderness.      Right lower leg: No edema.     Left lower leg: No edema.  Lymphadenopathy:     Cervical: No cervical adenopathy.     Upper Body:     Right upper body: No supraclavicular, axillary or pectoral adenopathy.     Left upper body: No supraclavicular, axillary or pectoral adenopathy.  Skin:    General: Skin is warm and dry.     Capillary Refill: Capillary refill takes less than 2 seconds.     Coloration: Skin is not jaundiced or pale.     Findings: No bruising, erythema, lesion or rash.  Neurological:     General: No focal deficit present.     Mental Status: She is alert and oriented to person, place, and time. Mental status is at baseline.     Cranial Nerves: No cranial nerve deficit.     Sensory: No sensory deficit.     Motor: No weakness.     Coordination: Coordination normal.     Gait: Gait normal.     Deep Tendon Reflexes: Reflexes normal.  Psychiatric:        Mood and Affect:  Mood normal.        Behavior: Behavior normal.        Thought Content: Thought content normal.        Judgment: Judgment normal.   LABS:   CBC Latest Ref Rng & Units 09/02/2020 06/11/2020 03/05/2020  WBC 3.4 - 10.8 x10E3/uL 8.0 7.8 8.3  Hemoglobin 11.1 - 15.9 g/dL 13.5 12.5 13.4  Hematocrit 34.0 - 46.6 % 39.1 36.8 39.2  Platelets 150 - 450 x10E3/uL 273 274 286   CMP Latest Ref Rng & Units 09/02/2020 06/11/2020 03/05/2020  Glucose 65 - 99 mg/dL 115(H) 128(H) 117(H)  BUN 8 - 27 mg/dL '10 14 12  '$ Creatinine 0.57 - 1.00 mg/dL 1.02(H) 1.10(H) 0.90  Sodium 134 - 144 mmol/L 137 136 135  Potassium 3.5 - 5.2 mmol/L 4.5 4.0 4.4  Chloride 96 - 106 mmol/L 97 99 99  CO2 20 - 29 mmol/L 25 20 18(L)  Calcium 8.7 - 10.3 mg/dL 10.1 9.0 9.8  Total Protein 6.0 - 8.5 g/dL 7.1 6.6 6.9  Total Bilirubin 0.0 - 1.2 mg/dL 0.6 0.3 0.4  Alkaline Phos 44 - 121 IU/L 99 100 100  AST 0 - 40 IU/L '19 19 22  '$ ALT 0 - 32 IU/L '19 17 19     '$ No results found for: CEA1 / No results found for: CEA1 No results found for: PSA1 No results found  for: [id number] No results found for: CAN125  No results found for: TOTALPROTELP, ALBUMINELP, A1GS, A2GS, BETS, BETA2SER, GAMS, MSPIKE, SPEI No results found for: TIBC, FERRITIN, IRONPCTSAT No results found for: LDH  STUDIES:  No results found.  Exam(s): [id number] US/US SOFT TISSUE HEAD/NECK CLINICAL DATA:  Other. History of thyroid cancer, post total thyroidectomy.  EXAM: THYROID ULTRASOUND  TECHNIQUE: Ultrasound examination of the thyroid gland and adjacent soft tissues was performed.  COMPARISON:  07/16/2015; ultrasound-guided left inferior thyroid nodule fine-needle aspiration-07/26/2015  FINDINGS: Isthmus: Surgically absent. There is no residual nodular soft tissue within the isthmic resection bed.  Right lobe: Surgically absent. There is no residual nodular soft tissue within the right lobectomy resection bed.  Left lobe: Surgically absent. There is no residual nodular soft tissue within the left lobectomy resection bed.  _________________________________________________________  No regional cervical lymphadenopathy.  IMPRESSION: Post total thyroidectomy without evidence of residual or locally recurrent disease.   Electronically Signed   By: Sandi Mariscal M.D.   On: 10/18/2020 12:25  Electronically Signed By: Sandi Mariscal MD  Electronically Signed Date/Time: 09/09/221228 Dictate Date/Time: 10/18/20 1218 Exam(s): [id number] DEXA/DG DEXA EXAM: DUAL X-RAY ABSORPTIOMETRY (DXA) FOR BONE MINERAL DENSITY  IMPRESSION: Jeanell Prest completed a BMD test on 10/16/2020 using the [place] (analysis version: 13.60) manufactured by [employer]. The following summarizes the results of our evaluation. Technologist: [facility] PATIENT BIOGRAPHICAL: Name: AZIZI, LILE Patient ID: [id number] [facility] Birth Date: 1940/04/28 Height: 59.0 in. Gender: Female Exam Date: 10/16/2020 Weight: 163.0 lbs. Indications:              Fractures:             Treatments:  ASSESSMENT: The BMD  measured at Femur Neck Left is 0.829 g/cm2 with a T-score of -1.5. This patient is considered osteopenic according to [facility]) criteria. The scan quality is good. Lumbar spine was not utilized due to degenerative changes. Site Region Measured Measured WHO Young Adult BMD Date      Age      Classification T-score DualFemur Neck Left 10/16/2020 79.8 Osteopenia -1.5 0.829  g/cm2 DualFemur Neck Left 08/05/2018 77.6 Osteopenia -1.1 0.878 g/cm2 DualFemur Neck Left 03/25/2016 75.3 Osteopenia -1.2 0.877 g/cm2 DualFemur Neck Left 12/13/2009 69.0 Normal -0.9 0.912 g/cm2  DualFemur Total Mean 10/16/2020 79.8 Normal -0.8 0.904 g/cm2 DualFemur Total Mean 08/05/2018 77.6 Normal -0.6 0.926 g/cm2 DualFemur Total Mean 03/25/2016 75.3 Normal -0.8 0.911 g/cm2 DualFemur Total Mean 12/13/2009 69.0 Normal -0.4 0.958 g/cm2  Left Forearm Radius 33% 10/16/2020 79.8 Normal 0.8 0.950 g/cm2 Left Forearm Radius 33% 08/05/2018 77.6 Normal -0.5 0.829 g/cm2 Left Forearm Radius 33% 03/25/2016 75.3 Normal -0.9 0.801 g/cm2  World Health Organization [facility]) criteria for post-menopausal, Caucasian Women: Normal:       T-score at or above -1 SD Osteopenia:   T-score between -1 and -2.5 SD Osteoporosis: T-score at or below -2.5 SD  RECOMMENDATIONS: 1. All patients should optimize calcium and vitamin D intake. 2. Consider FDA- approved medical therapies in post menopausal women and men aged 43 years and older, based on the following: a. A hip or vertebral (clinical or morphometric) fracture. b. T- score < 2.5 of the femoral neck or spine after appropriate evaluation to exclude secondary causes. c. Low bone mass (T- score between -1.0 and -2.5 at femoral neck or spine) and a 10 -year probability of a hip fracture > 3% or a 10 -year probability of a major osteoporosis- related fracture > 20% based on the [place]- adapted WHO algorithm. d. Clinician judgement and/ or patient preferences may indicate treatment for  people with 10- year fracture probabilities above or below these levels.  FOLLOW-UP: People with diagnosed cases of osteoporosis or at high risk for fracture should have regular bone mineral density tests. For patients eligible for Medicare, routine testing is allowed once every 2 years. The testing frequency can be increased to one year for patients who have rapidly progressing disease, those who are receiving medical therapy to restore bone mass, or have additional risk factors. I have reviewed this report and agree with the above findings. Dear Larene Beach JEFFERY HEATON,  Your patient KENYARDA CARDILLO completed a FRAX assessment on 10/16/2020 using the [place] (analysis version: 13.60) manufactured by [employer]. The following summarizes the results of our evaluation.  PATIENT BIOGRAPHICAL: Name: CORRI, MASCARI Patient ID: [id number] [facility] Birth Date: July 21, 1940 Height:    59.0 in. Gender: Female [age] Weight: 163.0 lbs. Ethnicity:  White                               Exam Date: 10/16/2020  FRAX* RESULTS:  (version: 3.1) 10-year Probability of Fracture1 Major Osteoporotic Fracture2 Hip Fracture 12.8% 2.9% Population: [place] (Caucasian) Risk Factors: None  Based on DualFemur (Left) Neck BMD  1 -The 10-year probability of fracture may be lower than reported if the patient has received treatment. 2 -Major Osteoporotic Fracture: Clinical Spine, Forearm, Hip or Shoulder  *FRAX is a Materials engineer of the [employer] for Metabolic Bone Disease, a [place] (WHO) [facility].  ASSESSMENT: The probability of a major osteoporotic fracture is 12.8% within the next ten years.  The probability of a hip fracture is 2.9% within the next ten years.  I have reviewed this report and agree with the above findings.  Mark A. Thornton Papas, M.D.  [facility] Radiology   Electronically Signed   By: Lavonia Dana M.D.   On:  10/16/2020 14:29  Electronically Signed By: Burnetta Sabin MD  Electronically Signed Date/Time: 09/07/221431  Dictate Date/Time: 10/16/20 1428 Exam(s): [id number] MAM/MAM DIGITAL TOMO SCREENING R CLINICAL DATA:  Screening.  EXAM: DIGITAL SCREENING UNILATERAL RIGHT MAMMOGRAM WITH CAD AND TOMOSYNTHESIS  TECHNIQUE: Right screening digital craniocaudal and mediolateral oblique mammograms were obtained. Right screening digital breast tomosynthesis was performed. The images were evaluated with computer-aided detection.  COMPARISON:  Previous exam(s).  ACR Breast Density Category c: The breast tissue is heterogeneously dense, which may obscure small masses.  FINDINGS: The patient has had a left mastectomy. There are no findings suspicious for malignancy.  IMPRESSION: No mammographic evidence of malignancy. A result letter of this screening mammogram will be mailed directly to the patient.  RECOMMENDATION: Screening mammogram in one year.  (Code:SM-R-39M)  BI-RADS CATEGORY  1: Negative.   Electronically Signed   By: Nolon Nations M.D.   On: 08/29/2020 08:41  Electronically Signed By: Glenice Bow MD  Electronically Signed Date/Time: 07/21/220843 Dictate Date/Time: 08/29/20 0839 HISTORY:   Past Medical History:  Diagnosis Date   Age-related osteoporosis without current pathological fracture 07/03/2019   Cancer ([place]) 1990   Left Breast Cancer    Cancer ([place]) 2017   Thyroid Cancer   Cervicalgia 07/03/2019   Diabetes mellitus without complication (HCC)    Generalized anxiety disorder    GERD (gastroesophageal reflux disease)    Hypertension    Low back pain with sciatica 07/03/2019   Obesity due to excess calories 07/03/2019   S/P repair of paraesophageal hernia 10/20/2013   S/P TKR (total knee replacement) 07/25/2012    Past Surgical History:  Procedure Laterality Date   24 HOUR [place] STUDY N/A 08/07/2013   Procedure: 24 HOUR [place] STUDY;  Surgeon: Missy Sabins, MD;   Location: [facility];  Service: Endoscopy;  Laterality: N/A;   APPENDECTOMY  03/17/2016   ESOPHAGEAL MANOMETRY N/A 08/07/2013   Procedure: ESOPHAGEAL MANOMETRY (EM);  Surgeon: Missy Sabins, MD;  Location: [facility];  Service: Endoscopy;  Laterality: N/A;   JOINT REPLACEMENT     MASTECTOMY Left 1990   THYROIDECTOMY Bilateral 08/2015    History reviewed. No pertinent family history.  Social History:  reports that she has never smoked. She has never used smokeless tobacco. She reports that she does not drink alcohol and does not use drugs.The patient is alone  today.  Allergies:  Allergies  Allergen Reactions   Codeine Itching and Rash   Penicillins Rash    Current Medications: Current Outpatient Medications  Medication Sig Dispense Refill   Accu-Chek Softclix Lancets lancets Use as instructed 100 each 5   ascorbic acid (VITAMIN C) 500 MG tablet Take by mouth.     Clotrimazole 1 % OINT Apply 1 application topically in the morning and at bedtime. 56.7 g 1   esomeprazole (NEXIUM) 40 MG capsule Take 40 mg by mouth daily.     fluconazole (DIFLUCAN) 200 MG tablet      Ginger, Zingiber officinalis, (GINGER ROOT PO) Take by mouth.     glucose blood (ACCU-CHEK AVIVA PLUS) test strip Once daily fasting 100 strip 3   ibuprofen (ADVIL) 600 MG tablet      levothyroxine (SYNTHROID) 112 MCG tablet TAKE 1 TABLET(112 MCG) BY MOUTH DAILY BEFORE BREAKFAST 90 tablet 1   losartan (COZAAR) 25 MG tablet TAKE 1 TABLET BY MOUTH DAILY 90 tablet 1   ondansetron (ZOFRAN ODT) 8 MG disintegrating tablet Take 0.5 tablets (4 mg total) by mouth every 8 (eight) hours as needed for nausea or vomiting. 30 tablet 1   polyethylene glycol powder (GLYCOLAX/MIRALAX)  17 GM/SCOOP powder Take by mouth.     psyllium (METAMUCIL) 58.6 % packet Take by mouth.     rosuvastatin (CRESTOR) 5 MG tablet TAKE 1 TABLET(5 MG) BY MOUTH DAILY 90 tablet 1   triamcinolone cream (KENALOG) 0.1 %      Vitamin D, Ergocalciferol, (DRISDOL)  1.25 MG (50000 UNIT) CAPS capsule Take 1 capsule (50,000 Units total) by mouth every 7 (seven) days. 15 capsule 0   vitamin E 180 MG (400 UNITS) capsule Take by mouth.     No current facility-administered medications for this visit.

## 2020-10-21 NOTE — Assessment & Plan Note (Signed)
Thyroid scan benign. This is being managed by her endocrinologist.

## 2020-10-21 NOTE — Telephone Encounter (Signed)
Per 9/12 LOS next appt scheduled and confirmed with patient

## 2020-10-23 DIAGNOSIS — I10 Essential (primary) hypertension: Secondary | ICD-10-CM | POA: Diagnosis not present

## 2020-10-23 DIAGNOSIS — M47816 Spondylosis without myelopathy or radiculopathy, lumbar region: Secondary | ICD-10-CM | POA: Diagnosis not present

## 2020-10-23 DIAGNOSIS — Z6832 Body mass index (BMI) 32.0-32.9, adult: Secondary | ICD-10-CM | POA: Diagnosis not present

## 2020-10-24 ENCOUNTER — Other Ambulatory Visit: Payer: Self-pay

## 2020-10-24 ENCOUNTER — Ambulatory Visit: Payer: Medicare HMO | Admitting: Podiatry

## 2020-10-24 DIAGNOSIS — E1151 Type 2 diabetes mellitus with diabetic peripheral angiopathy without gangrene: Secondary | ICD-10-CM

## 2020-10-24 NOTE — Progress Notes (Signed)
Pt was present to [facility]. Pt states the shoes are not the color and style she wants. Pt states she choose the [employer] was advised we will re-order using same size and we will give her a call when they are ready to pick up.

## 2020-10-28 ENCOUNTER — Encounter: Payer: Self-pay | Admitting: Family Medicine

## 2020-10-28 ENCOUNTER — Encounter: Payer: Self-pay | Admitting: Nurse Practitioner

## 2020-10-28 ENCOUNTER — Ambulatory Visit (INDEPENDENT_AMBULATORY_CARE_PROVIDER_SITE_OTHER): Payer: Medicare HMO | Admitting: Nurse Practitioner

## 2020-10-28 VITALS — BP 126/60 | HR 75 | Temp 97.0°F | Ht 60.0 in | Wt 162.0 lb

## 2020-10-28 DIAGNOSIS — M545 Low back pain, unspecified: Secondary | ICD-10-CM

## 2020-10-28 DIAGNOSIS — G8929 Other chronic pain: Secondary | ICD-10-CM

## 2020-10-28 DIAGNOSIS — Z23 Encounter for immunization: Secondary | ICD-10-CM

## 2020-10-28 LAB — POCT URINALYSIS DIPSTICK
Bilirubin, UA: NEGATIVE
Blood, UA: NEGATIVE
Glucose, UA: NEGATIVE
Ketones, UA: NEGATIVE
Leukocytes, UA: NEGATIVE
Nitrite, UA: NEGATIVE
Protein, UA: NEGATIVE
Spec Grav, UA: 1.01 (ref 1.010–1.025)
Urobilinogen, UA: NEGATIVE E.U./dL — AB
pH, UA: 6 (ref 5.0–8.0)

## 2020-10-28 MED ORDER — IBUPROFEN 600 MG PO TABS: 600.0000 mg | ORAL_TABLET | Freq: Three times a day (TID) | ORAL | 1 refills | Status: DC | PRN

## 2020-10-28 NOTE — Patient Instructions (Addendum)
Take Ibuprofen as directed for back pain Do back exercises Contact back specialist for earlier appointment Follow-up as needed  Chronic Back Pain When back pain lasts longer than 3 months, it is called chronic back pain. Pain may get worse at certain times (flare-ups). There are things you can do at home to manage your pain. Follow these instructions at home: Pay attention to any changes in your symptoms. Take these actions to help with your pain: Managing pain and stiffness   If told, put ice on the painful area. Your doctor may tell you to use ice for 24-48 hours after the flare-up starts. To do this: Put ice in a plastic bag. Place a towel between your skin and the bag. Leave the ice on for 20 minutes, 2-3 times a day. If told, put heat on the painful area. Do this as often as told by your doctor. Use the heat source that your doctor recommends, such as a moist heat pack or a heating pad. Place a towel between your skin and the heat source. Leave the heat on for 20-30 minutes. Take off the heat if your skin turns bright red. This is especially important if you are unable to feel pain, heat, or cold. You may have a greater risk of getting burned. Soak in a warm bath. This can help relieve pain. Activity  Avoid bending and other activities that make pain worse. When standing: Keep your upper back and neck straight. Keep your shoulders pulled back. Avoid slouching. When sitting: Keep your back straight. Relax your shoulders. Do not round your shoulders or pull them backward. Do not sit or stand in one place for long periods of time. Take short rest breaks during the day. Lying down or standing is usually better than sitting. Resting can help relieve pain. When sitting or lying down for a long time, do some mild activity or stretching. This will help to prevent stiffness and pain. Get regular exercise. Ask your doctor what activities are safe for you. Do not lift anything that is  heavier than 10 lb (4.5 kg) or the limit that you are told, until your doctor says that it is safe. To prevent injury when you lift things: Bend your knees. Keep the weight close to your body. Avoid twisting. Sleep on a firm mattress. Try lying on your side with your knees slightly bent. If you lie on your back, put a pillow under your knees. Medicines Treatment may include medicines for pain and swelling taken by mouth or put on the skin, prescription pain medicine, or muscle relaxants. Take over-the-counter and prescription medicines only as told by your doctor. Ask your doctor if the medicine prescribed to you: Requires you to avoid driving or using machinery. Can cause trouble pooping (constipation). You may need to take these actions to prevent or treat trouble pooping: Drink enough fluid to keep your pee (urine) pale yellow. Take over-the-counter or prescription medicines. Eat foods that are high in fiber. These include beans, whole grains, and fresh fruits and vegetables. Limit foods that are high in fat and sugars. These include fried or sweet foods. General instructions Do not use any products that contain nicotine or tobacco, such as cigarettes, e-cigarettes, and chewing tobacco. If you need help quitting, ask your doctor. Keep all follow-up visits as told by your doctor. This is important. Contact a doctor if: Your pain does not get better with rest or medicine. Your pain gets worse, or you have new pain. You have a  high fever. You lose weight very quickly. You have trouble doing your normal activities. Get help right away if: One or both of your legs or feet feel weak. One or both of your legs or feet lose feeling (have numbness). You have trouble controlling when you poop (have a bowel movement) or pee (urinate). You have bad back pain and: You feel like you may vomit (nauseous), or you vomit. You have pain in your belly (abdomen). You have shortness of breath. You  faint. Summary When back pain lasts longer than 3 months, it is called chronic back pain. Pain may get worse at certain times (flare-ups). Use ice and heat as told by your doctor. Your doctor may tell you to use ice after flare-ups. This information is not intended to replace advice given to you by your health care provider. Make sure you discuss any questions you have with your health care provider. Document Revised: 03/08/2019 Document Reviewed: 03/08/2019 Back Exercises The following exercises strengthen the muscles that help to support the trunk (torso) and back. They also help to keep the lower back flexible. Doing these exercises can help to prevent or lessen existing low back pain. If you have back pain or discomfort, try doing these exercises 2-3 times each day or as told by your health care provider. As your pain improves, do them once each day, but increase the number of times that you repeat the steps for each exercise (do more repetitions). To prevent the recurrence of back pain, continue to do these exercises once each day or as told by your health care provider. Do exercises exactly as told by your health care provider and adjust them as directed. It is normal to feel mild stretching, pulling, tightness, or discomfort as you do these exercises, but you should stop right away if you feel sudden pain or your pain gets worse. Exercises Single knee to chest Repeat these steps 3-5 times for each leg: Lie on your back on a firm bed or the floor with your legs extended. Bring one knee to your chest. Your other leg should stay extended and in contact with the floor. Hold your knee in place by grabbing your knee or thigh with both hands and hold. Pull on your knee until you feel a gentle stretch in your lower back or buttocks. Hold the stretch for 10-30 seconds. Slowly release and straighten your leg. Pelvic tilt Repeat these steps 5-10 times: Lie on your back on a firm bed or the floor  with your legs extended. Bend your knees so they are pointing toward the ceiling and your feet are flat on the floor. Tighten your lower abdominal muscles to press your lower back against the floor. This motion will tilt your pelvis so your tailbone points up toward the ceiling instead of pointing to your feet or the floor. With gentle tension and even breathing, hold this position for 5-10 seconds. Cat-cow Repeat these steps until your lower back becomes more flexible: Get into a hands-and-knees position on a firm bed or the floor. Keep your hands under your shoulders, and keep your knees under your hips. You may place padding under your knees for comfort. Let your head hang down toward your chest. Contract your abdominal muscles and point your tailbone toward the floor so your lower back becomes rounded like the back of a cat. Hold this position for 5 seconds. Slowly lift your head, let your abdominal muscles relax, and point your tailbone up toward the ceiling so  your back forms a sagging arch like the back of a cow. Hold this position for 5 seconds.  Press-ups Repeat these steps 5-10 times: Lie on your abdomen (face-down) on a firm bed or the floor. Place your palms near your head, about shoulder-width apart. Keeping your back as relaxed as possible and keeping your hips on the floor, slowly straighten your arms to raise the top half of your body and lift your shoulders. Do not use your back muscles to raise your upper torso. You may adjust the placement of your hands to make yourself more comfortable. Hold this position for 5 seconds while you keep your back relaxed. Slowly return to lying flat on the floor.  Bridges Repeat these steps 10 times: Lie on your back on a firm bed or the floor. Bend your knees so they are pointing toward the ceiling and your feet are flat on the floor. Your arms should be flat at your sides, next to your body. Tighten your buttocks muscles and lift your  buttocks off the floor until your waist is at almost the same height as your knees. You should feel the muscles working in your buttocks and the back of your thighs. If you do not feel these muscles, slide your feet 1-2 inches (2.5-5 cm) farther away from your buttocks. Hold this position for 3-5 seconds. Slowly lower your hips to the starting position, and allow your buttocks muscles to relax completely. If this exercise is too easy, try doing it with your arms crossed over your chest. Abdominal crunches Repeat these steps 5-10 times: Lie on your back on a firm bed or the floor with your legs extended. Bend your knees so they are pointing toward the ceiling and your feet are flat on the floor. Cross your arms over your chest. Tip your chin slightly toward your chest without bending your neck. Tighten your abdominal muscles and slowly raise your torso high enough to lift your shoulder blades a tiny bit off the floor. Avoid raising your torso higher than that because it can put too much stress on your lower back and does not help to strengthen your abdominal muscles. Slowly return to your starting position. Back lifts Repeat these steps 5-10 times: Lie on your abdomen (face-down) with your arms at your sides, and rest your forehead on the floor. Tighten the muscles in your legs and your buttocks. Slowly lift your chest off the floor while you keep your hips pressed to the floor. Keep the back of your head in line with the curve in your back. Your eyes should be looking at the floor. Hold this position for 3-5 seconds. Slowly return to your starting position. Contact a health care provider if: Your back pain or discomfort gets much worse when you do an exercise. Your worsening back pain or discomfort does not lessen within 2 hours after you exercise. If you have any of these problems, stop doing these exercises right away. Do not do them again unless your health care provider says that you  can. Get help right away if: You develop sudden, severe back pain. If this happens, stop doing the exercises right away. Do not do them again unless your health care provider says that you can. This information is not intended to replace advice given to you by your health care provider. Make sure you discuss any questions you have with your health care provider. Document Revised: 04/10/2020 Document Reviewed: 04/10/2020 Elsevier Patient Education  [place]  Elsevier Patient Education  2022 Elsevier Inc.  

## 2020-10-28 NOTE — Progress Notes (Signed)
Acute Office Visit  Subjective:    Patient ID: Amy Mccann, female    DOB: 07/31/1940, 80 y.o.   MRN: [id number]  Chief Complaint  Patient presents with   Back Pain  HPI Amy Mccann is a 80 year old Caucasian that presents with chronic back pain. Onset was 81-month ago. She recently [facility]   Back Pain  She reports chronic back pain. There was not an injury that may have caused the pain. The most recent episode started about a year ago and is gradually worsening. The pain is located in the left lumbar area without radiation. It is described as aching, is 8/10 in intensity, occurring intermittently. Symptoms are worse in the: morning  Aggravating factors: standing Relieving factors: none.  She has tried application of icy hot topical cream with mild relief.   Associated symptoms: No abdominal pain No bowel incontinence  No chest pain No dysuria   No fever No headaches  No joint pains No pelvic pain  No weakness in leg  No tingling in lower extremities  No urinary incontinence No weight loss   pain  Past Medical History:  Diagnosis Date   Age-related osteoporosis without current pathological fracture 07/03/2019   [place]   [place]    [place]   Thyroid Cancer   Cervicalgia 07/03/2019   [place]    Generalized anxiety disorder    GERD (gastroesophageal reflux disease)    Hypertension    Low back pain with sciatica 07/03/2019   Obesity due to excess calories 07/03/2019   S/P repair of paraesophageal hernia 10/20/2013   S/P TKR (total knee replacement) 07/25/2012    Past Surgical History:  Procedure Laterality Date   24 HOUR PFortescueSTUDY N/A 08/07/2013   Procedure: 24 HOUR PSopchoppySTUDY;  Surgeon: JMissy Sabins MD;  Location: [facility];  Service: Endoscopy;  Laterality: N/A;   APPENDECTOMY  03/17/2016   ESOPHAGEAL MANOMETRY N/A  08/07/2013   Procedure: ESOPHAGEAL MANOMETRY (EM);  Surgeon: JMissy Sabins MD;  Location: [facility];  Service: Endoscopy;  Laterality: N/A;   JOINT REPLACEMENT     MASTECTOMY Left 1990   THYROIDECTOMY Bilateral 08/2015    History reviewed. No pertinent family history.  Social History   Socioeconomic History   Marital status: Divorced    Spouse name: Not on file   Number of children: Not on file   Years of education: Not on file   Highest education level: Not on file  Occupational History   Not on file  Tobacco Use   Smoking status: Never   Smokeless tobacco: Never  Vaping Use   Vaping Use: Never used  Substance and Sexual Activity   Alcohol use: Never   Drug use: Never   Sexual activity: Not on file  Other Topics Concern   Not on file  Social History Narrative   Not on file   Social Determinants of Health   Financial Resource Strain: Not on file  Food Insecurity: Not on file  Transportation Needs: Not on file  Physical Activity: Not on file  Stress: Not on file  Social Connections: Not on file  Intimate Partner Violence: Not on file    Outpatient Medications Prior to Visit  Medication Sig Dispense Refill   Accu-Chek Softclix Lancets lancets Use as instructed 100 each 5   ascorbic acid (VITAMIN C) 500 MG tablet Take by mouth.  Clotrimazole 1 % OINT Apply 1 application topically in the morning and at bedtime. 56.7 g 1   esomeprazole (NEXIUM) 40 MG capsule Take 40 mg by mouth daily.     Ginger, Zingiber officinalis, (GINGER ROOT PO) Take by mouth.     glucose blood (ACCU-CHEK AVIVA PLUS) test strip Once daily fasting 100 strip 3   ibuprofen (ADVIL) 600 MG tablet      levothyroxine (SYNTHROID) 112 MCG tablet TAKE 1 TABLET(112 MCG) BY MOUTH DAILY BEFORE BREAKFAST 90 tablet 1   losartan (COZAAR) 25 MG tablet TAKE 1 TABLET BY MOUTH DAILY 90 tablet 1   ondansetron (ZOFRAN ODT) 8 MG disintegrating tablet Take 0.5 tablets (4 mg total) by mouth every 8 (eight) hours  as needed for nausea or vomiting. 30 tablet 1   polyethylene glycol powder (GLYCOLAX/MIRALAX) 17 GM/SCOOP powder Take by mouth.     psyllium (METAMUCIL) 58.6 % packet Take by mouth.     rosuvastatin (CRESTOR) 5 MG tablet TAKE 1 TABLET(5 MG) BY MOUTH DAILY 90 tablet 1   triamcinolone cream (KENALOG) 0.1 %      Vitamin D, Ergocalciferol, (DRISDOL) 1.25 MG (50000 UNIT) CAPS capsule Take 1 capsule (50,000 Units total) by mouth every 7 (seven) days. 15 capsule 0   vitamin E 180 MG (400 UNITS) capsule Take by mouth.     fluconazole (DIFLUCAN) 200 MG tablet      No facility-administered medications prior to visit.    Allergies  Allergen Reactions   Codeine Itching and Rash   Penicillins Rash    Review of Systems  Constitutional:  Negative for appetite change, fatigue and fever.  HENT:  Negative for congestion, ear pain, sinus pressure and sore throat.   Eyes:  Negative for pain.  Respiratory:  Negative for cough, chest tightness, shortness of breath and wheezing.   Cardiovascular:  Negative for chest pain and palpitations.  Gastrointestinal:  Negative for abdominal pain, constipation, diarrhea, nausea and vomiting.  Genitourinary:  Negative for dysuria and hematuria.  Musculoskeletal:  Positive for back pain. Negative for arthralgias, joint swelling and myalgias.  Skin:  Negative for rash.  Neurological:  Negative for dizziness, weakness and headaches.  Psychiatric/Behavioral:  Negative for dysphoric mood. The patient is not nervous/anxious.       Objective:    Physical Exam Vitals reviewed.  Constitutional:      Appearance: Normal appearance.  Cardiovascular:     Rate and Rhythm: Normal rate and regular rhythm.     Pulses: Normal pulses.     Heart sounds: Normal heart sounds.  Pulmonary:     Effort: Pulmonary effort is normal.     Breath sounds: Normal breath sounds.  Abdominal:     General: Bowel sounds are normal.     Palpations: Abdomen is soft.  Musculoskeletal:         General: Tenderness (left lower back) present.     Cervical back: Normal range of motion.  Skin:    General: Skin is warm and dry.     Capillary Refill: Capillary refill takes less than 2 seconds.  Neurological:     General: No focal deficit present.     Mental Status: She is alert and oriented to person, place, and time.  Psychiatric:        Mood and Affect: Mood normal.        Behavior: Behavior normal.        Thought Content: Thought content normal.        Judgment: Judgment  normal.    BP 126/60 (BP Location: Left Arm, Patient Position: Sitting)   Pulse 75   Temp (!) 97 F (36.1 C) (Temporal)   Ht 5' (1.524 m)   Wt 162 lb (73.5 kg)   SpO2 97%   BMI 31.64 kg/m  Wt Readings from Last 3 Encounters:  10/28/20 162 lb (73.5 kg)  10/21/20 165 lb 3.2 oz (74.9 kg)  09/02/20 163 lb (73.9 kg)    Health Maintenance Due  Topic Date Due   Hepatitis C Screening  Never done   Zoster Vaccines- Shingrix (1 of 2) Never done   COVID-19 Vaccine (4 - Booster for Pfizer series) 03/02/2020   INFLUENZA VACCINE  09/09/2020   OPHTHALMOLOGY EXAM  09/20/2020       Lab Results  Component Value Date   TSH 3.440 09/02/2020   Lab Results  Component Value Date   WBC 8.0 09/02/2020   HGB 13.5 09/02/2020   HCT 39.1 09/02/2020   MCV 97 09/02/2020   PLT 273 09/02/2020   Lab Results  Component Value Date   NA 137 09/02/2020   K 4.5 09/02/2020   CO2 25 09/02/2020   GLUCOSE 115 (H) 09/02/2020   BUN 10 09/02/2020   CREATININE 1.02 (H) 09/02/2020   BILITOT 0.6 09/02/2020   ALKPHOS 99 09/02/2020   AST 19 09/02/2020   ALT 19 09/02/2020   PROT 7.1 09/02/2020   ALBUMIN 4.5 09/02/2020   CALCIUM 10.1 09/02/2020   EGFR 56 (L) 09/02/2020   Lab Results  Component Value Date   CHOL 181 09/02/2020   Lab Results  Component Value Date   HDL 63 09/02/2020   Lab Results  Component Value Date   LDLCALC 93 09/02/2020   Lab Results  Component Value Date   TRIG 145 09/02/2020   Lab  Results  Component Value Date   CHOLHDL 2.9 09/02/2020   Lab Results  Component Value Date   HGBA1C 6.4 (H) 09/02/2020         Assessment & Plan:   1. Acute bilateral low back pain without sciatica - POCT urinalysis dipstick - ibuprofen (ADVIL) 600 MG tablet; Take 1 tablet (600 mg total) by mouth every 8 (eight) hours as needed.  Dispense: 30 tablet; Refill: 1  2. Need for vaccination - Flu Vaccine QUAD High Dose(Fluad)    Take Ibuprofen as directed for back pain Do back exercises Contact back specialist for earlier appointment Follow-up as needed   I,Lauren M Auman,acting as a scribe for [employer], NP.,have documented all relevant documentation on the behalf of Rip Harbour, NP,as directed by  Rip Harbour, NP while in the presence of Rip Harbour, NP.   I, Rip Harbour, NP, have reviewed all documentation for this visit. The documentation on 10/28/20 for the exam, diagnosis, procedures, and orders are all accurate and complete.   Follow-up: PRN  Signed, Jerrell Belfast, DNP 10/28/20 at 9:46 PM

## 2020-11-15 ENCOUNTER — Telehealth: Payer: Self-pay | Admitting: *Deleted

## 2020-11-15 NOTE — Chronic Care Management (AMB) (Signed)
  Chronic Care Management   Outreach Note  11/15/2020 Name: Amy Mccann MRN: [id number] DOB: 12-18-1940  Amy Mccann is a 80 y.o. year old female who is a primary care patient of Cox, Kirsten, MD. I reached out to [name] by phone today in response to a referral sent by Amy Mccann's primary care provider.  An unsuccessful telephone outreach was attempted today. The patient was referred to the case management team for assistance with care management and care coordination.   Follow Up Plan: A HIPAA compliant phone message was left for the patient providing contact information and requesting a return call. The care management team will reach out to the patient again over the next 7 days. If patient returns call to provider office, please advise to call [place] at [phone].  [place] Management  Direct Dial: [phone]

## 2020-11-21 ENCOUNTER — Other Ambulatory Visit: Payer: Self-pay

## 2020-11-21 ENCOUNTER — Ambulatory Visit (INDEPENDENT_AMBULATORY_CARE_PROVIDER_SITE_OTHER): Payer: Medicare HMO | Admitting: Podiatry

## 2020-11-21 DIAGNOSIS — M2011 Hallux valgus (acquired), right foot: Secondary | ICD-10-CM

## 2020-11-21 DIAGNOSIS — E1151 Type 2 diabetes mellitus with diabetic peripheral angiopathy without gangrene: Secondary | ICD-10-CM

## 2020-11-21 DIAGNOSIS — M2042 Other hammer toe(s) (acquired), left foot: Secondary | ICD-10-CM | POA: Diagnosis not present

## 2020-11-21 DIAGNOSIS — M2041 Other hammer toe(s) (acquired), right foot: Secondary | ICD-10-CM | POA: Diagnosis not present

## 2020-11-21 DIAGNOSIS — M21611 Bunion of right foot: Secondary | ICD-10-CM

## 2020-11-22 NOTE — Chronic Care Management (AMB) (Signed)
  Chronic Care Management   Outreach Note  11/22/2020 Name: Amy Mccann MRN: [id number] DOB: November 09, 1940  Amy Mccann is a 80 y.o. year old female who is a primary care patient of Cox, Kirsten, MD. I reached out to [name] by phone today in response to a referral sent by Amy Mccann's primary care provider.  A second unsuccessful telephone outreach was attempted today. The patient was referred to the case management team for assistance with care management and care coordination.   Follow Up Plan: A HIPAA compliant phone message was left for the patient providing contact information and requesting a return call.  The care management team will reach out to the patient again over the next 7 days.  If patient returns call to provider office, please advise to call [place]* at [phone].*  [place] Management  Direct Dial: [phone]

## 2020-11-28 NOTE — Progress Notes (Signed)
Patient presented to the office today to pick up diabetic shoes due to the patient not liking the previous shoes that were ordered.  Patient was satisfied with the pair of diabetic shoes

## 2020-11-29 NOTE — Chronic Care Management (AMB) (Signed)
  Chronic Care Management   Outreach Note  11/29/2020 Name: Amy Mccann MRN: [id number] DOB: 10/11/1940  Amy Mccann is a 80 y.o. year old female who is a primary care patient of Cox, Kirsten, MD. I reached out to [name] by phone today in response to a referral sent by [name]'s primary care provider.  Third unsuccessful telephone outreach was attempted today. The patient was referred to the case management team for assistance with care management and care coordination. The patient's primary care provider has been notified of our unsuccessful attempts to make or maintain contact with the patient. The care management team is pleased to engage with this patient at any time in the future should he/she be interested in assistance from the care management team.   Follow Up Plan: We have been unable to make contact with the patient. The care management team is available to follow up with the patient after provider conversation with the patient regarding recommendation for care management engagement and subsequent re-referral to the care management team.  A HIPAA compliant phone message was left for the patient providing contact information and requesting a return call.   [place] Management  Direct Dial: [phone]

## 2020-12-02 ENCOUNTER — Telehealth: Payer: Self-pay | Admitting: Podiatry

## 2020-12-02 NOTE — Telephone Encounter (Signed)
Pt left message stating she needed to schedule an appt for our [place] office and if we could call her back.  I returned call and left message for pt to call back to schedule an appt (pt wants an appt in [place] office.)

## 2020-12-09 ENCOUNTER — Ambulatory Visit (INDEPENDENT_AMBULATORY_CARE_PROVIDER_SITE_OTHER): Payer: Medicare HMO | Admitting: Podiatry

## 2020-12-09 ENCOUNTER — Other Ambulatory Visit: Payer: Self-pay

## 2020-12-09 DIAGNOSIS — L84 Corns and callosities: Secondary | ICD-10-CM | POA: Diagnosis not present

## 2020-12-09 DIAGNOSIS — E1151 Type 2 diabetes mellitus with diabetic peripheral angiopathy without gangrene: Secondary | ICD-10-CM

## 2020-12-09 NOTE — Progress Notes (Signed)
  Subjective:  Patient ID: Amy Mccann, female    DOB: 02-09-1941,  MRN: [id number]  Chief Complaint  Patient presents with   Callouses    F/U Rt 3rd toe corn trimming    80 y.o. female presents with the above complaint. History confirmed with patient.    Objective:  Physical Exam: warm, good capillary refill, nail exam normal nails without lesions, no trophic changes or ulcerative lesions. DP pulses palpable and protective sensation intact. HAV deformity bilat. Non-palpable PT pulses. DP pulses palpable. Hammertoes, rigid bilat. HPKs right 3rd toe latera PIPJ, left hallux IPJ medially  No images are attached to the encounter.  Assessment:   1. Diabetes mellitus type 2 with peripheral artery disease (HCC)   2. Callus    Plan:  Patient was evaluated and treated and all questions answered.  Bunion, Hammertoe, Diabetes, and PAD -Calluses debrided x2. Medically necessary given DM with risk factors  Procedure: Paring of Lesion Rationale: painful hyperkeratotic lesion Type of Debridement: manual, sharp debridement. Instrumentation: 312 blade Number of Lesions: 2   Return if symptoms worsen or fail to improve.

## 2021-01-01 NOTE — Progress Notes (Signed)
Subjective:  Patient ID: Amy Mccann, female    DOB: 1940-12-05  Age: 80 y.o. MRN: [id number]  Chief Complaint  Patient presents with   Diabetes   Hyperlipidemia   Hypertension    Diabetes:  Complications: Glucose checking: qd Glucose logs: 140-160 Hypoglycemia:no Most recent A1C: 6.4 Current medications: none Last Eye Exam: due Foot checks: yes  Hyperlipidemia: Current medications: Simvastatin 5 mg once daily  Hypertension: Complications: Current medications: Losartan 25 mg once daily.  Hypothyroidism: Currently on Synthroid 112 mcg once daily in AM. GERD: Currently on Nexium 40 mg once daily. Osteoarthritis of fingers: Patient has been taking ibuprofen 600 mg 3 times a day as needed as well as sometimes takes Tylenol.  Uses magnetic to gloves.  Is also been taking OTC turmeric and wanted to check if this was okay. Patient has also had back issues.  She is scheduled to see [place] neurosurgery on December 14.  We had given her an injection last few months ago which helped for less than 2 weeks but was great while it helped.  Constipation: Currently on MiraLAX and Metamucil.  This helps.  Also taking a probiotic.  Incidentally she also takes cherry extract unit will help her joint pain.  Processed/Vitamin D deficiency:.  Patient was taking vitamin D 50,000 daily weekly however it made her very sleepy.  She completed the course and did not ask for refill.  She is taking OTC vitamin D approximately 2000 units daily as well as calcium with vitamin D 1200 mg daily.  Diet: Fairly healthy.  Exercise: Walking.   Current Outpatient Medications on File Prior to Visit  Medication Sig Dispense Refill   Accu-Chek Softclix Lancets lancets Use as instructed 100 each 5   ascorbic acid (VITAMIN C) 500 MG tablet Take by mouth.     esomeprazole (NEXIUM) 40 MG capsule Take 40 mg by mouth daily.     Ginger, Zingiber officinalis, (GINGER ROOT PO) Take by mouth.     glucose blood  (ACCU-CHEK AVIVA PLUS) test strip Once daily fasting 100 strip 3   levothyroxine (SYNTHROID) 112 MCG tablet TAKE 1 TABLET(112 MCG) BY MOUTH DAILY BEFORE AND BREAKFAST 90 tablet 1   losartan (COZAAR) 25 MG tablet TAKE 1 TABLET BY MOUTH DAILY 90 tablet 1   ondansetron (ZOFRAN ODT) 8 MG disintegrating tablet Take 0.5 tablets (4 mg total) by mouth every 8 (eight) hours as needed for nausea or vomiting. 30 tablet 1   polyethylene glycol powder (GLYCOLAX/MIRALAX) 17 GM/SCOOP powder Take by mouth.     psyllium (METAMUCIL) 58.6 % packet Take by mouth.     rosuvastatin (CRESTOR) 5 MG tablet TAKE 1 TABLET(5 MG) BY MOUTH DAILY 90 tablet 1   triamcinolone cream (KENALOG) 0.1 %      vitamin E 180 MG (400 UNITS) capsule Take by mouth.     No current facility-administered medications on file prior to visit.   Past Medical History:  Diagnosis Date   Age-related osteoporosis without current pathological fracture 07/03/2019   Cancer [facility]) 1990   Left Breast Cancer    Cancer ([place]) 2017   Thyroid Cancer   Cervicalgia 07/03/2019   Diabetes mellitus without complication (HCC)    Generalized anxiety disorder    GERD (gastroesophageal reflux disease)    Hypertension    Low back pain with sciatica 07/03/2019   Obesity due to excess calories 07/03/2019   S/P repair of paraesophageal hernia 10/20/2013   S/P TKR (total knee replacement) 07/25/2012   Past  Surgical History:  Procedure Laterality Date   10 HOUR [place] STUDY N/A 08/07/2013   Procedure: 24 HOUR [place] STUDY;  Surgeon: Missy Sabins, MD;  Location: [facility];  Service: Endoscopy;  Laterality: N/A;   APPENDECTOMY  03/17/2016   ESOPHAGEAL MANOMETRY N/A 08/07/2013   Procedure: ESOPHAGEAL MANOMETRY (EM);  Surgeon: Missy Sabins, MD;  Location: [facility];  Service: Endoscopy;  Laterality: N/A;   JOINT REPLACEMENT     MASTECTOMY Left 1990   THYROIDECTOMY Bilateral 08/2015    History reviewed. No pertinent family history. Social History   Socioeconomic  History   Marital status: Divorced    Spouse name: Not on file   Number of children: Not on file   Years of education: Not on file   Highest education level: Not on file  Occupational History   Not on file  Tobacco Use   Smoking status: Never   Smokeless tobacco: Never  Vaping Use   Vaping Use: Never used  Substance and Sexual Activity   Alcohol use: Never   Drug use: Never   Sexual activity: Not on file  Other Topics Concern   Not on file  Social History Narrative   Not on file   Social Determinants of Health   Financial Resource Strain: Not on file  Food Insecurity: Not on file  Transportation Needs: Not on file  Physical Activity: Not on file  Stress: Not on file  Social Connections: Not on file    Review of Systems  Constitutional:  Negative for appetite change, fatigue and fever.  HENT:  Negative for congestion, ear pain, sinus pressure and sore throat.   Eyes:  Negative for pain.  Respiratory:  Negative for cough, chest tightness, shortness of breath and wheezing.   Cardiovascular:  Negative for chest pain and palpitations.  Gastrointestinal:  Negative for abdominal pain, constipation, diarrhea, nausea and vomiting.  Genitourinary:  Negative for dysuria and hematuria.  Musculoskeletal:  Positive for back pain (Low/Mid back pain). Negative for arthralgias, joint swelling and myalgias.  Skin:  Negative for rash.  Neurological:  Negative for dizziness, weakness and headaches.  Psychiatric/Behavioral:  Negative for dysphoric mood. The patient is not nervous/anxious.     Objective:  BP (!) 146/82 (BP Location: Right Arm, Patient Position: Sitting)   Pulse 71   Temp (!) 97.4 F (36.3 C) (Temporal)   Ht 5' (1.524 m)   Wt 165 lb (74.8 kg)   SpO2 96%   BMI 32.22 kg/m   BP/Weight 01/06/2021 10/28/2020 1/95/0932  Systolic BP 671 245 809  Diastolic BP 82 60 71  Wt. (Lbs) 165 162 165.2  BMI 32.22 31.64 32.26    Physical Exam Vitals reviewed.  Constitutional:       Appearance: Normal appearance. She is normal weight.  Neck:     Vascular: No carotid bruit.  Cardiovascular:     Rate and Rhythm: Normal rate and regular rhythm.     Pulses: Normal pulses.     Heart sounds: Normal heart sounds.  Pulmonary:     Effort: Pulmonary effort is normal. No respiratory distress.     Breath sounds: Normal breath sounds.  Abdominal:     General: Abdomen is flat. Bowel sounds are normal.     Palpations: Abdomen is soft.     Tenderness: There is no abdominal tenderness.  Musculoskeletal:        General: Tenderness (lUMBAR SPINE LEFT > RIGHT. BOUCHARDS AND HEBERDENS.) present.  Neurological:     Mental Status:  She is alert and oriented to person, place, and time.  Psychiatric:        Mood and Affect: Mood normal.        Behavior: Behavior normal.    Lab Results  Component Value Date   WBC 8.0 09/02/2020   HGB 13.5 09/02/2020   HCT 39.1 09/02/2020   PLT 273 09/02/2020   GLUCOSE 115 (H) 09/02/2020   CHOL 181 09/02/2020   TRIG 145 09/02/2020   HDL 63 09/02/2020   LDLCALC 93 09/02/2020   ALT 19 09/02/2020   AST 19 09/02/2020   NA 137 09/02/2020   K 4.5 09/02/2020   CL 97 09/02/2020   CREATININE 1.02 (H) 09/02/2020   BUN 10 09/02/2020   CO2 25 09/02/2020   TSH 3.440 09/02/2020   INR 0.99 01/21/2010   HGBA1C 6.4 (H) 09/02/2020   MICROALBUR 30 04/05/2019      Assessment & Plan:   Problem List Items Addressed This Visit       Cardiovascular and Mediastinum   Hypertension associated with diabetes ([place]) - Primary    bp good at home.  Recommend return for nurse visit for bp check with her bp cuff from home.         Digestive   GERD without esophagitis    Well-controlled.  Continue Nexium 40 mg once daily.        Endocrine   Type 2 diabetes mellitus with polyneuropathy (HCC)    Control: good Recommend check sugars fasting daily. Recommend check feet daily. Recommend annual eye exams. Medicines: none. Continue to work on eating a  healthy diet and exercise.  Labs drawn today.         Relevant Orders   Hemoglobin A1c   Postoperative hypothyroidism    The current medical regimen is effective;  continue present plan and medications.         Musculoskeletal and Integument   Osteopenia    Continue calcium with vitamin D. Check vitamin D level. Recheck bone density in 2024.      Primary osteoarthritis of both hands    Continue ibuprofen, tylenol.  May try turmeric.      Relevant Medications   ibuprofen (ADVIL) 600 MG tablet     Other   Mixed hyperlipidemia    Well controlled.  No changes to medicines.  Continue to work on eating a healthy diet and exercise.  Labs drawn today.        Class 1 obesity due to excess calories with serious comorbidity and body mass index (BMI) of 32.0 to 32.9 in adult    Recommend continue to work on eating healthy diet and exercise.       Chronic midline low back pain without sciatica    Keep appt with neurosurgery coming up.  Refill ibuprofen.       Relevant Medications   ibuprofen (ADVIL) 600 MG tablet  .  Meds ordered this encounter  Medications   ibuprofen (ADVIL) 600 MG tablet    Sig: Take 1 tablet (600 mg total) by mouth every 8 (eight) hours as needed.    Dispense:  90 tablet    Refill:  1     Orders Placed This Encounter  Procedures   CBC with Differential/Platelet   Comprehensive metabolic panel   Hemoglobin A1c      Follow-up: Return in about 4 months (around 05/06/2021) for chronic follow up.  An After Visit Summary was printed and given to the patient.  I,Lauren M Auman,acting as a scribe for Rochel Brome, MD.,have documented all relevant documentation on the behalf of Rochel Brome, MD,as directed by  Rochel Brome, MD while in the presence of Rochel Brome, MD.   Rochel Brome, MD [place] [phone]

## 2021-01-04 ENCOUNTER — Other Ambulatory Visit: Payer: Self-pay | Admitting: Family Medicine

## 2021-01-06 ENCOUNTER — Encounter: Payer: Self-pay | Admitting: Family Medicine

## 2021-01-06 ENCOUNTER — Ambulatory Visit (INDEPENDENT_AMBULATORY_CARE_PROVIDER_SITE_OTHER): Payer: Medicare HMO | Admitting: Family Medicine

## 2021-01-06 VITALS — BP 146/82 | HR 71 | Temp 97.4°F | Ht 60.0 in | Wt 165.0 lb

## 2021-01-06 DIAGNOSIS — I10 Essential (primary) hypertension: Secondary | ICD-10-CM | POA: Diagnosis not present

## 2021-01-06 DIAGNOSIS — E890 Postprocedural hypothyroidism: Secondary | ICD-10-CM | POA: Diagnosis not present

## 2021-01-06 DIAGNOSIS — M19042 Primary osteoarthritis, left hand: Secondary | ICD-10-CM

## 2021-01-06 DIAGNOSIS — M545 Low back pain, unspecified: Secondary | ICD-10-CM

## 2021-01-06 DIAGNOSIS — M8588 Other specified disorders of bone density and structure, other site: Secondary | ICD-10-CM | POA: Diagnosis not present

## 2021-01-06 DIAGNOSIS — G8929 Other chronic pain: Secondary | ICD-10-CM

## 2021-01-06 DIAGNOSIS — E782 Mixed hyperlipidemia: Secondary | ICD-10-CM

## 2021-01-06 DIAGNOSIS — E1159 Type 2 diabetes mellitus with other circulatory complications: Secondary | ICD-10-CM

## 2021-01-06 DIAGNOSIS — E1142 Type 2 diabetes mellitus with diabetic polyneuropathy: Secondary | ICD-10-CM | POA: Diagnosis not present

## 2021-01-06 DIAGNOSIS — E6609 Other obesity due to excess calories: Secondary | ICD-10-CM | POA: Diagnosis not present

## 2021-01-06 DIAGNOSIS — I152 Hypertension secondary to endocrine disorders: Secondary | ICD-10-CM

## 2021-01-06 DIAGNOSIS — K219 Gastro-esophageal reflux disease without esophagitis: Secondary | ICD-10-CM

## 2021-01-06 DIAGNOSIS — M19041 Primary osteoarthritis, right hand: Secondary | ICD-10-CM | POA: Diagnosis not present

## 2021-01-06 DIAGNOSIS — Z6832 Body mass index (BMI) 32.0-32.9, adult: Secondary | ICD-10-CM

## 2021-01-06 LAB — CBC WITH DIFFERENTIAL/PLATELET
Basophils Absolute: 0.1 10*3/uL (ref 0.0–0.2)
Basos: 1 %
EOS (ABSOLUTE): 0.1 10*3/uL (ref 0.0–0.4)
Eos: 1 %
Hematocrit: 39.8 % (ref 34.0–46.6)
Hemoglobin: 13.4 g/dL (ref 11.1–15.9)
Immature Grans (Abs): 0 10*3/uL (ref 0.0–0.1)
Immature Granulocytes: 0 %
Lymphocytes Absolute: 1.7 10*3/uL (ref 0.7–3.1)
Lymphs: 19 %
MCH: 32.2 pg (ref 26.6–33.0)
MCHC: 33.7 g/dL (ref 31.5–35.7)
MCV: 96 fL (ref 79–97)
Monocytes Absolute: 0.7 10*3/uL (ref 0.1–0.9)
Monocytes: 8 %
Neutrophils Absolute: 6.5 10*3/uL (ref 1.4–7.0)
Neutrophils: 71 %
Platelets: 266 10*3/uL (ref 150–450)
RBC: 4.16 x10E6/uL (ref 3.77–5.28)
RDW: 12.5 % (ref 11.7–15.4)
WBC: 9.1 10*3/uL (ref 3.4–10.8)

## 2021-01-06 LAB — COMPREHENSIVE METABOLIC PANEL
ALT: 15 IU/L (ref 0–32)
AST: 15 IU/L (ref 0–40)
Albumin/Globulin Ratio: 1.8 (ref 1.2–2.2)
Albumin: 4.4 g/dL (ref 3.7–4.7)
Alkaline Phosphatase: 101 IU/L (ref 44–121)
BUN/Creatinine Ratio: 15 (ref 12–28)
BUN: 15 mg/dL (ref 8–27)
Bilirubin Total: 0.4 mg/dL (ref 0.0–1.2)
CO2: 21 mmol/L (ref 20–29)
Calcium: 9.3 mg/dL (ref 8.7–10.3)
Chloride: 101 mmol/L (ref 96–106)
Creatinine, Ser: 0.98 mg/dL (ref 0.57–1.00)
Globulin, Total: 2.4 g/dL (ref 1.5–4.5)
Glucose: 116 mg/dL — ABNORMAL HIGH (ref 70–99)
Potassium: 4.2 mmol/L (ref 3.5–5.2)
Sodium: 136 mmol/L (ref 134–144)
Total Protein: 6.8 g/dL (ref 6.0–8.5)
eGFR: 58 mL/min/{1.73_m2} — ABNORMAL LOW (ref >59)

## 2021-01-06 LAB — HEMOGLOBIN A1C
Est. average glucose Bld gHb Est-mCnc: 134 mg/dL
Hgb A1c MFr Bld: 6.3 % — ABNORMAL HIGH (ref 4.8–5.6)

## 2021-01-06 MED ORDER — IBUPROFEN 600 MG PO TABS: 600.0000 mg | ORAL_TABLET | Freq: Three times a day (TID) | ORAL | 1 refills | Status: DC | PRN

## 2021-01-06 NOTE — Assessment & Plan Note (Signed)
Recommend continue to work on eating healthy diet and exercise.  

## 2021-01-06 NOTE — Assessment & Plan Note (Signed)
bp good at home.  Recommend return for nurse visit for bp check with her bp cuff from home.

## 2021-01-06 NOTE — Assessment & Plan Note (Signed)
The current medical regimen is effective;  continue present plan and medications.  

## 2021-01-06 NOTE — Assessment & Plan Note (Signed)
Continue ibuprofen, tylenol.  May try turmeric.

## 2021-01-06 NOTE — Assessment & Plan Note (Signed)
Keep appt with neurosurgery coming up.  Refill ibuprofen.

## 2021-01-06 NOTE — Assessment & Plan Note (Signed)
Well-controlled.  Continue Nexium 40 mg once daily.

## 2021-01-06 NOTE — Assessment & Plan Note (Signed)
Well controlled.  ?No changes to medicines.  ?Continue to work on eating a healthy diet and exercise.  ?Labs drawn today.  ?

## 2021-01-06 NOTE — Assessment & Plan Note (Signed)
Control: good Recommend check sugars fasting daily. Recommend check feet daily. Recommend annual eye exams. Medicines: none Continue to work on eating a healthy diet and exercise.  Labs drawn today.    

## 2021-01-06 NOTE — Assessment & Plan Note (Signed)
Continue calcium with vitamin D. Check vitamin D level. Recheck bone density in 2024.

## 2021-01-20 ENCOUNTER — Encounter: Payer: Self-pay | Admitting: Nurse Practitioner

## 2021-01-20 ENCOUNTER — Ambulatory Visit (INDEPENDENT_AMBULATORY_CARE_PROVIDER_SITE_OTHER): Payer: Medicare HMO | Admitting: Nurse Practitioner

## 2021-01-20 ENCOUNTER — Other Ambulatory Visit: Payer: Self-pay

## 2021-01-20 VITALS — BP 124/68 | HR 77 | Temp 96.8°F | Ht 59.0 in | Wt 164.0 lb

## 2021-01-20 DIAGNOSIS — R197 Diarrhea, unspecified: Secondary | ICD-10-CM

## 2021-01-20 DIAGNOSIS — R1010 Upper abdominal pain, unspecified: Secondary | ICD-10-CM

## 2021-01-20 MED ORDER — DICYCLOMINE HCL 10 MG PO CAPS: 10.0000 mg | ORAL_CAPSULE | Freq: Three times a day (TID) | ORAL | 2 refills | Status: DC

## 2021-01-20 NOTE — Progress Notes (Signed)
Acute Office Visit  Subjective:    Patient ID: Amy Mccann, female    DOB: 03/10/1940, 80 y.o.   MRN: [id number]  Chief Complaint  Patient presents with   Diarrhea    HPI: Patient is in today for Abdominal Pain  She reports new onset abdominal pain. The most recent episode started a few days ago and is gradually improving. The abdominal pain is located in the epigastrium and does not radiate. It is described as none, is mild in intensity and she has been eating soup for the past 3 days.Treatment has included Pepto Bismol and anti-diarrheal OTC medication. Zaia tells me that she take Miralax and Metamucil daily to prevent constipation.    Associated symptoms: No anorexia  No belching  No bloody stool No blood in urine   No constipation Yes diarrhea  No dysuria No fever  No flatus No headaches  No headaches Yes joint pains  No myalgias Yes nausea  No vomiting No weight loss      Previous labs Lab Results  Component Value Date   WBC 9.1 01/06/2021   HGB 13.4 01/06/2021   HCT 39.8 01/06/2021   MCV 96 01/06/2021   MCH 32.2 01/06/2021   RDW 12.5 01/06/2021   PLT 266 01/06/2021   Lab Results  Component Value Date   GLUCOSE 116 (H) 01/06/2021   NA 136 01/06/2021   K 4.2 01/06/2021   CL 101 01/06/2021   CO2 21 01/06/2021   BUN 15 01/06/2021   CREATININE 0.98 01/06/2021   GFRNONAA 61 03/05/2020   GFRAA 70 03/05/2020   CALCIUM 9.3 01/06/2021   PROT 6.8 01/06/2021   ALBUMIN 4.4 01/06/2021   LABGLOB 2.4 01/06/2021   AGRATIO 1.8 01/06/2021   BILITOT 0.4 01/06/2021   ALKPHOS 101 01/06/2021   AST 15 01/06/2021   ALT 15 01/06/2021     Past Medical History:  Diagnosis Date   Age-related osteoporosis without current pathological fracture 07/03/2019   Cancer ([place]) 1990   Left Breast Cancer    Cancer ([place]) 2017   Thyroid Cancer   Cervicalgia 07/03/2019   Diabetes mellitus without complication (HCC)    Generalized anxiety disorder    GERD (gastroesophageal  reflux disease)    Hypertension    Low back pain with sciatica 07/03/2019   Obesity due to excess calories 07/03/2019   S/P repair of paraesophageal hernia 10/20/2013   S/P TKR (total knee replacement) 07/25/2012    Past Surgical History:  Procedure Laterality Date   24 HOUR [place] STUDY N/A 08/07/2013   Procedure: 24 HOUR [place] STUDY;  Surgeon: Missy Sabins, MD;  [facility];  Service: Endoscopy;  Laterality: N/A;   APPENDECTOMY  03/17/2016   ESOPHAGEAL MANOMETRY N/A 08/07/2013   Procedure: ESOPHAGEAL MANOMETRY (EM);  Surgeon: Missy Sabins, MD;  [facility];  Service: Endoscopy;  Laterality: N/A;   JOINT REPLACEMENT     MASTECTOMY Left 1990   THYROIDECTOMY Bilateral 08/2015    No family history on file.  Social History   Socioeconomic History   Marital status: Divorced    Spouse name: Not on file   Number of children: Not on file   Years of education: Not on file   Highest education level: Not on file  Occupational History   Not on file  Tobacco Use   Smoking status: Never   Smokeless tobacco: Never  Vaping Use   Vaping Use: Never used  Substance and Sexual Activity   Alcohol use:  Never   Drug use: Never   Sexual activity: Not on file  Other Topics Concern   Not on file  Social History Narrative   Not on file   Social Determinants of Health   Financial Resource Strain: Not on file  Food Insecurity: Not on file  Transportation Needs: Not on file  Physical Activity: Not on file  Stress: Not on file  Social Connections: Not on file  Intimate Partner Violence: Not on file    Outpatient Medications Prior to Visit  Medication Sig Dispense Refill   Accu-Chek Softclix Lancets lancets Use as instructed 100 each 5   ascorbic acid (VITAMIN C) 500 MG tablet Take by mouth.     esomeprazole (NEXIUM) 40 MG capsule Take 40 mg by mouth daily.     Ginger, Zingiber officinalis, (GINGER ROOT PO) Take by mouth.     glucose blood (ACCU-CHEK AVIVA PLUS) test strip  Once daily fasting 100 strip 3   ibuprofen (ADVIL) 600 MG tablet Take 1 tablet (600 mg total) by mouth every 8 (eight) hours as needed. 90 tablet 1   levothyroxine (SYNTHROID) 112 MCG tablet TAKE 1 TABLET(112 MCG) BY MOUTH DAILY BEFORE AND BREAKFAST 90 tablet 1   losartan (COZAAR) 25 MG tablet TAKE 1 TABLET BY MOUTH DAILY 90 tablet 1   ondansetron (ZOFRAN ODT) 8 MG disintegrating tablet Take 0.5 tablets (4 mg total) by mouth every 8 (eight) hours as needed for nausea or vomiting. 30 tablet 1   polyethylene glycol powder (GLYCOLAX/MIRALAX) 17 GM/SCOOP powder Take by mouth.     psyllium (METAMUCIL) 58.6 % packet Take by mouth.     rosuvastatin (CRESTOR) 5 MG tablet TAKE 1 TABLET(5 MG) BY MOUTH DAILY 90 tablet 1   triamcinolone cream (KENALOG) 0.1 %      vitamin E 180 MG (400 UNITS) capsule Take by mouth.     No facility-administered medications prior to visit.    Allergies  Allergen Reactions   Ergocalciferol Other (See Comments)    somnolence   Codeine Itching and Rash   Penicillins Rash    Review of Systems  Constitutional:  Positive for fatigue. Negative for chills and fever.  HENT:  Negative for congestion, ear pain, postnasal drip, rhinorrhea, sinus pressure, sinus pain and sore throat.   Respiratory:  Positive for cough. Negative for shortness of breath.   Cardiovascular:  Negative for chest pain.  Gastrointestinal:  Positive for abdominal pain, diarrhea and nausea.  Musculoskeletal:  Positive for arthralgias (bilateral hands/fingers) and joint swelling (bilateral hands, fingers).  Neurological:  Negative for dizziness and headaches.      Objective:    Physical Exam Vitals reviewed.  Constitutional:      Appearance: Normal appearance.  Cardiovascular:     Rate and Rhythm: Normal rate and regular rhythm.     Pulses: Normal pulses.     Heart sounds: Normal heart sounds.  Pulmonary:     Effort: Pulmonary effort is normal.     Breath sounds: Normal breath sounds.   Abdominal:     General: Bowel sounds are normal.     Palpations: Abdomen is soft.  Musculoskeletal:        General: Swelling (bilateral fingers) and tenderness present.  Skin:    General: Skin is warm and dry.     Capillary Refill: Capillary refill takes less than 2 seconds.  Neurological:     General: No focal deficit present.     Mental Status: She is alert and oriented to person,  place, and time.  Psychiatric:        Mood and Affect: Mood normal.        Behavior: Behavior normal.   Ht _0  (1.499 m)   Wt 164 lb (74.4 kg)   BMI 33.12 kg/m  BP 124/68   Pulse 77   Temp (!) 96.8 F (36 C)   Ht _1  (1.499 m)   Wt 164 lb (74.4 kg)   SpO2 99%   BMI 33.12 kg/m   Wt Readings from Last 3 Encounters:  01/20/21 164 lb (74.4 kg)  01/06/21 165 lb (74.8 kg)  10/28/20 162 lb (73.5 kg)    Health Maintenance Due  Topic Date Due   Zoster Vaccines- Shingrix (1 of 2) Never done   COVID-19 Vaccine (4 - Booster for Pfizer series) 02/03/2020   OPHTHALMOLOGY EXAM  09/20/2020       Lab Results  Component Value Date   TSH 3.440 09/02/2020   Lab Results  Component Value Date   WBC 9.1 01/06/2021   HGB 13.4 01/06/2021   HCT 39.8 01/06/2021   MCV 96 01/06/2021   PLT 266 01/06/2021   Lab Results  Component Value Date   NA 136 01/06/2021   K 4.2 01/06/2021   CO2 21 01/06/2021   GLUCOSE 116 (H) 01/06/2021   BUN 15 01/06/2021   CREATININE 0.98 01/06/2021   BILITOT 0.4 01/06/2021   ALKPHOS 101 01/06/2021   AST 15 01/06/2021   ALT 15 01/06/2021   PROT 6.8 01/06/2021   ALBUMIN 4.4 01/06/2021   CALCIUM 9.3 01/06/2021   EGFR 58 (L) 01/06/2021   Lab Results  Component Value Date   CHOL 181 09/02/2020   Lab Results  Component Value Date   HDL 63 09/02/2020   Lab Results  Component Value Date   LDLCALC 93 09/02/2020   Lab Results  Component Value Date   TRIG 145 09/02/2020   Lab Results  Component Value Date   CHOLHDL 2.9 09/02/2020   Lab Results   Component Value Date   HGBA1C 6.3 (H) 01/06/2021       Assessment & Plan:     1. Acute diarrhea - dicyclomine (BENTYL) 10 MG capsule; Take 1 capsule (10 mg total) by mouth 3 (three) times daily before meals.  Dispense: 120 capsule; Refill: 2  2. Pain of upper abdomen - dicyclomine (BENTYL) 10 MG capsule; Take 1 capsule (10 mg total) by mouth 3 (three) times daily before meals.  Dispense: 120 capsule; Refill: 2   Hold Miralax and Metamucil daily for constipation until diarrhea subsides Rest and push fluids Take Bentyl three times daily for abdominal pain Follow-up as needed    Follow-up: PRN  An After Visit Summary was printed and given to the patient.  I, Rip Harbour, NP, have reviewed all documentation for this visit. The documentation on 01/20/21 for the exam, diagnosis, procedures, and orders are all accurate and complete.    Signed,  Rip Harbour, NP [place] [phone]

## 2021-01-20 NOTE — Patient Instructions (Signed)
Rest and push fluids Take Bentyl three times daily for abdominal pain Follow-up as needed   Diarrhea, Adult Diarrhea is when you pass loose and watery poop (stool) often. Diarrhea can make you feel weak and cause you to lose water in your body (get dehydrated). Losing water in your body can cause you to: Feel tired and thirsty. Have a dry mouth. Go pee (urinate) less often. Diarrhea often lasts 2-3 days. However, it can last longer if it is a sign of something more serious. It is important to treat your diarrhea as told by your doctor. Follow these instructions at home: Eating and drinking   Follow these instructions as told by your doctor: Take an ORS (oral rehydration solution). This is a drink that helps you replace fluids and minerals your body lost. It is sold at pharmacies and stores. Drink plenty of fluids, such as: Water. Ice chips. Diluted fruit juice. Low-calorie sports drinks. Milk, if you want. Avoid drinking fluids that have a lot of sugar or caffeine in them. Eat bland, easy-to-digest foods in small amounts as you are able. These foods include: Bananas. Applesauce. Rice. Low-fat (lean) meats. Toast. Crackers. Avoid alcohol. Avoid spicy or fatty foods.  Medicines Take over-the-counter and prescription medicines only as told by your doctor. If you were prescribed an antibiotic medicine, take it as told by your doctor. Do not stop using the antibiotic even if you start to feel better. General instructions  Wash your hands often using soap and water. If soap and water are not available, use a hand sanitizer. Others in your home should wash their hands as well. Hands should be washed: After using the toilet or changing a diaper. Before preparing, cooking, or serving food. While caring for a sick person. While visiting someone in a hospital. Drink enough fluid to keep your pee (urine) pale yellow. Rest at home while you get better. Take a warm bath to help with  any burning or pain from having diarrhea. Watch your condition for any changes. Keep all follow-up visits as told by your doctor. This is important. Contact a doctor if: You have a fever. Your diarrhea gets worse. You have new symptoms. You cannot keep fluids down. You feel light-headed or dizzy. You have a headache. You have muscle cramps. Get help right away if: You have chest pain. You feel very weak or you pass out (faint). You have bloody or black poop or poop that looks like tar. You have very bad pain, cramping, or bloating in your belly (abdomen). You have trouble breathing or you are breathing very quickly. Your heart is beating very quickly. Your skin feels cold and clammy. You feel confused. You have signs of losing too much water in your body, such as: Dark pee, very little pee, or no pee. Cracked lips. Dry mouth. Sunken eyes. Sleepiness. Weakness. Summary Diarrhea is when you pass loose and watery poop (stool) often. Diarrhea can make you feel weak and cause you to lose water in your body (get dehydrated). Take an ORS (oral rehydration solution). This is a drink that is sold at pharmacies and stores. Eat bland, easy-to-digest foods in small amounts as you are able. Contact a doctor if your condition gets worse. Get help right away if you have signs that you have lost too much water in your body. This information is not intended to replace advice given to you by your health care provider. Make sure you discuss any questions you have with your health care provider.  Document Revised: 08/07/2020 Document Reviewed: 08/07/2020 Elsevier Patient Education  [place] Choices to Help Relieve Diarrhea, Adult Diarrhea can make you feel weak and cause you to become dehydrated. It is important to choose the right foods and drinks to: Relieve diarrhea. Replace lost fluids and nutrients. Prevent dehydration. What are tips for following this plan? Relieving  diarrhea Avoid foods that make your diarrhea worse. These may include: Foods and beverages sweetened with high-fructose corn syrup, honey, or sweeteners such as xylitol, sorbitol, and mannitol. Fried, greasy, or spicy foods. Raw fruits and vegetables. Eat foods that are rich in probiotics. These include foods such as yogurt and fermented milk products. Probiotics can help increase healthy bacteria in your stomach and intestines (gastrointestinal tract or GI tract). This may help digestion and stop diarrhea. If you have lactose intolerance, avoid dairy products. These may make your diarrhea worse. Take medicine to help stop diarrhea only as told by your health care provider. Replacing nutrients  Eat bland, easy-to-digest foods in small amounts as you are able, until your diarrhea starts to get better. These foods include bananas, applesauce, rice, toast, and crackers. Gradually reintroduce nutrient-rich foods as tolerated or as told by your health care provider. This includes: Well-cooked protein foods, such as eggs, lean meats like fish or chicken without skin, and tofu. Peeled, seeded, and soft-cooked fruits and vegetables. Low-fat dairy products. Whole grains. Take vitamin and mineral supplements as told by your health care provider. Preventing dehydration  Start by sipping water or a solution to prevent dehydration (oral rehydration solution, ORS). This is a drink that helps replace fluids and minerals your body has lost. You can buy an ORS at pharmacies and retail stores. Try to drink at least 8-10 cups (2,000-2,500 mL) of fluid each day to help replace lost fluids. If you have urine that is pale yellow, you are getting enough fluids. You may drink other liquids in addition to water, such as fruit juice that you have added water to (diluted fruit juice) or low-calorie sports drinks, as tolerated or as told by your health care provider. Avoid drinks with caffeine, such as coffee, tea, or  soft drinks. Avoid alcohol. Summary When you have diarrhea, it is important to choose the right foods and drinks to relieve diarrhea, to replace lost fluids and nutrients, and to prevent dehydration. Make sure you drink enough fluid to keep your urine pale yellow. You may benefit from eating bland foods at first. Gradually reintroduce healthy, nutrient-rich foods as tolerated or as told by your health care provider. Avoid foods that make your diarrhea worse, such as fried, greasy, or spicy foods. This information is not intended to replace advice given to you by your health care provider. Make sure you discuss any questions you have with your health care provider. Document Revised: 03/14/2019 Document Reviewed: 03/14/2019 Elsevier Patient Education  2022 [employer].

## 2021-01-22 DIAGNOSIS — M1990 Unspecified osteoarthritis, unspecified site: Secondary | ICD-10-CM | POA: Diagnosis not present

## 2021-01-22 DIAGNOSIS — M47816 Spondylosis without myelopathy or radiculopathy, lumbar region: Secondary | ICD-10-CM | POA: Diagnosis not present

## 2021-01-28 DIAGNOSIS — M545 Low back pain, unspecified: Secondary | ICD-10-CM | POA: Diagnosis not present

## 2021-02-06 DIAGNOSIS — M4804 Spinal stenosis, thoracic region: Secondary | ICD-10-CM | POA: Diagnosis not present

## 2021-02-06 DIAGNOSIS — M545 Low back pain, unspecified: Secondary | ICD-10-CM | POA: Diagnosis not present

## 2021-02-06 DIAGNOSIS — M48061 Spinal stenosis, lumbar region without neurogenic claudication: Secondary | ICD-10-CM | POA: Diagnosis not present

## 2021-02-06 DIAGNOSIS — M5126 Other intervertebral disc displacement, lumbar region: Secondary | ICD-10-CM | POA: Diagnosis not present

## 2021-02-19 ENCOUNTER — Ambulatory Visit (INDEPENDENT_AMBULATORY_CARE_PROVIDER_SITE_OTHER): Payer: Medicare HMO

## 2021-02-19 ENCOUNTER — Other Ambulatory Visit: Payer: Self-pay

## 2021-02-19 VITALS — BP 128/60 | HR 68 | Resp 20 | Ht 59.0 in | Wt 159.6 lb

## 2021-02-19 DIAGNOSIS — Z23 Encounter for immunization: Secondary | ICD-10-CM | POA: Diagnosis not present

## 2021-02-19 DIAGNOSIS — Z0000 Encounter for general adult medical examination without abnormal findings: Secondary | ICD-10-CM | POA: Diagnosis not present

## 2021-02-19 NOTE — Progress Notes (Signed)
Subjective:   Amy Mccann is a 81 y.o. female who presents for Medicare Annual (Subsequent) preventive examination.  This wellness visit is conducted by a nurse.  The patient's medications were reviewed and reconciled since the patient's last visit.  History details were provided by the patient.  The history appears to be reliable.    Patient's last AWV was more than one year ago.   Medical History: Patient history and Family history was reviewed  Medications, Allergies, and preventative health maintenance was reviewed and updated.   Review of Systems    ROS-Negative Cardiac Risk Factors include: advanced age (>42men, >64 women);diabetes mellitus;obesity (BMI >30kg/m2)     Objective:    Today's Vitals   02/19/21 0900  BP: 128/60  Pulse: 68  Resp: 20  Weight: 159 lb 9.6 oz (72.4 kg)  Height: 4\' 11"  (1.499 m)  PainSc: 0-No pain   Body mass index is 32.24 kg/m.  Advanced Directives 02/19/2021 07/26/2019  Does Patient Have a Medical Advance Directive? Yes Yes  Type of Advance Directive Living will Living will  Does patient want to make changes to medical advance directive? No - Patient declined No - Patient declined    Current Medications (verified) Outpatient Encounter Medications as of 02/19/2021  Medication Sig   Accu-Chek Softclix Lancets lancets Use as instructed   ascorbic acid (VITAMIN C) 500 MG tablet Take by mouth.   dicyclomine (BENTYL) 10 MG capsule Take 1 capsule (10 mg total) by mouth 3 (three) times daily before meals.   esomeprazole (NEXIUM) 40 MG capsule Take 40 mg by mouth daily.   Ginger, Zingiber officinalis, (GINGER ROOT PO) Take by mouth.   glucose blood (ACCU-CHEK AVIVA PLUS) test strip Once daily fasting   ibuprofen (ADVIL) 600 MG tablet Take 1 tablet (600 mg total) by mouth every 8 (eight) hours as needed.   levothyroxine (SYNTHROID) 112 MCG tablet TAKE 1 TABLET(112 MCG) BY MOUTH DAILY BEFORE AND BREAKFAST   losartan (COZAAR) 25 MG tablet TAKE 1  TABLET BY MOUTH DAILY   ondansetron (ZOFRAN ODT) 8 MG disintegrating tablet Take 0.5 tablets (4 mg total) by mouth every 8 (eight) hours as needed for nausea or vomiting.   polyethylene glycol powder (GLYCOLAX/MIRALAX) 17 GM/SCOOP powder Take by mouth.   psyllium (METAMUCIL) 58.6 % packet Take by mouth.   rosuvastatin (CRESTOR) 5 MG tablet TAKE 1 TABLET(5 MG) BY MOUTH DAILY   triamcinolone cream (KENALOG) 0.1 %    vitamin E 180 MG (400 UNITS) capsule Take by mouth.   No facility-administered encounter medications on file as of 02/19/2021.    Allergies (verified) Ergocalciferol, Codeine, and Penicillins   History: Past Medical History:  Diagnosis Date   Age-related osteoporosis without current pathological fracture 07/03/2019   Cancer ([place]) 1990   Left Breast Cancer    Cancer ([place]) 2017   Thyroid Cancer   Cervicalgia 07/03/2019   Diabetes mellitus without complication (HCC)    Generalized anxiety disorder    GERD (gastroesophageal reflux disease)    Hypertension    Low back pain with sciatica 07/03/2019   Obesity due to excess calories 07/03/2019   S/P repair of paraesophageal hernia 10/20/2013   S/P TKR (total knee replacement) 07/25/2012   Past Surgical History:  Procedure Laterality Date   24 HOUR [place] STUDY N/A 08/07/2013   Procedure: 24 HOUR [place] STUDY;  Surgeon: Missy Sabins, MD;  Location: [facility];  Service: Endoscopy;  Laterality: N/A;   APPENDECTOMY  03/17/2016   ESOPHAGEAL MANOMETRY  N/A 08/07/2013   Procedure: ESOPHAGEAL MANOMETRY (EM);  Surgeon: Missy Sabins, MD;  Location: [facility];  Service: Endoscopy;  Laterality: N/A;   JOINT REPLACEMENT     MASTECTOMY Left 1990   THYROIDECTOMY Bilateral 08/2015   Family History  Problem Relation Age of Onset   Leukemia Brother    Social History   Socioeconomic History   Marital status: Divorced    Spouse name: Not on file   Number of children: 0   Years of education: Not on file   Highest education level: Not on file   Occupational History   Occupation: Retired from [employer] Use   Smoking status: Never   Smokeless tobacco: Never  Scientific laboratory technician Use: Never used  Substance and Sexual Activity   Alcohol use: Never   Drug use: Never   Sexual activity: Not on file  Other Topics Concern   Not on file  Social History Narrative   Not on file   Social Determinants of Health   Financial Resource Strain: Not on file  Food Insecurity: No Food Insecurity   Worried About Charity fundraiser in the Last Year: Never true   [place] in the Last Year: Never true  Transportation Needs: No Transportation Needs   Lack of Transportation (Medical): No   Lack of Transportation (Non-Medical): No  Physical Activity: Insufficiently Active   Days of Exercise per Week: 5 days   Minutes of Exercise per Session: 20 min  Stress: No Stress Concern Present   Feeling of Stress : Not at all  Social Connections: Not on file    Tobacco Counseling Counseling given: Patient does not use tobacco products   Clinical Intake:  Pre-visit preparation completed: Yes Pain : No/denies pain Pain Score: 0-No pain   BMI - recorded: 32.24 Nutritional Status: BMI > 30  Obese Nutritional Risks: None Diabetes: Yes (last A1C 6.3) CBG done?: No Did pt. bring in CBG monitor from home?: No (reported avg 130's-150) How often do you need to have someone help you when you read instructions, pamphlets, or other written materials from your doctor or pharmacy?: 1 - Never Interpreter Needed?: No   Activities of Daily Living In your present state of health, do you have any difficulty performing the following activities: 02/19/2021 09/02/2020  Hearing? N N  Vision? N N  Difficulty concentrating or making decisions? N N  Walking or climbing stairs? Y N  Dressing or bathing? N N  Doing errands, shopping? N N  Preparing Food and eating ? N -  Using the Toilet? N -  In the past six months, have you accidently leaked urine? N  -  Do you have problems with loss of bowel control? N -  Managing your Medications? N -  Managing your Finances? N -  Housekeeping or managing your Housekeeping? N -  Some recent data might be hidden    Patient Care Team: Rochel Brome, MD as PCP - General (Family Medicine) Eustace Moore, MD as Consulting Physician (Neurosurgery)     Assessment:   This is a routine wellness examination for Lamara.  Hearing/Vision screen No results found.  Dietary issues and exercise activities discussed: Current Exercise Habits: Home exercise routine, Type of exercise: stretching, Time (Minutes): 20, Frequency (Times/Week): 5, Weekly Exercise (Minutes/Week): 100, Intensity: Mild   Goals Addressed             This Visit's Progress    Obtain Annual Eye (retinal)  Exam    Not on track    Patient will call to make an appointment with Dr Gilford Rile       Depression Screen PHQ 2/9 Scores 02/19/2021 03/06/2020 11/03/2019 07/26/2019 07/04/2019  PHQ - 2 Score 0 0 0 0 0  PHQ- 9 Score - 1 0 - -    Fall Risk Fall Risk  02/19/2021 01/20/2021 09/02/2020 07/04/2019 10/10/2015  Falls in the past year? 0 0 0 0 No  Comment - - - - [place] Telephone Survey: data to providers prior to load  Number falls in past yr: 0 0 0 0 -  Injury with Fall? 0 0 0 0 -  Risk for fall due to : Impaired balance/gait Impaired balance/gait - - -  Follow up Falls evaluation completed;Falls prevention discussed Falls evaluation completed Falls evaluation completed - -    FALL RISK PREVENTION PERTAINING TO THE HOME:  Any stairs in or around the home? No  If so, are there any without handrails? No  Home free of loose throw rugs in walkways, pet beds, electrical cords, etc? No  Adequate lighting in your home to reduce risk of falls? Yes   ASSISTIVE DEVICES UTILIZED TO PREVENT FALLS:  Use of a cane, walker or w/c? Yes  Grab bars in the bathroom? Yes  Shower chair or bench in shower? Yes  Elevated toilet seat or a handicapped toilet?  Yes   Gait slow and steady with assistive device  Cognitive Function:     6CIT Screen 02/19/2021 07/26/2019  What Year? 0 points 0 points  What month? 0 points 0 points  What time? 0 points 0 points  Count back from 20 0 points 2 points  Months in reverse 4 points 2 points  Repeat phrase 2 points 2 points  Total Score 6 6    Immunizations Immunization History  Administered Date(s) Administered   Fluad Quad(high Dose 65+) 11/03/2019, 10/28/2020   PFIZER(Purple Top)SARS-COV-2 Vaccination 04/07/2019, 05/05/2019, 12/09/2019, 07/07/2020   Pneumococcal Conjugate-13 03/17/2016   Pneumococcal Polysaccharide-23 08/28/2013   Tdap 11/04/2016   Zoster, Live 05/18/2014    TDAP status: Up to date  Flu Vaccine status: Up to date  Pneumococcal vaccine status: Up to date  Covid-19 vaccine status: Completed vaccines  Qualifies for Shingles Vaccine? Yes   Zostavax completed No   Shingrix Completed?: No.    Education has been provided regarding the importance of this vaccine. Patient has been advised to call insurance company to determine out of pocket expense if they have not yet received this vaccine. Advised may also receive vaccine at local pharmacy or Health Dept. Verbalized acceptance and understanding.  Screening Tests Health Maintenance  Topic Date Due   Zoster Vaccines- Shingrix (1 of 2) Never done   COVID-19 Vaccine (5 - Booster for Pfizer series) 09/01/2020   OPHTHALMOLOGY EXAM  09/20/2020   FOOT EXAM  03/05/2021   HEMOGLOBIN A1C  07/06/2021   MAMMOGRAM  08/28/2021   DEXA SCAN  10/17/2022   TETANUS/TDAP  11/05/2026   Pneumonia Vaccine 51+ Years old  Completed   INFLUENZA VACCINE  Completed   HPV VACCINES  Aged Out    Health Maintenance  Health Maintenance Due  Topic Date Due   Zoster Vaccines- Shingrix (1 of 2) Never done   COVID-19 Vaccine (5 - Booster for Pfizer series) 09/01/2020   OPHTHALMOLOGY EXAM  09/20/2020    Colorectal cancer screening: No longer  required.   Mammogram status: Completed 08/2020. Repeat every year  Bone Density status:  Completed 10/2020. Results reflect: Bone density results: OSTEOPENIA. Repeat every 2 years.  Lung Cancer Screening: (Low Dose CT Chest recommended if Age 43-80 years, 30 pack-year currently smoking OR have quit w/in 15years.) does not qualify.   Additional Screening:  Vision Screening: Recommended annual ophthalmology exams for early detection of glaucoma and other disorders of the eye. Is the patient up to date with their annual eye exam?  No  Who is the provider or what is the name of the office in which the patient attends annual eye exams? Dr Gilford Rile  Dental Screening: Recommended annual dental exams for proper oral hygiene    Plan:    1- Schedule annual diabetic eye exam with Dr Gilford Rile 2- Shingrix Vaccine recommended from pharmacy 3- COVID Bivalent Booster given today - up to date 4- Mammogram - due after 08/28/20 5- Continue chair exercises at home and visiting the center for activity 6- Bring a copy of your living will to our office for your record  I have personally reviewed and noted the following in the patients chart:   Medical and social history Use of alcohol, tobacco or illicit drugs  Current medications and supplements including opioid prescriptions.  Functional ability and status Nutritional status Physical activity Advanced directives List of other physicians Hospitalizations, surgeries, and ER visits in previous 12 months Vitals Screenings to include cognitive, depression, and falls Referrals and appointments  In addition, I have reviewed and discussed with patient certain preventive protocols, quality metrics, and best practice recommendations. A written personalized care plan for preventive services as well as general preventive health recommendations were provided to patient.     Erie Noe, LPN   04/09/6008

## 2021-02-19 NOTE — Patient Instructions (Signed)

## 2021-02-20 ENCOUNTER — Other Ambulatory Visit: Payer: Self-pay | Admitting: Family Medicine

## 2021-02-20 DIAGNOSIS — M545 Low back pain, unspecified: Secondary | ICD-10-CM | POA: Diagnosis not present

## 2021-03-06 DIAGNOSIS — Z20828 Contact with and (suspected) exposure to other viral communicable diseases: Secondary | ICD-10-CM | POA: Diagnosis not present

## 2021-03-06 DIAGNOSIS — R051 Acute cough: Secondary | ICD-10-CM | POA: Diagnosis not present

## 2021-03-19 ENCOUNTER — Other Ambulatory Visit: Payer: Self-pay | Admitting: Family Medicine

## 2021-03-19 DIAGNOSIS — E1169 Type 2 diabetes mellitus with other specified complication: Secondary | ICD-10-CM

## 2021-03-19 DIAGNOSIS — M545 Low back pain, unspecified: Secondary | ICD-10-CM

## 2021-03-19 DIAGNOSIS — E785 Hyperlipidemia, unspecified: Secondary | ICD-10-CM

## 2021-04-03 DIAGNOSIS — C73 Malignant neoplasm of thyroid gland: Secondary | ICD-10-CM | POA: Diagnosis not present

## 2021-04-03 DIAGNOSIS — E890 Postprocedural hypothyroidism: Secondary | ICD-10-CM | POA: Diagnosis not present

## 2021-04-03 DIAGNOSIS — E871 Hypo-osmolality and hyponatremia: Secondary | ICD-10-CM | POA: Diagnosis not present

## 2021-04-15 DIAGNOSIS — R5381 Other malaise: Secondary | ICD-10-CM | POA: Diagnosis not present

## 2021-04-15 DIAGNOSIS — Z20828 Contact with and (suspected) exposure to other viral communicable diseases: Secondary | ICD-10-CM | POA: Diagnosis not present

## 2021-04-24 ENCOUNTER — Other Ambulatory Visit: Payer: Self-pay | Admitting: Nurse Practitioner

## 2021-04-24 ENCOUNTER — Ambulatory Visit (INDEPENDENT_AMBULATORY_CARE_PROVIDER_SITE_OTHER): Payer: Medicare HMO | Admitting: Nurse Practitioner

## 2021-04-24 ENCOUNTER — Encounter: Payer: Self-pay | Admitting: Nurse Practitioner

## 2021-04-24 VITALS — BP 122/64 | HR 77 | Temp 77.0°F | Ht 59.0 in | Wt 166.0 lb

## 2021-04-24 DIAGNOSIS — R110 Nausea: Secondary | ICD-10-CM | POA: Diagnosis not present

## 2021-04-24 DIAGNOSIS — R140 Abdominal distension (gaseous): Secondary | ICD-10-CM | POA: Diagnosis not present

## 2021-04-24 DIAGNOSIS — K529 Noninfective gastroenteritis and colitis, unspecified: Secondary | ICD-10-CM | POA: Diagnosis not present

## 2021-04-24 MED ORDER — PROCHLORPERAZINE MALEATE 10 MG PO TABS
10.0000 mg | ORAL_TABLET | Freq: Four times a day (QID) | ORAL | 0 refills | Status: DC | PRN
Start: 2021-04-24 — End: 2021-05-13

## 2021-04-24 MED ORDER — DICYCLOMINE HCL 10 MG PO CAPS: 10.0000 mg | ORAL_CAPSULE | Freq: Three times a day (TID) | ORAL | 2 refills | Status: DC

## 2021-04-24 NOTE — Progress Notes (Signed)
? ?Acute Office Visit ? ?Subjective:  ? ? Patient ID: Amy Mccann, female    DOB: September 14, 1940, 81 y.o.   MRN: [id number] ? ?CC: ?Diarrhea ? ? ?HPI: ?Patient is in today for nausea, diarrhea, and abdominal bloating for 1-week. Treatment has included Compazine PRN and Bentyl QD. She denies fever, chills, constipation, rectal bleeding, decreased appetite, or weight loss. She was seen for similar symptoms on 01/20/21. She reportedly was taking Miralax and Metamucil daily to prevent constipation. She was prescribed Bentyl 10 mg TID and recommended to hold Metamucil and Miralax until diarrhea subsided. States she has discontinued Miralax since that time and take Metamucil daily. She had EGD with Dr Lyda Jester on 09/29/19 that revealed small hiatal hernia, last colonoscopy unknown. She has history of laparoscopic paraesophageal hernia repair with mesh and gastropexy by Dr Abran Richard on 05/19/13.States she was told she no longer needed screening colonoscopies due to advanced age. EMR review reveals ED visit on 08/27/19 for abd pain, CT abd/ pelvis with contrast positive for mild diffuse hepatic steatosis and small uterine fibroids. Pt denies previous HIDA scan.  ? ? ?Past Medical History:  ?Diagnosis Date  ? Age-related osteoporosis without current pathological fracture 07/03/2019  ? Cancer [facility]) 1990  ? Left Breast Cancer   ? Cancer [facility]) 2017  ? Thyroid Cancer  ? Cervicalgia 07/03/2019  ? Diabetes mellitus without complication ([place])   ? Generalized anxiety disorder   ? GERD (gastroesophageal reflux disease)   ? Hypertension   ? Low back pain with sciatica 07/03/2019  ? Obesity due to excess calories 07/03/2019  ? S/P repair of paraesophageal hernia 10/20/2013  ? S/P TKR (total knee replacement) 07/25/2012  ? ? ?Past Surgical History:  ?Procedure Laterality Date  ? 58 HOUR [place] STUDY N/A 08/07/2013  ? Procedure: [place] STUDY;  Surgeon: Missy Sabins, MD;  [facility];  Service: Endoscopy;  Laterality: N/A;  ?  APPENDECTOMY  03/17/2016  ? ESOPHAGEAL MANOMETRY N/A 08/07/2013  ? Procedure: ESOPHAGEAL MANOMETRY (EM);  Surgeon: Missy Sabins, MD;  Location: [facility];  Service: Endoscopy;  Laterality: N/A;  ? JOINT REPLACEMENT    ? MASTECTOMY Left 1990  ? THYROIDECTOMY Bilateral 08/2015  ? ? ?Family History  ?Problem Relation Age of Onset  ? Leukemia Brother   ? ? ?Social History  ? ?Socioeconomic History  ? Marital status: Divorced  ?  Spouse name: Not on file  ? Number of children: 0  ? Years of education: Not on file  ? Highest education level: Not on file  ?Occupational History  ? Occupation: Retired from [employer]  ?Tobacco Use  ? Smoking status: Never  ? Smokeless tobacco: Never  ?Vaping Use  ? Vaping Use: Never used  ?Substance and Sexual Activity  ? Alcohol use: Never  ? Drug use: Never  ? Sexual activity: Not on file  ?Other Topics Concern  ? Not on file  ?Social History Narrative  ? Not on file  ? ?Social Determinants of Health  ? ?Financial Resource Strain: Not on file  ?Food Insecurity: No Food Insecurity  ? Worried About Charity fundraiser in the Last Year: Never true  ? Ran Out of Food in the Last Year: Never true  ?Transportation Needs: No Transportation Needs  ? Lack of Transportation (Medical): No  ? Lack of Transportation (Non-Medical): No  ?Physical Activity: Insufficiently Active  ? Days of Exercise per Week: 5 days  ? Minutes of Exercise per Session: 20 min  ?Stress:  No Stress Concern Present  ? Feeling of Stress : Not at all  ?Social Connections: Not on file  ?Intimate Partner Violence: Not on file  ? ? ?Outpatient Medications Prior to Visit  ?Medication Sig Dispense Refill  ? Accu-Chek Softclix Lancets lancets Use as instructed 100 each 5  ? ascorbic acid (VITAMIN C) 500 MG tablet Take by mouth.    ? dicyclomine (BENTYL) 10 MG capsule Take 1 capsule (10 mg total) by mouth 3 (three) times daily before meals. 120 capsule 2  ? esomeprazole (NEXIUM) 40 MG capsule Take 40 mg by mouth daily.    ? Ginger,  Zingiber officinalis, (GINGER ROOT PO) Take by mouth.    ? glucose blood (ACCU-CHEK AVIVA PLUS) test strip USE TO CHECK FASTING BLOOD SUGAR ONCE DAILY AS DIRECTED` 100 strip 3  ? ibuprofen (ADVIL) 600 MG tablet TAKE 1 TABLET(600 MG) BY MOUTH EVERY 8 HOURS AS NEEDED 90 tablet 1  ? levothyroxine (SYNTHROID) 112 MCG tablet TAKE 1 TABLET(112 MCG) BY MOUTH DAILY BEFORE AND BREAKFAST 90 tablet 1  ? losartan (COZAAR) 25 MG tablet TAKE 1 TABLET BY MOUTH DAILY 90 tablet 1  ? ondansetron (ZOFRAN ODT) 8 MG disintegrating tablet Take 0.5 tablets (4 mg total) by mouth every 8 (eight) hours as needed for nausea or vomiting. 30 tablet 1  ? polyethylene glycol powder (GLYCOLAX/MIRALAX) 17 GM/SCOOP powder Take by mouth.    ? psyllium (METAMUCIL) 58.6 % packet Take by mouth.    ? rosuvastatin (CRESTOR) 5 MG tablet TAKE 1 TABLET(5 MG) BY MOUTH DAILY 90 tablet 1  ? triamcinolone cream (KENALOG) 0.1 %     ? vitamin E 180 MG (400 UNITS) capsule Take by mouth.    ? ?No facility-administered medications prior to visit.  ? ? ?Allergies  ?Allergen Reactions  ? Ergocalciferol Other (See Comments)  ?  somnolence  ? Codeine Itching and Rash  ? Penicillins Rash  ? ? ?Review of Systems  ?Constitutional:  Negative for chills, fatigue and fever.  ?Cardiovascular:  Negative for chest pain.  ?Gastrointestinal:  Positive for abdominal distention ("bloating"), diarrhea and nausea. Negative for abdominal pain.  ?Neurological:  Negative for dizziness and headaches.  ?All other systems reviewed and are negative. ? ?   ?Objective:  ?  ?Physical Exam ?Vitals reviewed.  ?Constitutional:   ?   Appearance: Normal appearance.  ?Cardiovascular:  ?   Rate and Rhythm: Normal rate and regular rhythm.  ?   Pulses: Normal pulses.  ?   Heart sounds: Normal heart sounds.  ?Pulmonary:  ?   Effort: Pulmonary effort is normal.  ?   Breath sounds: Normal breath sounds.  ?Abdominal:  ?   General: Bowel sounds are normal.  ?   Palpations: Abdomen is soft.  ?   Tenderness:  There is no abdominal tenderness. There is no right CVA tenderness, left CVA tenderness, guarding or rebound.  ?Skin: ?   General: Skin is warm and dry.  ?   Capillary Refill: Capillary refill takes less than 2 seconds.  ?Neurological:  ?   General: No focal deficit present.  ?   Mental Status: She is alert and oriented to person, place, and time.  ?Psychiatric:     ?   Mood and Affect: Mood normal.     ?   Behavior: Behavior normal.  ? ? ?BP 122/64   Pulse 77   Temp (!) 77 ?F (25 ?C)   Ht 4' 11"  (1.499 m)   Wt 166 lb (  75.3 kg)   SpO2 98%   BMI 33.53 kg/m?   ?Wt Readings from Last 3 Encounters:  ?04/24/21 166 lb (75.3 kg)  ?02/19/21 159 lb 9.6 oz (72.4 kg)  ?01/20/21 164 lb (74.4 kg)  ? ? ?Health Maintenance Due  ?Topic Date Due  ? Zoster Vaccines- Shingrix (1 of 2) Never done  ? OPHTHALMOLOGY EXAM  09/20/2020  ? FOOT EXAM  03/05/2021  ? ? ? ? ? ?Lab Results  ?Component Value Date  ? TSH 3.440 09/02/2020  ? ?Lab Results  ?Component Value Date  ? WBC 9.1 01/06/2021  ? HGB 13.4 01/06/2021  ? HCT 39.8 01/06/2021  ? MCV 96 01/06/2021  ? PLT 266 01/06/2021  ? ?Lab Results  ?Component Value Date  ? NA 136 01/06/2021  ? K 4.2 01/06/2021  ? CO2 21 01/06/2021  ? GLUCOSE 116 (H) 01/06/2021  ? BUN 15 01/06/2021  ? CREATININE 0.98 01/06/2021  ? BILITOT 0.4 01/06/2021  ? ALKPHOS 101 01/06/2021  ? AST 15 01/06/2021  ? ALT 15 01/06/2021  ? PROT 6.8 01/06/2021  ? ALBUMIN 4.4 01/06/2021  ? CALCIUM 9.3 01/06/2021  ? EGFR 58 (L) 01/06/2021  ? ?Lab Results  ?Component Value Date  ? CHOL 181 09/02/2020  ? ?Lab Results  ?Component Value Date  ? HDL 63 09/02/2020  ? ?Lab Results  ?Component Value Date  ? [place] 93 09/02/2020  ? ?Lab Results  ?Component Value Date  ? TRIG 145 09/02/2020  ? ?Lab Results  ?Component Value Date  ? CHOLHDL 2.9 09/02/2020  ? ?Lab Results  ?Component Value Date  ? HGBA1C 6.3 (H) 01/06/2021  ? ? ?   ?Assessment & Plan:  ? ?1. Chronic diarrhea of unknown origin ?-Imodium as directed ? ?2. Abdominal  bloating ?-Bentyl 10 mg TID with meals ? ?3. Nausea ?- prochlorperazine (COMPAZINE) 10 MG tablet; Take 1 tablet (10 mg total) by mouth every 6 (six) hours as needed for nausea or vomiting.  Dispense: 30 tablet; Refill: 0 ?  ? ? ?T

## 2021-04-24 NOTE — Patient Instructions (Addendum)
Take Bentyl (Dicyclomine) three times daily with meals for abdominal pain ?Take Compazine 10 mg as needed for nausea or vomiting ?Take Imodium as needed for diarrhea ?Push fluids ?If no improvement then notify office, may order gallbladder ultrasound ? ? ?Nausea and Vomiting, Adult ?Nausea is feeling that you have an upset stomach and that you are about to vomit. Vomiting is when food in your stomach forcefully comes out of your mouth. Vomiting can make you feel weak. If you vomit, or if you are not able to drink enough fluids, you may not have enough water in your body (get dehydrated). If you do not have enough water in your body, you may: ?Feel tired. ?Feel thirsty. ?Have a dry mouth. ?Have cracked lips. ?Pee (urinate) less often. ?Older adults and people with other diseases or a weak body defense system (immune system) are at higher risk for not having enough water in the body. If you feel like you may vomit or you vomit, it is important to follow instructions from your doctor about how to take care of yourself. ?Follow these instructions at home: ?Watch your symptoms for any changes. Tell your doctor about them. ?Eating and drinking ?  ?Take an ORS (oral rehydration solution). This is a drink that is sold at pharmacies and stores. ?Drink clear fluids in small amounts as you are able, such as: ?Water. ?Ice chips. ?Fruit juice that has water added (diluted fruit juice). ?Low-calorie sports drinks. ?Eat bland, easy-to-digest foods in small amounts as you are able, such as: ?Bananas. ?Applesauce. ?Rice. ?Low-fat (lean) meats. ?Toast. ?Crackers. ?Avoid drinking fluids that have a lot of sugar or caffeine in them. This includes energy drinks, sports drinks, and soda. ?Avoid alcohol. ?Avoid spicy or fatty foods. ?General instructions ?Take over-the-counter and prescription medicines only as told by your doctor. ?Drink enough fluid to keep your pee (urine) pale yellow. ?Wash your hands often with soap and water for at  least 20 seconds. If you cannot use soap and water, use hand sanitizer. ?Make sure that everyone in your home washes their hands well and often. ?Rest at home until you feel better. ?Watch your condition for any changes. ?Take slow and deep breaths when you feel like you may vomit. ?Keep all follow-up visits. ?Contact a doctor if: ?Your symptoms get worse. ?You have new symptoms. ?You have a fever. ?You cannot drink fluids without vomiting. ?You feel like you may vomit for more than 2 days. ?You feel light-headed or dizzy. ?You have a headache. ?You have muscle cramps. ?You have a rash. ?You have pain while peeing. ?Get help right away if: ?You have pain in your chest, neck, arm, or jaw. ?You feel very weak or you faint. ?You vomit again and again. ?You have vomit that is bright red or looks like black coffee grounds. ?You have bloody or black poop (stools) or poop that looks like tar. ?You have a very bad headache, a stiff neck, or both. ?You have very bad pain, cramping, or bloating in your belly (abdomen). ?You have trouble breathing. ?You are breathing very quickly. ?Your heart is beating very quickly. ?Your skin feels cold and clammy. ?You feel confused. ?You have signs of losing too much water in your body, such as: ?Dark pee, very little pee, or no pee. ?Cracked lips. ?Dry mouth. ?Sunken eyes. ?Sleepiness. ?Weakness. ?These symptoms may be an emergency. Get help right away. Call 911. ?Do not wait to see if the symptoms will go away. ?Do not drive yourself to the  hospital. ?Summary ?Nausea is feeling that you have an upset stomach and that you are about to vomit. Vomiting is when food in your stomach comes out of your mouth. ?Follow instructions from your doctor about eating and drinking. ?Take over-the-counter and prescription medicines only as told by your doctor. ?Contact your doctor if your symptoms get worse or you have new symptoms. ?Keep all follow-up visits. ?This information is not intended to replace  advice given to you by your health care provider. Make sure you discuss any questions you have with your health care provider. ?Document Revised: 08/02/2020 Document Reviewed: 08/02/2020 ?Elsevier Patient Education ? [place]. ? ?Food Choices to Help Relieve Diarrhea, Adult ?Diarrhea can make you feel weak and cause you to become dehydrated. It is important to choose the right foods and drinks to: ?Relieve diarrhea. ?Replace lost fluids and nutrients. ?Prevent dehydration. ?What are tips for following this plan? ?Relieving diarrhea ?Avoid foods that make your diarrhea worse. These may include: ?Foods and beverages sweetened with high-fructose corn syrup, honey, or sweeteners such as xylitol, sorbitol, and mannitol. ?Fried, greasy, or spicy foods. ?Raw fruits and vegetables. ?Eat foods that are rich in probiotics. These include foods such as yogurt and fermented milk products. Probiotics can help increase healthy bacteria in your stomach and intestines (gastrointestinal tract or GI tract). This may help digestion and stop diarrhea. ?If you have lactose intolerance, avoid dairy products. These may make your diarrhea worse. ?Take medicine to help stop diarrhea only as told by your health care provider. ?Replacing nutrients ? ?Eat bland, easy-to-digest foods in small amounts as you are able, until your diarrhea starts to get better. These foods include bananas, applesauce, rice, toast, and crackers. ?Gradually reintroduce nutrient-rich foods as tolerated or as told by your health care provider. This includes: ?Well-cooked protein foods, such as eggs, lean meats like fish or chicken without skin, and tofu. ?Peeled, seeded, and soft-cooked fruits and vegetables. ?Low-fat dairy products. ?Whole grains. ?Take vitamin and mineral supplements as told by your health care provider. ?Preventing dehydration ? ?Start by sipping water or a solution to prevent dehydration (oral rehydration solution, ORS). This is a drink  that helps replace fluids and minerals your body has lost. You can buy an ORS at pharmacies and retail stores. ?Try to drink at least 8-10 cups (2,000-2,500 mL) of fluid each day to help replace lost fluids. If you have urine that is pale yellow, you are getting enough fluids. ?You may drink other liquids in addition to water, such as fruit juice that you have added water to (diluted fruit juice) or low-calorie sports drinks, as tolerated or as told by your health care provider. ?Avoid drinks with caffeine, such as coffee, tea, or soft drinks. ?Avoid alcohol. ?Summary ?When you have diarrhea, it is important to choose the right foods and drinks to relieve diarrhea, to replace lost fluids and nutrients, and to prevent dehydration. ?Make sure you drink enough fluid to keep your urine pale yellow. ?You may benefit from eating bland foods at first. Gradually reintroduce healthy, nutrient-rich foods as tolerated or as told by your health care provider. ?Avoid foods that make your diarrhea worse, such as fried, greasy, or spicy foods. ?This information is not intended to replace advice given to you by your health care provider. Make sure you discuss any questions you have with your health care provider. ?Document Revised: 03/14/2019 Document Reviewed: 03/14/2019 ?Elsevier Patient Education ? [place]. ? ?

## 2021-04-28 ENCOUNTER — Other Ambulatory Visit: Payer: Self-pay | Admitting: Nurse Practitioner

## 2021-04-28 DIAGNOSIS — R197 Diarrhea, unspecified: Secondary | ICD-10-CM

## 2021-04-28 DIAGNOSIS — R140 Abdominal distension (gaseous): Secondary | ICD-10-CM

## 2021-04-28 DIAGNOSIS — R110 Nausea: Secondary | ICD-10-CM

## 2021-04-29 ENCOUNTER — Telehealth: Payer: Self-pay | Admitting: Family Medicine

## 2021-04-29 NOTE — Telephone Encounter (Signed)
? ?  Amy Mccann has been scheduled for the following appointment: ? ?WHAT: GB/RUQ ULTRASOUND ?WHERE: [place] ?DATE: 05/03/21 ?TIME: 10:30 AM ARRIVAL TIME ? ?Patient has been made aware. ?NPO AFTER MIDNIGHT, [place] ED ?

## 2021-05-03 DIAGNOSIS — K760 Fatty (change of) liver, not elsewhere classified: Secondary | ICD-10-CM | POA: Diagnosis not present

## 2021-05-03 DIAGNOSIS — R197 Diarrhea, unspecified: Secondary | ICD-10-CM | POA: Diagnosis not present

## 2021-05-03 DIAGNOSIS — R109 Unspecified abdominal pain: Secondary | ICD-10-CM | POA: Diagnosis not present

## 2021-05-03 DIAGNOSIS — R140 Abdominal distension (gaseous): Secondary | ICD-10-CM | POA: Diagnosis not present

## 2021-05-03 DIAGNOSIS — R110 Nausea: Secondary | ICD-10-CM | POA: Diagnosis not present

## 2021-05-05 ENCOUNTER — Other Ambulatory Visit: Payer: Self-pay

## 2021-05-05 DIAGNOSIS — R110 Nausea: Secondary | ICD-10-CM

## 2021-05-05 DIAGNOSIS — R197 Diarrhea, unspecified: Secondary | ICD-10-CM

## 2021-05-05 DIAGNOSIS — R140 Abdominal distension (gaseous): Secondary | ICD-10-CM

## 2021-05-13 ENCOUNTER — Other Ambulatory Visit: Payer: Self-pay

## 2021-05-13 ENCOUNTER — Ambulatory Visit: Payer: Medicare HMO | Admitting: Family Medicine

## 2021-05-13 DIAGNOSIS — R110 Nausea: Secondary | ICD-10-CM

## 2021-05-13 MED ORDER — PROCHLORPERAZINE MALEATE 10 MG PO TABS: 10.0000 mg | ORAL_TABLET | Freq: Four times a day (QID) | ORAL | 0 refills | Status: DC | PRN

## 2021-05-14 NOTE — Progress Notes (Signed)
? ?Subjective:  ?Patient ID: ARAH ARO, female    DOB: 19-Apr-1940  Age: 81 y.o. MRN: [id number] ? ?Chief Complaint  ?Patient presents with  ? Diabetes  ? Hypertension  ? Hyperlipidemia  ? ? ?Diabetes:  ?Complications: polyneuropathy ?Glucose checking: Once daily. ?Glucose logs: 200-250 ?Hypoglycemia: no  ?Most recent A1C: 6.3 ?Current medications: None ?Last Eye Exam: overdue ?Foot checks: Daily ? ?Hyperlipidemia: ?Current medications: Patient is currently taking Rosuvastatin 5 mg take 1 tablet daily. ? ?Hypertension associated with diabetes: ?Current medications: Patient is currently taking losartan 25 mg take 1 tablet daily. ? ?Hypothyroidism: ?Patient is currently taking Levothyroxine 112 mcg take 1 tablet daily. ? ?Diet: healthy ?Exercise: yes ?  ?On going abdominal pain. Had a little diarrhea so held her miralax.  ?Hand/finger pain. Middle finger of left and index of right.  ? ?Current Outpatient Medications on File Prior to Visit  ?Medication Sig Dispense Refill  ? Accu-Chek Softclix Lancets lancets Use as instructed 100 each 5  ? ascorbic acid (VITAMIN C) 500 MG tablet Take by mouth.    ? Ginger, Zingiber officinalis, (GINGER ROOT PO) Take by mouth.    ? glucose blood (ACCU-CHEK AVIVA PLUS) test strip USE TO CHECK FASTING BLOOD SUGAR ONCE DAILY AS DIRECTED` 100 strip 3  ? ibuprofen (ADVIL) 600 MG tablet TAKE 1 TABLET(600 MG) BY MOUTH EVERY 8 HOURS AS NEEDED 90 tablet 1  ? levothyroxine (SYNTHROID) 112 MCG tablet TAKE 1 TABLET(112 MCG) BY MOUTH DAILY BEFORE AND BREAKFAST 90 tablet 1  ? losartan (COZAAR) 25 MG tablet TAKE 1 TABLET BY MOUTH DAILY 90 tablet 1  ? ondansetron (ZOFRAN ODT) 8 MG disintegrating tablet Take 0.5 tablets (4 mg total) by mouth every 8 (eight) hours as needed for nausea or vomiting. 30 tablet 1  ? polyethylene glycol powder (GLYCOLAX/MIRALAX) 17 GM/SCOOP powder Take by mouth.    ? prochlorperazine (COMPAZINE) 10 MG tablet Take 1 tablet (10 mg total) by mouth every 6 (six) hours as  needed for nausea or vomiting. 30 tablet 0  ? psyllium (METAMUCIL) 58.6 % packet Take by mouth.    ? rosuvastatin (CRESTOR) 5 MG tablet TAKE 1 TABLET(5 MG) BY MOUTH DAILY 90 tablet 1  ? vitamin E 180 MG (400 UNITS) capsule Take by mouth.    ? ?No current facility-administered medications on file prior to visit.  ? ?Past Medical History:  ?Diagnosis Date  ? Age-related osteoporosis without current pathological fracture 07/03/2019  ? Cancer [facility]) 1990  ? Left Breast Cancer   ? Cancer [facility]) 2017  ? Thyroid Cancer  ? Cervicalgia 07/03/2019  ? Diabetes mellitus without complication ([place])   ? Generalized anxiety disorder   ? GERD (gastroesophageal reflux disease)   ? Hypertension   ? Low back pain with sciatica 07/03/2019  ? Obesity due to excess calories 07/03/2019  ? S/P repair of paraesophageal hernia 10/20/2013  ? S/P TKR (total knee replacement) 07/25/2012  ? ?Past Surgical History:  ?Procedure Laterality Date  ? 41 HOUR [place] STUDY N/A 08/07/2013  ? Procedure: [place] STUDY;  Surgeon: Missy Sabins, MD;  [facility];  Service: Endoscopy;  Laterality: N/A;  ? APPENDECTOMY  03/17/2016  ? ESOPHAGEAL MANOMETRY N/A 08/07/2013  ? Procedure: ESOPHAGEAL MANOMETRY (EM);  Surgeon: Missy Sabins, MD;  [facility];  Service: Endoscopy;  Laterality: N/A;  ? JOINT REPLACEMENT    ? MASTECTOMY Left 1990  ? THYROIDECTOMY Bilateral 08/2015  ?  ?Family History  ?Problem Relation Age of  Onset  ? Leukemia Brother   ? ?Social History  ? ?Socioeconomic History  ? Marital status: Divorced  ?  Spouse name: Not on file  ? Number of children: 0  ? Years of education: Not on file  ? Highest education level: Not on file  ?Occupational History  ? Occupation: Retired from [employer]  ?Tobacco Use  ? Smoking status: Never  ? Smokeless tobacco: Never  ?Vaping Use  ? Vaping Use: Never used  ?Substance and Sexual Activity  ? Alcohol use: Never  ? Drug use: Never  ? Sexual activity: Not on file  ?Other Topics Concern  ? Not on file  ?Social  History Narrative  ? Not on file  ? ?Social Determinants of Health  ? ?Financial Resource Strain: Not on file  ?Food Insecurity: No Food Insecurity  ? Worried About Charity fundraiser in the Last Year: Never true  ? Ran Out of Food in the Last Year: Never true  ?Transportation Needs: No Transportation Needs  ? Lack of Transportation (Medical): No  ? Lack of Transportation (Non-Medical): No  ?Physical Activity: Insufficiently Active  ? Days of Exercise per Week: 5 days  ? Minutes of Exercise per Session: 20 min  ?Stress: No Stress Concern Present  ? Feeling of Stress : Not at all  ?Social Connections: Not on file  ? ? ?Review of Systems  ?Constitutional:  Negative for appetite change, fatigue and fever.  ?HENT:  Negative for congestion, ear pain, sinus pressure and sore throat.   ?Respiratory:  Negative for cough, chest tightness, shortness of breath and wheezing.   ?Cardiovascular:  Negative for chest pain and palpitations.  ?Gastrointestinal:  Positive for nausea (2 weeks). Negative for abdominal pain, constipation, diarrhea and vomiting.  ?Genitourinary:  Negative for dysuria and hematuria.  ?Musculoskeletal:  Positive for back pain. Negative for arthralgias, joint swelling and myalgias.  ?Skin:  Negative for rash.  ?Neurological:  Negative for dizziness, weakness and headaches.  ?Psychiatric/Behavioral:  Negative for dysphoric mood. The patient is not nervous/anxious.   ? ? ?Objective:  ?BP (!) 110/58   Pulse 76   Temp 98.2 ?F (36.8 ?C)   Resp 18   Ht 4' 10.5" (1.486 m)   Wt 164 lb 3.2 oz (74.5 kg)   SpO2 95%   BMI 33.73 kg/m?  ? ? ?  05/15/2021  ?  8:53 AM 04/24/2021  ?  3:42 PM 02/19/2021  ?  9:00 AM  ?BP/Weight  ?Systolic BP 101 751 025  ?Diastolic BP 58 64 60  ?Wt. (Lbs) 164.2 166 159.6  ?BMI 33.73 kg/m2 33.53 kg/m2 32.24 kg/m2  ? ? ?Physical Exam ?Vitals reviewed.  ?Constitutional:   ?   Appearance: Normal appearance. She is normal weight.  ?Neck:  ?   Vascular: No carotid bruit.  ?Cardiovascular:  ?    Rate and Rhythm: Normal rate and regular rhythm.  ?   Heart sounds: Normal heart sounds.  ?Pulmonary:  ?   Effort: Pulmonary effort is normal. No respiratory distress.  ?   Breath sounds: Normal breath sounds.  ?Abdominal:  ?   General: Abdomen is flat. Bowel sounds are normal.  ?   Palpations: Abdomen is soft.  ?   Tenderness: There is no abdominal tenderness.  ?Musculoskeletal:     ?   General: Tenderness (DIPs of both the Middle finger of left and index of right. enlarged. erythematous) present.  ?Neurological:  ?   Mental Status: She is alert and oriented to person,  place, and time.  ?Psychiatric:     ?   Mood and Affect: Mood normal.     ?   Behavior: Behavior normal.  ? ? ?Diabetic Foot Exam - Simple   ?Simple Foot Form ? 05/15/2021  9:19 AM  ?Visual Inspection ?No deformities, no ulcerations, no other skin breakdown bilaterally: Yes ?Sensation Testing ?Intact to touch and monofilament testing bilaterally: Yes ?Pulse Check ?Posterior Tibialis and Dorsalis pulse intact bilaterally: Yes ?Comments ?  ?  ? ?Lab Results  ?Component Value Date  ? WBC 9.9 05/15/2021  ? HGB 13.8 05/15/2021  ? HCT 40.9 05/15/2021  ? PLT 278 05/15/2021  ? GLUCOSE 125 (H) 05/15/2021  ? CHOL 173 05/15/2021  ? TRIG 162 (H) 05/15/2021  ? HDL 62 05/15/2021  ? Tensed 83 05/15/2021  ? ALT 21 05/15/2021  ? AST 21 05/15/2021  ? NA 138 05/15/2021  ? K 4.4 05/15/2021  ? CL 101 05/15/2021  ? CREATININE 0.94 05/15/2021  ? BUN 13 05/15/2021  ? CO2 18 (L) 05/15/2021  ? TSH 1.770 05/15/2021  ? INR 0.99 01/21/2010  ? HGBA1C 6.4 (H) 05/15/2021  ? MICROALBUR 30 04/05/2019  ? ? ? ? ?Assessment & Plan:  ? ?Problem List Items Addressed This Visit   ? ?  ? Cardiovascular and Mediastinum  ? Hypertension complicating diabetes ([place])  ?  Well controlled.  ?No changes to medicines. Continue losartan 25 mg daily.  ?Continue to work on eating a healthy diet and exercise.  ?Labs drawn today.  ? ?  ?  ? Relevant Orders  ? CBC With Diff/Platelet (Completed)  ?  Comprehensive metabolic panel (Completed)  ?  ? Digestive  ? GERD without esophagitis  ?  Increase nexium to 40 mg twice daily  ?  ?  ? Relevant Medications  ? dicyclomine (BENTYL) 20 MG tablet  ? esomeprazole (NEXIUM) 40

## 2021-05-15 ENCOUNTER — Ambulatory Visit (INDEPENDENT_AMBULATORY_CARE_PROVIDER_SITE_OTHER): Payer: Medicare HMO | Admitting: Family Medicine

## 2021-05-15 ENCOUNTER — Encounter: Payer: Self-pay | Admitting: Family Medicine

## 2021-05-15 VITALS — BP 110/58 | HR 76 | Temp 98.2°F | Resp 18 | Ht 58.5 in | Wt 164.2 lb

## 2021-05-15 DIAGNOSIS — R197 Diarrhea, unspecified: Secondary | ICD-10-CM

## 2021-05-15 DIAGNOSIS — I152 Hypertension secondary to endocrine disorders: Secondary | ICD-10-CM | POA: Diagnosis not present

## 2021-05-15 DIAGNOSIS — M79645 Pain in left finger(s): Secondary | ICD-10-CM | POA: Diagnosis not present

## 2021-05-15 DIAGNOSIS — M79644 Pain in right finger(s): Secondary | ICD-10-CM | POA: Diagnosis not present

## 2021-05-15 DIAGNOSIS — E1159 Type 2 diabetes mellitus with other circulatory complications: Secondary | ICD-10-CM | POA: Diagnosis not present

## 2021-05-15 DIAGNOSIS — K219 Gastro-esophageal reflux disease without esophagitis: Secondary | ICD-10-CM

## 2021-05-15 DIAGNOSIS — E1142 Type 2 diabetes mellitus with diabetic polyneuropathy: Secondary | ICD-10-CM | POA: Diagnosis not present

## 2021-05-15 DIAGNOSIS — E782 Mixed hyperlipidemia: Secondary | ICD-10-CM | POA: Diagnosis not present

## 2021-05-15 DIAGNOSIS — E1169 Type 2 diabetes mellitus with other specified complication: Secondary | ICD-10-CM

## 2021-05-15 DIAGNOSIS — R1010 Upper abdominal pain, unspecified: Secondary | ICD-10-CM | POA: Diagnosis not present

## 2021-05-15 DIAGNOSIS — M545 Low back pain, unspecified: Secondary | ICD-10-CM

## 2021-05-15 DIAGNOSIS — E890 Postprocedural hypothyroidism: Secondary | ICD-10-CM

## 2021-05-15 DIAGNOSIS — E785 Hyperlipidemia, unspecified: Secondary | ICD-10-CM

## 2021-05-15 DIAGNOSIS — G8929 Other chronic pain: Secondary | ICD-10-CM

## 2021-05-15 MED ORDER — PREDNISONE 10 MG PO TABS: 10.0000 mg | ORAL_TABLET | Freq: Two times a day (BID) | ORAL | 0 refills | Status: DC

## 2021-05-15 MED ORDER — DICYCLOMINE HCL 20 MG PO TABS: 20.0000 mg | ORAL_TABLET | Freq: Three times a day (TID) | ORAL | 4 refills | Status: DC

## 2021-05-15 MED ORDER — ESOMEPRAZOLE MAGNESIUM 40 MG PO CPDR: 40.0000 mg | DELAYED_RELEASE_CAPSULE | Freq: Two times a day (BID) | ORAL | 1 refills | Status: DC

## 2021-05-15 NOTE — Patient Instructions (Addendum)
Joint pain: ?Start prednisone 10 mg one twice a day x 1 week.  ?Hold ibuprofen while on prednisone. ?Refer to the rheumatologist . ? ?Abdominal pain:  ?Increase dicyclomine 20 mg before meals and before bed.  ?Try to take nexium 40 mg one twice a day.  ?

## 2021-05-19 LAB — HEMOGLOBIN A1C
Est. average glucose Bld gHb Est-mCnc: 137 mg/dL
Hgb A1c MFr Bld: 6.4 % — ABNORMAL HIGH (ref 4.8–5.6)

## 2021-05-19 LAB — COMPREHENSIVE METABOLIC PANEL
ALT: 21 IU/L (ref 0–32)
AST: 21 IU/L (ref 0–40)
Albumin/Globulin Ratio: 1.9 (ref 1.2–2.2)
Albumin: 4.8 g/dL — ABNORMAL HIGH (ref 3.7–4.7)
Alkaline Phosphatase: 107 IU/L (ref 44–121)
BUN/Creatinine Ratio: 14 (ref 12–28)
BUN: 13 mg/dL (ref 8–27)
Bilirubin Total: 0.4 mg/dL (ref 0.0–1.2)
CO2: 18 mmol/L — ABNORMAL LOW (ref 20–29)
Calcium: 9.5 mg/dL (ref 8.7–10.3)
Chloride: 101 mmol/L (ref 96–106)
Creatinine, Ser: 0.94 mg/dL (ref 0.57–1.00)
Globulin, Total: 2.5 g/dL (ref 1.5–4.5)
Glucose: 125 mg/dL — ABNORMAL HIGH (ref 70–99)
Potassium: 4.4 mmol/L (ref 3.5–5.2)
Sodium: 138 mmol/L (ref 134–144)
Total Protein: 7.3 g/dL (ref 6.0–8.5)
eGFR: 61 mL/min/{1.73_m2} (ref >59)

## 2021-05-19 LAB — URIC ACID: Uric Acid: 5.1 mg/dL (ref 3.1–7.9)

## 2021-05-19 LAB — CBC WITH DIFF/PLATELET
Basophils Absolute: 0.1 10*3/uL (ref 0.0–0.2)
Basos: 1 %
EOS (ABSOLUTE): 0.1 10*3/uL (ref 0.0–0.4)
Eos: 1 %
Hematocrit: 40.9 % (ref 34.0–46.6)
Hemoglobin: 13.8 g/dL (ref 11.1–15.9)
Immature Grans (Abs): 0 10*3/uL (ref 0.0–0.1)
Immature Granulocytes: 0 %
Lymphocytes Absolute: 1.9 10*3/uL (ref 0.7–3.1)
Lymphs: 19 %
MCH: 32.1 pg (ref 26.6–33.0)
MCHC: 33.7 g/dL (ref 31.5–35.7)
MCV: 95 fL (ref 79–97)
Monocytes Absolute: 0.7 10*3/uL (ref 0.1–0.9)
Monocytes: 8 %
Neutrophils Absolute: 7 10*3/uL (ref 1.4–7.0)
Neutrophils: 71 %
Platelets: 278 10*3/uL (ref 150–450)
RBC: 4.3 x10E6/uL (ref 3.77–5.28)
RDW: 13.2 % (ref 11.7–15.4)
WBC: 9.9 10*3/uL (ref 3.4–10.8)

## 2021-05-19 LAB — RHEUMATOID FACTOR: Rheumatoid fact SerPl-aCnc: <10 IU/mL (ref <14.0)

## 2021-05-19 LAB — C-REACTIVE PROTEIN: CRP: 1 mg/L (ref 0–10)

## 2021-05-19 LAB — LIPID PANEL
Chol/HDL Ratio: 2.8 ratio (ref 0.0–4.4)
Cholesterol, Total: 173 mg/dL (ref 100–199)
HDL: 62 mg/dL (ref >39)
LDL Chol Calc (NIH): 83 mg/dL (ref 0–99)
Triglycerides: 162 mg/dL — ABNORMAL HIGH (ref 0–149)
VLDL Cholesterol Cal: 28 mg/dL (ref 5–40)

## 2021-05-19 LAB — CYCLIC CITRUL PEPTIDE ANTIBODY, IGG/IGA: Cyclic Citrullin Peptide Ab: 4 units (ref 0–19)

## 2021-05-19 LAB — TSH: TSH: 1.77 u[IU]/mL (ref 0.450–4.500)

## 2021-05-19 LAB — MICROALBUMIN / CREATININE URINE RATIO
Creatinine, Urine: 88.3 mg/dL
Microalb/Creat Ratio: 20 mg/g creat (ref 0–29)
Microalbumin, Urine: 17.6 ug/mL

## 2021-05-19 LAB — SEDIMENTATION RATE: Sed Rate: 27 mm/hr (ref 0–40)

## 2021-05-19 NOTE — Assessment & Plan Note (Signed)
Well controlled.  Continue crestor 20 mg daily and fish oil 1000 mg 2 daily.  Recommend continue to work on eating healthy diet and exercise. Check labs.  

## 2021-05-19 NOTE — Assessment & Plan Note (Addendum)
Increase nexium to 40 mg twice daily  ?

## 2021-05-19 NOTE — Assessment & Plan Note (Addendum)
Well controlled.  ?No changes to medicines. Continue crestor 5 mg before bed  ?Continue to work on eating a healthy diet and exercise.  ?Labs drawn today.  ?

## 2021-05-19 NOTE — Assessment & Plan Note (Signed)
The current medical regimen is effective;  continue present plan and medications.  

## 2021-05-19 NOTE — Assessment & Plan Note (Signed)
Well controlled.  ?No changes to medicines.  ?Continue to work on eating a healthy diet and exercise.  ?Labs drawn today.  ?

## 2021-05-19 NOTE — Assessment & Plan Note (Signed)
Management per Specialist. ?

## 2021-05-21 DIAGNOSIS — M79644 Pain in right finger(s): Secondary | ICD-10-CM | POA: Insufficient documentation

## 2021-05-21 DIAGNOSIS — R1010 Upper abdominal pain, unspecified: Secondary | ICD-10-CM | POA: Insufficient documentation

## 2021-05-21 DIAGNOSIS — R1011 Right upper quadrant pain: Secondary | ICD-10-CM | POA: Insufficient documentation

## 2021-05-21 NOTE — Assessment & Plan Note (Addendum)
Arthritis panel ordered.  ?Start on prednisone  10 mg twice daily x 10 days. Longer lower course due to diabetes. ?Hold ibuprofen while on prednisone. ?Refer to the rheumatologist . ?

## 2021-05-21 NOTE — Assessment & Plan Note (Addendum)
Increase nexium to 40 mg twice daily.  ?Increase dicyclomine to 20 mg before meals and before bed.  ?

## 2021-05-21 NOTE — Assessment & Plan Note (Signed)
Well controlled.  No changes to medicines. Continue losartan 25 mg daily.  Continue to work on eating a healthy diet and exercise.  Labs drawn today.   

## 2021-06-09 ENCOUNTER — Other Ambulatory Visit: Payer: Self-pay | Admitting: Physician Assistant

## 2021-06-12 ENCOUNTER — Telehealth: Payer: Self-pay

## 2021-06-12 NOTE — Telephone Encounter (Signed)
Patient was finally scheduled with Rheumatology and the soonest appointment they could schedule her for was September. Patient understands and is aware of the appointment time and date but is questioning what she can do for her hand pain she has in the mean time. She states she is taking tylenol for break through pain but what ever the medication we gave her last time which according to the note was prednisone she stated made her sleepy? Please advise. Thank you! ?

## 2021-06-16 ENCOUNTER — Other Ambulatory Visit: Payer: Self-pay

## 2021-06-16 DIAGNOSIS — M79644 Pain in right finger(s): Secondary | ICD-10-CM

## 2021-06-16 NOTE — Telephone Encounter (Signed)
Left message for patient to call.  She was instructed to call us back for follow-up of prednisone.   ?

## 2021-06-16 NOTE — Telephone Encounter (Signed)
Patient returned call. She feels prednisone did help but made her sleepy. She is ok with different Rheumatologist- in [place]. Referral placed.  ? ?If need to call back, will be home by 4.  ?Harrell Lark 06/16/21 9:08 AM ? ?

## 2021-06-17 NOTE — Telephone Encounter (Signed)
Spoke to pt about another rheumatology office in [place], they do not have any sooner appointments she will keep her current appt and they have her on the cancellation list ?

## 2021-06-19 DIAGNOSIS — R69 Illness, unspecified: Secondary | ICD-10-CM | POA: Diagnosis not present

## 2021-06-25 ENCOUNTER — Ambulatory Visit (INDEPENDENT_AMBULATORY_CARE_PROVIDER_SITE_OTHER): Payer: Medicare HMO | Admitting: Sports Medicine

## 2021-06-25 ENCOUNTER — Encounter: Payer: Self-pay | Admitting: Sports Medicine

## 2021-06-25 DIAGNOSIS — M79674 Pain in right toe(s): Secondary | ICD-10-CM

## 2021-06-25 DIAGNOSIS — M2042 Other hammer toe(s) (acquired), left foot: Secondary | ICD-10-CM

## 2021-06-25 DIAGNOSIS — M2041 Other hammer toe(s) (acquired), right foot: Secondary | ICD-10-CM

## 2021-06-25 DIAGNOSIS — M79675 Pain in left toe(s): Secondary | ICD-10-CM

## 2021-06-25 DIAGNOSIS — E1151 Type 2 diabetes mellitus with diabetic peripheral angiopathy without gangrene: Secondary | ICD-10-CM

## 2021-06-25 DIAGNOSIS — M21611 Bunion of right foot: Secondary | ICD-10-CM

## 2021-06-25 DIAGNOSIS — L84 Corns and callosities: Secondary | ICD-10-CM

## 2021-06-25 DIAGNOSIS — M2011 Hallux valgus (acquired), right foot: Secondary | ICD-10-CM

## 2021-06-25 DIAGNOSIS — B351 Tinea unguium: Secondary | ICD-10-CM

## 2021-06-25 NOTE — Progress Notes (Signed)
Subjective: ?Amy Mccann is a 81 y.o. female patient with history of diabetes who presents to office today complaining of long,mildly painful nails  while ambulating in shoes; unable to trim. Patient states that she is having pain to the toes and in between on the right. ?PCP Rochel Brome, MD last visit 05/24/21. ? ?Patient Active Problem List  ? Diagnosis Date Noted  ? Pain in finger of both hands 05/21/2021  ? Pain of upper abdomen 05/21/2021  ? Chronic midline low back pain without sciatica 01/06/2021  ? Primary osteoarthritis of both hands 01/06/2021  ? Hallux valgus with bunions 07/04/2019  ? Low back pain with sciatica 07/03/2019  ? Cervicalgia 07/03/2019  ? Class 1 obesity due to excess calories with serious comorbidity and body mass index (BMI) of 32.0 to 32.9 in adult 07/03/2019  ? Generalized anxiety disorder 07/03/2019  ? Osteopenia 07/03/2019  ? Hypertension complicating diabetes ([place]) 04/05/2019  ? GERD without esophagitis 04/05/2019  ? Mixed hyperlipidemia 04/05/2019  ? Lumbar spondylosis 02/21/2019  ? Postoperative hypothyroidism 10/02/2015  ? Type 2 diabetes mellitus with polyneuropathy ([place]) 06/27/2013  ? History of left breast cancer 06/27/2013  ? ?Current Outpatient Medications on File Prior to Visit  ?Medication Sig Dispense Refill  ? Accu-Chek Softclix Lancets lancets Use as instructed 100 each 5  ? ascorbic acid (VITAMIN C) 500 MG tablet Take by mouth.    ? dicyclomine (BENTYL) 20 MG tablet Take 1 tablet (20 mg total) by mouth 4 (four) times daily -  before meals and at bedtime. 120 tablet 4  ? esomeprazole (NEXIUM) 40 MG capsule Take 1 capsule (40 mg total) by mouth 2 (two) times daily before a meal. 180 capsule 1  ? Ginger, Zingiber officinalis, (GINGER ROOT PO) Take by mouth.    ? glucose blood (ACCU-CHEK AVIVA PLUS) test strip USE TO CHECK FASTING BLOOD SUGAR ONCE DAILY AS DIRECTED` 100 strip 3  ? ibuprofen (ADVIL) 600 MG tablet TAKE 1 TABLET(600 MG) BY MOUTH EVERY 8 HOURS AS NEEDED 90  tablet 1  ? levothyroxine (SYNTHROID) 112 MCG tablet TAKE 1 TABLET(112 MCG) BY MOUTH DAILY BEFORE BREAKFAST 90 tablet 1  ? losartan (COZAAR) 25 MG tablet TAKE 1 TABLET BY MOUTH DAILY 90 tablet 1  ? ondansetron (ZOFRAN ODT) 8 MG disintegrating tablet Take 0.5 tablets (4 mg total) by mouth every 8 (eight) hours as needed for nausea or vomiting. 30 tablet 1  ? polyethylene glycol powder (GLYCOLAX/MIRALAX) 17 GM/SCOOP powder Take by mouth.    ? predniSONE (DELTASONE) 10 MG tablet Take 1 tablet (10 mg total) by mouth 2 (two) times daily with a meal. 14 tablet 0  ? prochlorperazine (COMPAZINE) 10 MG tablet Take 1 tablet (10 mg total) by mouth every 6 (six) hours as needed for nausea or vomiting. 30 tablet 0  ? psyllium (METAMUCIL) 58.6 % packet Take by mouth.    ? rosuvastatin (CRESTOR) 5 MG tablet TAKE 1 TABLET(5 MG) BY MOUTH DAILY 90 tablet 1  ? vitamin E 180 MG (400 UNITS) capsule Take by mouth.    ? ?No current facility-administered medications on file prior to visit.  ? ?Allergies  ?Allergen Reactions  ? Ergocalciferol Other (See Comments)  ?  somnolence  ? Codeine Itching and Rash  ? Penicillins Rash  ? ? ?Recent Results (from the past 2160 hour(s))  ?CBC With Diff/Platelet     Status: None  ? Collection Time: 05/15/21 10:06 AM  ?Result Value Ref Range  ? WBC 9.9 3.4 -  10.8 x10E3/uL  ? RBC 4.30 3.77 - 5.28 x10E6/uL  ? Hemoglobin 13.8 11.1 - 15.9 g/dL  ? Hematocrit 40.9 34.0 - 46.6 %  ? MCV 95 79 - 97 fL  ? MCH 32.1 26.6 - 33.0 pg  ? MCHC 33.7 31.5 - 35.7 g/dL  ? RDW 13.2 11.7 - 15.4 %  ? Platelets 278 150 - 450 x10E3/uL  ? Neutrophils 71 Not Estab. %  ? Lymphs 19 Not Estab. %  ? Monocytes 8 Not Estab. %  ? Eos 1 Not Estab. %  ? Basos 1 Not Estab. %  ? Neutrophils Absolute 7.0 1.4 - 7.0 x10E3/uL  ? Lymphocytes Absolute 1.9 0.7 - 3.1 x10E3/uL  ? Monocytes Absolute 0.7 0.1 - 0.9 x10E3/uL  ? EOS (ABSOLUTE) 0.1 0.0 - 0.4 x10E3/uL  ? Basophils Absolute 0.1 0.0 - 0.2 x10E3/uL  ? Immature Granulocytes 0 Not Estab. %  ?  Immature Grans (Abs) 0.0 0.0 - 0.1 x10E3/uL  ?Microalbumin / creatinine urine ratio     Status: None  ? Collection Time: 05/15/21 10:06 AM  ?Result Value Ref Range  ? Creatinine, Urine 88.3 Not Estab. mg/dL  ? Microalbumin, Urine 17.6 Not Estab. ug/mL  ? Microalb/Creat Ratio 20 0 - 29 mg/g creat  ?  Comment:                        Normal:                0 -  29 ?                       Moderately increased: 30 - 300 ?                       Severely increased:       >300 ?  ?Comprehensive metabolic panel     Status: Abnormal  ? Collection Time: 05/15/21 10:06 AM  ?Result Value Ref Range  ? Glucose 125 (H) 70 - 99 mg/dL  ? BUN 13 8 - 27 mg/dL  ? Creatinine, Ser 0.94 0.57 - 1.00 mg/dL  ? eGFR 61 >59 mL/min/1.73  ? BUN/Creatinine Ratio 14 12 - 28  ? Sodium 138 134 - 144 mmol/L  ? Potassium 4.4 3.5 - 5.2 mmol/L  ? Chloride 101 96 - 106 mmol/L  ? CO2 18 (L) 20 - 29 mmol/L  ? Calcium 9.5 8.7 - 10.3 mg/dL  ? Total Protein 7.3 6.0 - 8.5 g/dL  ? Albumin 4.8 (H) 3.7 - 4.7 g/dL  ? Globulin, Total 2.5 1.5 - 4.5 g/dL  ? Albumin/Globulin Ratio 1.9 1.2 - 2.2  ? Bilirubin Total 0.4 0.0 - 1.2 mg/dL  ? Alkaline Phosphatase 107 44 - 121 IU/L  ? AST 21 0 - 40 IU/L  ? ALT 21 0 - 32 IU/L  ?Hemoglobin A1c     Status: Abnormal  ? Collection Time: 05/15/21 10:06 AM  ?Result Value Ref Range  ? Hgb A1c MFr Bld 6.4 (H) 4.8 - 5.6 %  ?  Comment:          Prediabetes: 5.7 - 6.4 ?         Diabetes: >6.4 ?         Glycemic control for adults with diabetes: <7.0 ?  ? Est. average glucose Bld gHb Est-mCnc 137 mg/dL  ?Lipid panel     Status: Abnormal  ? Collection Time: 05/15/21 10:06 AM  ?  Result Value Ref Range  ? Cholesterol, Total 173 100 - 199 mg/dL  ? Triglycerides 162 (H) 0 - 149 mg/dL  ? HDL 62 >39 mg/dL  ? VLDL Cholesterol Cal 28 5 - 40 mg/dL  ? LDL Chol Calc (NIH) 83 0 - 99 mg/dL  ? Chol/HDL Ratio 2.8 0.0 - 4.4 ratio  ?  Comment:                                   T. Chol/HDL Ratio ?                                            Men  Women ?                               1/2 Avg.Risk  3.4    3.3 ?                                  Avg.Risk  5.0    4.4 ?                               2X Avg.Risk  9.6    7.1 ?                               3X Avg.Risk 23.4   11.0 ?  ?TSH     Status: None  ? Collection Time: 05/15/21 10:06 AM  ?Result Value Ref Range  ? TSH 1.770 0.450 - 4.500 uIU/mL  ?C-reactive protein     Status: None  ? Collection Time: 05/15/21 10:06 AM  ?Result Value Ref Range  ? CRP 1 0 - 10 mg/L  ?Uric acid     Status: None  ? Collection Time: 05/15/21 10:06 AM  ?Result Value Ref Range  ? Uric Acid 5.1 3.1 - 7.9 mg/dL  ?  Comment:            Therapeutic target for gout patients: <6.0  ?Rheumatoid factor     Status: None  ? Collection Time: 05/15/21 10:06 AM  ?Result Value Ref Range  ? Rhuematoid fact SerPl-aCnc <10.0 <14.0 IU/mL  ?Sedimentation rate     Status: None  ? Collection Time: 05/15/21 10:06 AM  ?Result Value Ref Range  ? Sed Rate 27 0 - 40 mm/hr  ?CYCLIC CITRUL PEPTIDE ANTIBODY, IGG/IGA     Status: None  ? Collection Time: 05/15/21 10:06 AM  ?Result Value Ref Range  ? Cyclic Citrullin Peptide Ab 4 0 - 19 units  ?  Comment:                           Negative               <20 ?                          Weak positive      20 - 39 ?  Moderate positive  40 - 59 ?                          Strong positive        >59 ?  ? ? ?Objective: ?General: Patient is awake, alert, and oriented x 3 and in no acute distress. ? ?Integument: Skin is warm, dry and supple bilateral. Nails are tender, long, thickened and  ?dystrophic with subungual debris, consistent with onychomycosis, 1-5 bilateral. No signs of infection. Soft corn at right 4th toe medial aspect and hard corn at medial 1st MTPJs bilateral. Remaining integument unremarkable. ? ?Vasculature:  Dorsalis Pedis pulse 1/4 bilateral. Posterior Tibial pulse  1/4 bilateral.  ?Capillary fill time <3 sec 1-5 bilateral. No hair growth to the level of the digits. ?Temperature gradient within  normal limits. No varicosities present bilateral. No edema present bilateral.  ? ?Neurology: Gross sensation present via light touch.  ? ?Musculoskeletal: Asymptomatic bunion and hammertoe pedal deformities

## 2021-06-26 ENCOUNTER — Other Ambulatory Visit: Payer: Self-pay | Admitting: Family Medicine

## 2021-06-26 NOTE — Telephone Encounter (Signed)
Refill sent to pharmacy.   

## 2021-07-18 DIAGNOSIS — L84 Corns and callosities: Secondary | ICD-10-CM | POA: Diagnosis not present

## 2021-07-18 DIAGNOSIS — M79674 Pain in right toe(s): Secondary | ICD-10-CM | POA: Diagnosis not present

## 2021-07-20 NOTE — Progress Notes (Signed)
Cancelled.  

## 2021-07-24 DIAGNOSIS — R1013 Epigastric pain: Secondary | ICD-10-CM | POA: Diagnosis not present

## 2021-07-24 DIAGNOSIS — K5900 Constipation, unspecified: Secondary | ICD-10-CM | POA: Diagnosis not present

## 2021-08-05 DIAGNOSIS — H2703 Aphakia, bilateral: Secondary | ICD-10-CM | POA: Diagnosis not present

## 2021-08-05 DIAGNOSIS — E119 Type 2 diabetes mellitus without complications: Secondary | ICD-10-CM | POA: Diagnosis not present

## 2021-08-05 LAB — HM DIABETES EYE EXAM

## 2021-08-06 ENCOUNTER — Encounter: Payer: Self-pay | Admitting: Family Medicine

## 2021-08-14 ENCOUNTER — Other Ambulatory Visit: Payer: Self-pay | Admitting: Family Medicine

## 2021-08-19 ENCOUNTER — Telehealth: Payer: Self-pay

## 2021-08-19 DIAGNOSIS — Z1231 Encounter for screening mammogram for malignant neoplasm of breast: Secondary | ICD-10-CM

## 2021-08-19 NOTE — Telephone Encounter (Signed)
Patient calling as she is due for mammogram at [facility].   Last mammogram was 08/28/2020. Order placed with patient's preferences.   Harrell Lark 08/19/21 3:23 PM

## 2021-08-22 ENCOUNTER — Telehealth: Payer: Self-pay

## 2021-08-22 DIAGNOSIS — L84 Corns and callosities: Secondary | ICD-10-CM | POA: Diagnosis not present

## 2021-08-22 NOTE — Telephone Encounter (Signed)
Patient left VM questioning if mammogram has been scheduled.   Attempted to call back. No answer, left detailed VM. Mammogram has not been scheduled yet, if she has not heard from our office in 2 weeks she is to give [place] a call to check status.   Royce Macadamia, [place] 08/22/21 9:51 AM

## 2021-08-26 ENCOUNTER — Telehealth: Payer: Self-pay | Admitting: Family Medicine

## 2021-08-26 NOTE — Telephone Encounter (Signed)
   Amy Mccann has been scheduled for the following appointment:  WHAT: SCREENING MAMMOGRAM WHERE: [place] [facility] DATE: 09/23/21 TIME: 8:40 AM CHECK-IN   A message has been left for the patient.

## 2021-08-28 NOTE — Progress Notes (Signed)
Subjective:  Patient ID: Amy Mccann, female    DOB: 1940-05-18  Age: 81 y.o. MRN: [id number]  Chief Complaint  Patient presents with   Diabetes   Hyperlipidemia   Hypertension   HPI: Diabetes:  Complications: polyneuropathy Glucose checking: Once daily. Glucose logs: 100-140 Hypoglycemia: no  Most recent A1C: 6.4 Current medications: Losartan 25 mg daily.  Last Eye Exam: 08/05/2021. Saw Dr. Hulan Saas in [place]. He told her she had mild macular degeneration.  Foot checks: Daily  Hyperlipidemia: Current medications: Patient is currently taking Rosuvastatin 5 mg take 1 tablet daily.  Hypertension associated with diabetes: Current medications: Patient is currently taking losartan 25 mg take 1 tablet daily.  Hypothyroidism: Patient is currently taking Levothyroxine 112 mcg take 1 tablet daily.  Diet: healthy Exercise: yes  GERD: on nexium 40 mg twice daily.  Constipation: metamucil for 6 weeks, then is scheduled to return to see Dr. Lyda Jester. Needs colonoscopy.  Takes dicyclomine 20 mg once in am before breakfast.    Current Outpatient Medications on File Prior to Visit  Medication Sig Dispense Refill   psyllium (METAMUCIL) 58.6 % packet Take by mouth.     rosuvastatin (CRESTOR) 5 MG tablet TAKE 1 TABLET BY MOUTH DAILY 90 tablet 1   vitamin E 180 MG (400 UNITS) capsule Take by mouth.     ascorbic acid (VITAMIN C) 500 MG tablet Take by mouth.     dicyclomine (BENTYL) 20 MG tablet Take 1 tablet (20 mg total) by mouth 4 (four) times daily -  before meals and at bedtime. 120 tablet 4   esomeprazole (NEXIUM) 40 MG capsule Take 1 capsule (40 mg total) by mouth 2 (two) times daily before a meal. 180 capsule 1   glucose blood (ACCU-CHEK AVIVA PLUS) test strip USE TO CHECK FASTING BLOOD SUGAR ONCE DAILY AS DIRECTED` 100 strip 3   levothyroxine (SYNTHROID) 112 MCG tablet TAKE 1 TABLET(112 MCG) BY MOUTH DAILY BEFORE BREAKFAST 90 tablet 1   losartan (COZAAR) 25 MG tablet TAKE 1  TABLET BY MOUTH DAILY 90 tablet 1   polyethylene glycol powder (GLYCOLAX/MIRALAX) 17 GM/SCOOP powder Take by mouth.     No current facility-administered medications on file prior to visit.   Past Medical History:  Diagnosis Date   Age-related osteoporosis without current pathological fracture 07/03/2019   Cancer [facility]) 1990   Left Breast Cancer    Cancer ([place]) 2017   Thyroid Cancer   Cervicalgia 07/03/2019   Diabetes mellitus without complication (HCC)    Generalized anxiety disorder    GERD (gastroesophageal reflux disease)    Hypertension    Low back pain with sciatica 07/03/2019   Obesity due to excess calories 07/03/2019   S/P repair of paraesophageal hernia 10/20/2013   S/P TKR (total knee replacement) 07/25/2012   Past Surgical History:  Procedure Laterality Date   24 HOUR [place] STUDY N/A 08/07/2013   Procedure: 24 HOUR [place] STUDY;  Surgeon: Missy Sabins, MD;  Location: [facility];  Service: Endoscopy;  Laterality: N/A;   APPENDECTOMY  03/17/2016   ESOPHAGEAL MANOMETRY N/A 08/07/2013   Procedure: ESOPHAGEAL MANOMETRY (EM);  Surgeon: Missy Sabins, MD;  Location: [facility];  Service: Endoscopy;  Laterality: N/A;   JOINT REPLACEMENT     MASTECTOMY Left 1990   THYROIDECTOMY Bilateral 08/2015    Family History  Problem Relation Age of Onset   Leukemia Brother    Social History   Socioeconomic History   Marital status: Divorced    Spouse  name: Not on file   Number of children: 0   Years of education: Not on file   Highest education level: Not on file  Occupational History   Occupation: Retired from [employer]   Smoking status: Never   Smokeless tobacco: Never  Vaping Use   Vaping Use: Never used  Substance and Sexual Activity   Alcohol use: Never   Drug use: Never   Sexual activity: Not on file  Other Topics Concern   Not on file  Social History Narrative   Not on file   Social Determinants of Health   Financial Resource Strain: Not on file  Food  Insecurity: No Food Insecurity (02/19/2021)   Hunger Vital Sign    Worried About Running Out of Food in the Last Year: Never true    [place] in the Last Year: Never true  Transportation Needs: No Transportation Needs (02/19/2021)   PRAPARE - Hydrologist (Medical): No    Lack of Transportation (Non-Medical): No  Physical Activity: Insufficiently Active (02/19/2021)   Exercise Vital Sign    Days of Exercise per Week: 5 days    Minutes of Exercise per Session: 20 min  Stress: No Stress Concern Present (02/19/2021)   [place]    Feeling of Stress : Not at all  Social Connections: Not on file    Review of Systems  Constitutional:  Positive for fatigue. Negative for appetite change and fever.  HENT:  Negative for congestion, ear pain, sinus pressure and sore throat.   Respiratory:  Negative for cough, chest tightness, shortness of breath and wheezing.   Cardiovascular:  Negative for chest pain and palpitations.  Gastrointestinal:  Positive for abdominal pain and constipation (better controlled with Miralax and fiber). Negative for diarrhea, nausea and vomiting.  Genitourinary:  Negative for dysuria and hematuria.  Musculoskeletal:  Negative for arthralgias, back pain, joint swelling and myalgias.  Skin:  Negative for rash.  Neurological:  Negative for dizziness, weakness and headaches.  Psychiatric/Behavioral:  Negative for dysphoric mood. The patient is not nervous/anxious.      Objective:  BP (!) 110/54   Pulse 72   Temp (!) 97.1 F (36.2 C)   Resp 16   Ht 4' 10.5" (1.486 m)   Wt 159 lb (72.1 kg)   BMI 32.67 kg/m      08/29/2021    8:41 AM 05/15/2021    8:53 AM 04/24/2021    3:42 PM  BP/Weight  Systolic BP 213 086 578  Diastolic BP 54 58 64  Wt. (Lbs) 159 164.2 166  BMI 32.67 kg/m2 33.73 kg/m2 33.53 kg/m2    Physical Exam Vitals reviewed.  Constitutional:       Appearance: Normal appearance. She is normal weight.  Neck:     Vascular: No carotid bruit.  Cardiovascular:     Rate and Rhythm: Normal rate and regular rhythm.     Heart sounds: Normal heart sounds.  Pulmonary:     Effort: Pulmonary effort is normal.     Breath sounds: Normal breath sounds.  Abdominal:     General: Abdomen is flat. Bowel sounds are normal.     Palpations: Abdomen is soft.     Tenderness: There is no abdominal tenderness.  Neurological:     Mental Status: She is alert and oriented to person, place, and time.  Psychiatric:        Mood and  Affect: Mood normal.        Behavior: Behavior normal.     Diabetic Foot Exam - Simple   Simple Foot Form Diabetic Foot exam was performed with the following findings: Yes 08/29/2021  9:47 AM  Visual Inspection See comments: Yes Sensation Testing Intact to touch and monofilament testing bilaterally: Yes Pulse Check Posterior Tibialis and Dorsalis pulse intact bilaterally: Yes Comments Corn 3rd toe.       Lab Results  Component Value Date   WBC 8.6 08/29/2021   HGB 13.9 08/29/2021   HCT 39.9 08/29/2021   PLT 285 08/29/2021   GLUCOSE 119 (H) 08/29/2021   CHOL 168 08/29/2021   TRIG 157 (H) 08/29/2021   HDL 62 08/29/2021   LDLCALC 79 08/29/2021   ALT 21 08/29/2021   AST 20 08/29/2021   NA 136 08/29/2021   K 4.4 08/29/2021   CL 97 08/29/2021   CREATININE 0.92 08/29/2021   BUN 12 08/29/2021   CO2 22 08/29/2021   TSH 1.770 05/15/2021   INR 0.99 01/21/2010   HGBA1C 6.4 (H) 08/29/2021   MICROALBUR 30 04/05/2019      Assessment & Plan:   Problem List Items Addressed This Visit       Cardiovascular and Mediastinum   Hypertension complicating diabetes ([place])    Well controlled.  No changes to medicines. currently taking losartan 25 mg take 1 tablet daily. Continue to work on eating a healthy diet and exercise.  Labs drawn today.       Relevant Orders   CBC With Diff/Platelet (Completed)   Comprehensive  metabolic panel (Completed)     Digestive   GERD without esophagitis    The current medical regimen is effective;  continue present plan and medications. nexium 40 mg twice daily        Endocrine   Type 2 diabetes mellitus with polyneuropathy (HCC)    Control:  Recommend check sugars fasting daily. Recommend check feet daily. Recommend annual eye exams. Medicines: None Continue to work on eating a healthy diet and exercise.  Labs drawn today.        Relevant Orders   Hemoglobin A1c (Completed)   Postoperative hypothyroidism    Previously well controlled Continue Synthroid at current dose  Recheck TSH and adjust Synthroid as indicated       RESOLVED: Hyperlipidemia associated with type 2 diabetes mellitus ([place]) - Primary    Well controlled.  No changes to medicines.  Continue to work on eating a healthy diet and exercise.  Labs drawn today.       Relevant Orders   Lipid panel (Completed)     Other   Mixed hyperlipidemia    Well controlled.  No changes to medicines. Rosuvastatin 5 mg take 1 tablet daily. Continue to work on eating a healthy diet and exercise.  Labs drawn today.       Relevant Orders   Lipid panel (Completed)   Chronic midline low back pain without sciatica    Management per specialist.     .  No orders of the defined types were placed in this encounter.   Orders Placed This Encounter  Procedures   CBC With Diff/Platelet   Comprehensive metabolic panel   Lipid panel   Hemoglobin A1c   Cardiovascular Risk Assessment     Follow-up: Return in about 6 months (around 03/01/2022) for chronic fasting.  Blase Mess as a Education administrator for Rochel Brome, MD.,have documented all relevant documentation on the behalf of Elnita Maxwell  Lafayette Dunlevy, MD,as directed by  Rochel Brome, MD while in the presence of Rochel Brome, MD.   An After Visit Summary was printed and given to the patient.  Rochel Brome, MD [facility] [phone]

## 2021-08-29 ENCOUNTER — Ambulatory Visit (INDEPENDENT_AMBULATORY_CARE_PROVIDER_SITE_OTHER): Payer: Medicare HMO | Admitting: Family Medicine

## 2021-08-29 ENCOUNTER — Other Ambulatory Visit: Payer: Self-pay | Admitting: Family Medicine

## 2021-08-29 VITALS — BP 110/54 | HR 72 | Temp 97.1°F | Resp 16 | Ht 58.5 in | Wt 159.0 lb

## 2021-08-29 DIAGNOSIS — E890 Postprocedural hypothyroidism: Secondary | ICD-10-CM

## 2021-08-29 DIAGNOSIS — K219 Gastro-esophageal reflux disease without esophagitis: Secondary | ICD-10-CM | POA: Diagnosis not present

## 2021-08-29 DIAGNOSIS — E1159 Type 2 diabetes mellitus with other circulatory complications: Secondary | ICD-10-CM

## 2021-08-29 DIAGNOSIS — I152 Hypertension secondary to endocrine disorders: Secondary | ICD-10-CM

## 2021-08-29 DIAGNOSIS — E785 Hyperlipidemia, unspecified: Secondary | ICD-10-CM | POA: Diagnosis not present

## 2021-08-29 DIAGNOSIS — E1142 Type 2 diabetes mellitus with diabetic polyneuropathy: Secondary | ICD-10-CM | POA: Diagnosis not present

## 2021-08-29 DIAGNOSIS — E1169 Type 2 diabetes mellitus with other specified complication: Secondary | ICD-10-CM

## 2021-08-29 DIAGNOSIS — M545 Low back pain, unspecified: Secondary | ICD-10-CM | POA: Diagnosis not present

## 2021-08-29 DIAGNOSIS — I10 Essential (primary) hypertension: Secondary | ICD-10-CM

## 2021-08-29 DIAGNOSIS — E782 Mixed hyperlipidemia: Secondary | ICD-10-CM

## 2021-08-29 DIAGNOSIS — G8929 Other chronic pain: Secondary | ICD-10-CM

## 2021-08-30 LAB — CBC WITH DIFF/PLATELET
Basophils Absolute: 0.1 10*3/uL (ref 0.0–0.2)
Basos: 1 %
EOS (ABSOLUTE): 0.1 10*3/uL (ref 0.0–0.4)
Eos: 1 %
Hematocrit: 39.9 % (ref 34.0–46.6)
Hemoglobin: 13.9 g/dL (ref 11.1–15.9)
Immature Grans (Abs): 0 10*3/uL (ref 0.0–0.1)
Immature Granulocytes: 0 %
Lymphocytes Absolute: 1.5 10*3/uL (ref 0.7–3.1)
Lymphs: 18 %
MCH: 33 pg (ref 26.6–33.0)
MCHC: 34.8 g/dL (ref 31.5–35.7)
MCV: 95 fL (ref 79–97)
Monocytes Absolute: 0.6 10*3/uL (ref 0.1–0.9)
Monocytes: 7 %
Neutrophils Absolute: 6.3 10*3/uL (ref 1.4–7.0)
Neutrophils: 73 %
Platelets: 285 10*3/uL (ref 150–450)
RBC: 4.21 x10E6/uL (ref 3.77–5.28)
RDW: 12.5 % (ref 11.7–15.4)
WBC: 8.6 10*3/uL (ref 3.4–10.8)

## 2021-08-30 LAB — COMPREHENSIVE METABOLIC PANEL
ALT: 21 IU/L (ref 0–32)
AST: 20 IU/L (ref 0–40)
Albumin/Globulin Ratio: 1.8 (ref 1.2–2.2)
Albumin: 4.4 g/dL (ref 3.8–4.8)
Alkaline Phosphatase: 110 IU/L (ref 44–121)
BUN/Creatinine Ratio: 13 (ref 12–28)
BUN: 12 mg/dL (ref 8–27)
Bilirubin Total: 0.4 mg/dL (ref 0.0–1.2)
CO2: 22 mmol/L (ref 20–29)
Calcium: 9.8 mg/dL (ref 8.7–10.3)
Chloride: 97 mmol/L (ref 96–106)
Creatinine, Ser: 0.92 mg/dL (ref 0.57–1.00)
Globulin, Total: 2.5 g/dL (ref 1.5–4.5)
Glucose: 119 mg/dL — ABNORMAL HIGH (ref 70–99)
Potassium: 4.4 mmol/L (ref 3.5–5.2)
Sodium: 136 mmol/L (ref 134–144)
Total Protein: 6.9 g/dL (ref 6.0–8.5)
eGFR: 63 mL/min/{1.73_m2} (ref >59)

## 2021-08-30 LAB — LIPID PANEL
Chol/HDL Ratio: 2.7 ratio (ref 0.0–4.4)
Cholesterol, Total: 168 mg/dL (ref 100–199)
HDL: 62 mg/dL (ref >39)
LDL Chol Calc (NIH): 79 mg/dL (ref 0–99)
Triglycerides: 157 mg/dL — ABNORMAL HIGH (ref 0–149)
VLDL Cholesterol Cal: 27 mg/dL (ref 5–40)

## 2021-08-30 LAB — HEMOGLOBIN A1C
Est. average glucose Bld gHb Est-mCnc: 137 mg/dL
Hgb A1c MFr Bld: 6.4 % — ABNORMAL HIGH (ref 4.8–5.6)

## 2021-08-31 NOTE — Assessment & Plan Note (Signed)
Well controlled.  ?No changes to medicines.  ?Continue to work on eating a healthy diet and exercise.  ?Labs drawn today.  ?

## 2021-08-31 NOTE — Assessment & Plan Note (Signed)
Management per specialist. 

## 2021-08-31 NOTE — Assessment & Plan Note (Signed)
Control:  Recommend check sugars fasting daily. Recommend check feet daily. Recommend annual eye exams. Medicines: None Continue to work on eating a healthy diet and exercise.  Labs drawn today.

## 2021-08-31 NOTE — Assessment & Plan Note (Signed)
Well controlled.  No changes to medicines. currently taking losartan 25 mg take 1 tablet daily. Continue to work on eating a healthy diet and exercise.  Labs drawn today.  

## 2021-08-31 NOTE — Assessment & Plan Note (Addendum)
Well controlled.  No changes to medicines. Rosuvastatin 5 mg take 1 tablet daily. Continue to work on eating a healthy diet and exercise.  Labs drawn today.

## 2021-08-31 NOTE — Assessment & Plan Note (Signed)
The current medical regimen is effective;  continue present plan and medications. nexium 40 mg twice daily 

## 2021-08-31 NOTE — Assessment & Plan Note (Signed)
Previously well controlled Continue Synthroid at current dose  Recheck TSH and adjust Synthroid as indicated   

## 2021-09-01 DIAGNOSIS — R1013 Epigastric pain: Secondary | ICD-10-CM | POA: Diagnosis not present

## 2021-09-01 DIAGNOSIS — K5900 Constipation, unspecified: Secondary | ICD-10-CM | POA: Diagnosis not present

## 2021-09-02 ENCOUNTER — Encounter: Payer: Self-pay | Admitting: Podiatrist

## 2021-09-02 ENCOUNTER — Ambulatory Visit (INDEPENDENT_AMBULATORY_CARE_PROVIDER_SITE_OTHER): Payer: Medicare HMO | Admitting: Podiatrist

## 2021-09-02 DIAGNOSIS — M79674 Pain in right toe(s): Secondary | ICD-10-CM | POA: Diagnosis not present

## 2021-09-02 DIAGNOSIS — M79675 Pain in left toe(s): Secondary | ICD-10-CM

## 2021-09-02 DIAGNOSIS — B351 Tinea unguium: Secondary | ICD-10-CM | POA: Diagnosis not present

## 2021-09-02 DIAGNOSIS — M2042 Other hammer toe(s) (acquired), left foot: Secondary | ICD-10-CM | POA: Diagnosis not present

## 2021-09-02 DIAGNOSIS — M2041 Other hammer toe(s) (acquired), right foot: Secondary | ICD-10-CM | POA: Diagnosis not present

## 2021-09-02 DIAGNOSIS — L84 Corns and callosities: Secondary | ICD-10-CM | POA: Diagnosis not present

## 2021-09-02 NOTE — Progress Notes (Signed)
Subjective: Amy Mccann is a 81 y.o. female patient with history of diabetes who presents to office today with a chief concern of a corn hurting on the lateral side of the right third toe.  She also has long,mildly painful nails  while ambulating in shoes; unable to trim.  Patient denies any new cramping, numbness, burning or tingling in the legs. She presents today I her diabetic shoes.  They appear to be in good shape   Cox, Kirsten, MD  is her primary care provider.  Patient Active Problem List   Diagnosis Date Noted   Pain in finger of both hands 05/21/2021   Pain of upper abdomen 05/21/2021   Chronic midline low back pain without sciatica 01/06/2021   Primary osteoarthritis of both hands 01/06/2021   Hallux valgus with bunions 07/04/2019   Low back pain with sciatica 07/03/2019   Cervicalgia 07/03/2019   Class 1 obesity due to excess calories with serious comorbidity and body mass index (BMI) of 32.0 to 32.9 in adult 07/03/2019   Generalized anxiety disorder 07/03/2019   Osteopenia 07/03/2019   Hyperlipidemia associated with type 2 diabetes mellitus ([place]) 04/05/2019   Hypertension complicating diabetes ([place]) 04/05/2019   GERD without esophagitis 04/05/2019   Mixed hyperlipidemia 04/05/2019   Lumbar spondylosis 02/21/2019   Postoperative hypothyroidism 10/02/2015   Type 2 diabetes mellitus with polyneuropathy ([place]) 06/27/2013   History of left breast cancer 06/27/2013   Current Outpatient Medications on File Prior to Visit  Medication Sig Dispense Refill   Accu-Chek Softclix Lancets lancets USE AS DIRECTED 100 each 5   ascorbic acid (VITAMIN C) 500 MG tablet Take by mouth.     dicyclomine (BENTYL) 20 MG tablet Take 1 tablet (20 mg total) by mouth 4 (four) times daily -  before meals and at bedtime. 120 tablet 4   esomeprazole (NEXIUM) 40 MG capsule Take 1 capsule (40 mg total) by mouth 2 (two) times daily before a meal. 180 capsule 1   glucose blood (ACCU-CHEK AVIVA PLUS) test  strip USE TO CHECK FASTING BLOOD SUGAR ONCE DAILY AS DIRECTED` 100 strip 3   levothyroxine (SYNTHROID) 112 MCG tablet TAKE 1 TABLET(112 MCG) BY MOUTH DAILY BEFORE BREAKFAST 90 tablet 1   losartan (COZAAR) 25 MG tablet TAKE 1 TABLET BY MOUTH DAILY 90 tablet 1   ondansetron (ZOFRAN ODT) 8 MG disintegrating tablet Take 0.5 tablets (4 mg total) by mouth every 8 (eight) hours as needed for nausea or vomiting. 30 tablet 1   polyethylene glycol powder (GLYCOLAX/MIRALAX) 17 GM/SCOOP powder Take by mouth.     psyllium (METAMUCIL) 58.6 % packet Take by mouth.     rosuvastatin (CRESTOR) 5 MG tablet TAKE 1 TABLET BY MOUTH DAILY 90 tablet 1   vitamin E 180 MG (400 UNITS) capsule Take by mouth.     No current facility-administered medications on file prior to visit.   Allergies  Allergen Reactions   Ergocalciferol Other (See Comments)    somnolence   Codeine Itching and Rash   Penicillins Rash      Objective: General: Patient is awake, alert, and oriented x 3 and in no acute distress.  Integument: Skin is warm, dry and supple bilateral. Hyperkeratotic lesion present lateral aspect of the right third toe- painful with pressure.  Intact integument is noted post debridement.  Nails are tender, long, thickened and dystrophic with subungual debris, consistent with onychomycosis, 1-5 bilateral. No signs of infection.    Vasculature:  Dorsalis Pedis pulse 1/4 bilateral. Posterior  Tibial pulse  1/4 bilateral.  Capillary fill time <3 sec 1-5 bilateral. Temperature gradient within normal limits. No varicosities present bilateral. No edema present bilateral.   Neurology: The patient has decreased sensation measured with a 5.07/10g Semmes Weinstein Monofilament at  /5 pedal sites bilateral . Vibratory sensation diminished bilateral with tuning fork. No Babinski sign present bilateral.   Musculoskeletal: Bunion and hammertoes noted bilateral- asymptomatic. Muscular strength 5/5 in all lower extremity  muscular groups bilateral without pain on range of motion . No tenderness with calf compression bilateral.  Assessment and Plan:   ICD-10-CM   1. Pain due to onychomycosis of nail  B35.1    M79.609     2. Acquired hammertoes of both feet  M20.41    M20.42     3. Callus  L84        -Examined patient. -pared callus lateral right third toe to level of healthy skin.  Applied a silicone toe cap.  -Mechanically debrided all nails 1-5 bilateral using sterile nail nipper and filed with dremel without incident  -Answered all patient questions -Patient to return  in 3 months for at risk foot care -Patient advised to call the office if any problems or questions arise in the meantime.  Bronson Ing, DPM

## 2021-09-10 ENCOUNTER — Telehealth: Payer: Self-pay

## 2021-09-10 NOTE — Telephone Encounter (Signed)
Patient left VM on main line stating she is having stomach issues. Misty sent secure chat so patient is contacted.   Contacted patient. No answer, left VM for return call for more information.   Royce Macadamia, [place] 09/10/21 1:46 PM

## 2021-09-10 NOTE — Telephone Encounter (Signed)
Patient returned call. She saw Dr Lyda Jester last week and has had "stomach bug" since Sunday. She feels she is having digestive problems and would like endoscopy. Recommended she reach out to Dr Lyda Jester for assistance as she recently saw him.   Harrell Lark 09/10/21 2:39 PM

## 2021-09-15 ENCOUNTER — Other Ambulatory Visit: Payer: Self-pay | Admitting: Nurse Practitioner

## 2021-09-15 DIAGNOSIS — R110 Nausea: Secondary | ICD-10-CM

## 2021-09-15 DIAGNOSIS — E871 Hypo-osmolality and hyponatremia: Secondary | ICD-10-CM | POA: Diagnosis not present

## 2021-09-15 DIAGNOSIS — R109 Unspecified abdominal pain: Secondary | ICD-10-CM | POA: Diagnosis not present

## 2021-09-15 DIAGNOSIS — F552 Abuse of laxatives: Secondary | ICD-10-CM | POA: Diagnosis not present

## 2021-09-15 DIAGNOSIS — K521 Toxic gastroenteritis and colitis: Secondary | ICD-10-CM | POA: Diagnosis not present

## 2021-09-15 DIAGNOSIS — R197 Diarrhea, unspecified: Secondary | ICD-10-CM | POA: Diagnosis not present

## 2021-09-15 DIAGNOSIS — T474X5A Adverse effect of other laxatives, initial encounter: Secondary | ICD-10-CM | POA: Diagnosis not present

## 2021-09-22 ENCOUNTER — Ambulatory Visit: Payer: Medicare HMO | Admitting: Podiatry

## 2021-09-23 DIAGNOSIS — Z1231 Encounter for screening mammogram for malignant neoplasm of breast: Secondary | ICD-10-CM | POA: Diagnosis not present

## 2021-09-23 LAB — MM DIGITAL SCREENING BILATERAL

## 2021-09-25 ENCOUNTER — Other Ambulatory Visit: Payer: Self-pay

## 2021-09-25 DIAGNOSIS — Z1231 Encounter for screening mammogram for malignant neoplasm of breast: Secondary | ICD-10-CM

## 2021-09-28 ENCOUNTER — Encounter: Payer: Self-pay | Admitting: Family Medicine

## 2021-10-01 DIAGNOSIS — L84 Corns and callosities: Secondary | ICD-10-CM | POA: Diagnosis not present

## 2021-10-21 ENCOUNTER — Ambulatory Visit: Payer: Medicare HMO | Admitting: Oncology

## 2021-10-21 ENCOUNTER — Ambulatory Visit (INDEPENDENT_AMBULATORY_CARE_PROVIDER_SITE_OTHER): Payer: Medicare HMO | Admitting: Podiatry

## 2021-10-21 ENCOUNTER — Encounter: Payer: Self-pay | Admitting: Podiatry

## 2021-10-21 ENCOUNTER — Telehealth: Payer: Self-pay | Admitting: Podiatry

## 2021-10-21 DIAGNOSIS — L84 Corns and callosities: Secondary | ICD-10-CM | POA: Diagnosis not present

## 2021-10-21 DIAGNOSIS — E1142 Type 2 diabetes mellitus with diabetic polyneuropathy: Secondary | ICD-10-CM

## 2021-10-21 DIAGNOSIS — E1151 Type 2 diabetes mellitus with diabetic peripheral angiopathy without gangrene: Secondary | ICD-10-CM

## 2021-10-21 DIAGNOSIS — M7751 Other enthesopathy of right foot: Secondary | ICD-10-CM | POA: Diagnosis not present

## 2021-10-21 NOTE — Progress Notes (Signed)
  Subjective:  Patient ID: Amy Mccann, female    DOB: 1940-03-18,  MRN: [id number]  Chief Complaint  Patient presents with   Diabetes    At risk diabetic foot care, painful pre-ulcerative callus right 3rd toe    81 y.o. female presents with the above complaint. History confirmed with patient.  Patient presents today for painful hyperkeratotic lesion present at the right third toe.  She states that there is a hard callus that is developed in between her third and fourth toe.  She has had it shaved down in the past.  She is using a gel toe spacer/toe sleeve in between the third and fourth toe to prevent pressure from irritating however it is still regrown and causing her pain with walking and wearing shoes.  She does have a history of diabetes type 2 with polyneuropathy.  Had nail care performed in mid July.  Objective:  Physical Exam: warm, good capillary refill, nail exam onychomycosis of the toenails and hyperkeratotic lesion noted at the lateral aspect of the third toe proximal interphalangeal joint right foot., tender with palpation DP pulses palpable, PT pulses palpable, and protective sensation absent Left Foot: normal exam, no swelling, tenderness, instability; ligaments intact, full range of motion of all ankle/foot joints  Right Foot: Hammertoe deformity of the third and fourth toe on the right foot, lateral deviation of the third toe which is abutting the fourth toe and prominence of the proximal interphalangeal joint is palpable.   No images are attached to the encounter.  Assessment:   1. Callus of toe   2. Diabetes mellitus type 2 with peripheral artery disease ([place])   3. Bone spur of toe of right foot      Plan:  Patient was evaluated and treated and all questions answered.  -Examined patient. -pared callus lateral right third toe to level of healthy skin with 15 blade scalpel.  Applied a silicone toe cap.  -I discussed with the patient possibility of performing a  minor toe procedure to remove some of the bone present at the lateral aspect of the third toe PIPJ to prevent the recurrence of her hyperkeratotic lesion.  We will see how the callus does after being pared down at this visit. -We will plan for nail care at next visit. -Answered all patient questions -Patient to return  in 6 weeks for at risk foot care -Patient advised to call the office if any problems or questions arise in the meantime.  Return in about 6 weeks (around 12/02/2021) for [facility].         Everitt Amber, DPM [facility]

## 2021-10-21 NOTE — Telephone Encounter (Signed)
Patient called and stated that she would like to be seen before 6 weeks to get her heel spur taken out.   Should I r/s her for a sooner appointment

## 2021-10-22 NOTE — Telephone Encounter (Signed)
Noted, thanks!

## 2021-10-23 ENCOUNTER — Ambulatory Visit: Payer: Medicare HMO | Admitting: Oncology

## 2021-10-23 NOTE — Progress Notes (Deleted)
Office Visit Note  Patient: Amy Mccann             Date of Birth: 06-28-1940           MRN: [id number]             PCP: Rochel Brome, MD Referring: Rochel Brome, MD Visit Date: 10/29/2021 Occupation: @GUAROCC @  Subjective:  No chief complaint on file.   History of Present Illness: Amy Mccann is a 81 y.o. female ***   Activities of Daily Living:  Patient reports morning stiffness for *** {minute/hour:19697}.   Patient {ACTIONS;DENIES/REPORTS:21021675::"Denies"} nocturnal pain.  Difficulty dressing/grooming: {ACTIONS;DENIES/REPORTS:21021675::"Denies"} Difficulty climbing stairs: {ACTIONS;DENIES/REPORTS:21021675::"Denies"} Difficulty getting out of chair: {ACTIONS;DENIES/REPORTS:21021675::"Denies"} Difficulty using hands for taps, buttons, cutlery, and/or writing: {ACTIONS;DENIES/REPORTS:21021675::"Denies"}  No Rheumatology ROS completed.   PMFS History:  Patient Active Problem List   Diagnosis Date Noted  . Pain in finger of both hands 05/21/2021  . Pain of upper abdomen 05/21/2021  . Chronic midline low back pain without sciatica 01/06/2021  . Primary osteoarthritis of both hands 01/06/2021  . Hallux valgus with bunions 07/04/2019  . Low back pain with sciatica 07/03/2019  . Cervicalgia 07/03/2019  . Class 1 obesity due to excess calories with serious comorbidity and body mass index (BMI) of 32.0 to 32.9 in adult 07/03/2019  . Generalized anxiety disorder 07/03/2019  . Osteopenia 07/03/2019  . Hypertension complicating diabetes ([place]) 04/05/2019  . GERD without esophagitis 04/05/2019  . Mixed hyperlipidemia 04/05/2019  . Lumbar spondylosis 02/21/2019  . Postoperative hypothyroidism 10/02/2015  . Type 2 diabetes mellitus with polyneuropathy ([place]) 06/27/2013  . History of left breast cancer 06/27/2013    Past Medical History:  Diagnosis Date  . Age-related osteoporosis without current pathological fracture 07/03/2019  . Cancer [facility]) 1990   Left Breast Cancer    . Cancer [facility]) 2017   Thyroid Cancer  . Cervicalgia 07/03/2019  . Diabetes mellitus without complication ([place])   . Generalized anxiety disorder   . GERD (gastroesophageal reflux disease)   . Hypertension   . Low back pain with sciatica 07/03/2019  . Obesity due to excess calories 07/03/2019  . S/P repair of paraesophageal hernia 10/20/2013  . S/P TKR (total knee replacement) 07/25/2012    Family History  Problem Relation Age of Onset  . Leukemia Brother    Past Surgical History:  Procedure Laterality Date  . [place] STUDY N/A 08/07/2013   Procedure: [place] STUDY;  Surgeon: Missy Sabins, MD;  Location: [facility];  Service: Endoscopy;  Laterality: N/A;  . APPENDECTOMY  03/17/2016  . ESOPHAGEAL MANOMETRY N/A 08/07/2013   Procedure: ESOPHAGEAL MANOMETRY (EM);  Surgeon: Missy Sabins, MD;  Location: [facility] ENDOSCOPY;  Service: Endoscopy;  Laterality: N/A;  . JOINT REPLACEMENT    . MASTECTOMY Left 1990  . THYROIDECTOMY Bilateral 08/2015   Social History   Social History Narrative  . Not on file   Immunization History  Administered Date(s) Administered  . Fluad Quad(high Dose 65+) 11/03/2019, 10/28/2020  . PFIZER(Purple Top)SARS-COV-2 Vaccination 04/07/2019, 05/05/2019, 12/09/2019, 07/07/2020  . Pension scheme manager 56yr & up 02/19/2021  . Pneumococcal Conjugate-13 03/17/2016  . Pneumococcal Polysaccharide-23 08/28/2013  . Tdap 11/04/2016  . Zoster, Live 05/18/2014     Objective: Vital Signs: There were no vitals taken for this visit.   Physical Exam   Musculoskeletal Exam: ***  CDAI Exam: CDAI Score: -- Patient Global: --; Provider Global: -- Swollen: --; Tender: -- Joint Exam 10/29/2021  No joint exam has been documented for this visit   There is currently no information documented on the homunculus. Go to the Rheumatology activity and complete the homunculus joint exam.  Investigation: No additional findings.  Imaging: No results  found.  Recent Labs: Lab Results  Component Value Date   WBC 8.6 08/29/2021   HGB 13.9 08/29/2021   PLT 285 08/29/2021   NA 136 08/29/2021   K 4.4 08/29/2021   CL 97 08/29/2021   CO2 22 08/29/2021   GLUCOSE 119 (H) 08/29/2021   BUN 12 08/29/2021   CREATININE 0.92 08/29/2021   BILITOT 0.4 08/29/2021   ALKPHOS 110 08/29/2021   AST 20 08/29/2021   ALT 21 08/29/2021   PROT 6.9 08/29/2021   ALBUMIN 4.4 08/29/2021   CALCIUM 9.8 08/29/2021   GFRAA 70 03/05/2020    Speciality Comments: No specialty comments available.  Procedures:  No procedures performed Allergies: Ergocalciferol, Codeine, and Penicillins   Assessment / Plan:     Visit Diagnoses: Pain in finger of both hands - 05/15/21: anti-CCP 4, ESR 27, RF<10, uric acid 5.2, CRP 1, TSH 1.770  Primary osteoarthritis of both hands  Lumbar spondylosis  Osteopenia of lumbar spine  Hypertension complicating diabetes ([facility])  GERD without esophagitis  Type 2 diabetes mellitus with polyneuropathy ([facility])  Postoperative hypothyroidism  Mixed hyperlipidemia  History of left breast cancer  Generalized anxiety disorder  Class 1 obesity due to excess calories with serious comorbidity and body mass index (BMI) of 32.0 to 32.9 in adult  Orders: No orders of the defined types were placed in this encounter.  No orders of the defined types were placed in this encounter.   Face-to-face time spent with patient was *** minutes. Greater than 50% of time was spent in counseling and coordination of care.  Follow-Up Instructions: No follow-ups on file.   Ofilia Neas, PA-C  Note - This record has been created using Dragon software.  Chart creation errors have been sought, but may not always  have been located. Such creation errors do not reflect on  the standard of medical care.

## 2021-10-27 ENCOUNTER — Telehealth: Payer: Self-pay

## 2021-10-27 NOTE — Telephone Encounter (Signed)
Amy Mccann called with concerns about her ongoing diarrhea and intermittent nausea.  Her symptoms have been going on for sometime and she has been under the care of GI.  She was instructed to give Dr. Lyda Jester a call for follow-up.  If she wants her to be seen in our office then she was advised to call us back and we will schedule.

## 2021-10-29 ENCOUNTER — Encounter: Payer: Medicare HMO | Admitting: Rheumatology

## 2021-10-29 DIAGNOSIS — I152 Hypertension secondary to endocrine disorders: Secondary | ICD-10-CM

## 2021-10-29 DIAGNOSIS — F411 Generalized anxiety disorder: Secondary | ICD-10-CM

## 2021-10-29 DIAGNOSIS — E782 Mixed hyperlipidemia: Secondary | ICD-10-CM

## 2021-10-29 DIAGNOSIS — E6609 Other obesity due to excess calories: Secondary | ICD-10-CM

## 2021-10-29 DIAGNOSIS — E890 Postprocedural hypothyroidism: Secondary | ICD-10-CM

## 2021-10-29 DIAGNOSIS — M79644 Pain in right finger(s): Secondary | ICD-10-CM

## 2021-10-29 DIAGNOSIS — K219 Gastro-esophageal reflux disease without esophagitis: Secondary | ICD-10-CM

## 2021-10-29 DIAGNOSIS — Z853 Personal history of malignant neoplasm of breast: Secondary | ICD-10-CM

## 2021-10-29 DIAGNOSIS — M19041 Primary osteoarthritis, right hand: Secondary | ICD-10-CM

## 2021-10-29 DIAGNOSIS — M8588 Other specified disorders of bone density and structure, other site: Secondary | ICD-10-CM

## 2021-10-29 DIAGNOSIS — E1142 Type 2 diabetes mellitus with diabetic polyneuropathy: Secondary | ICD-10-CM

## 2021-10-29 DIAGNOSIS — M47816 Spondylosis without myelopathy or radiculopathy, lumbar region: Secondary | ICD-10-CM

## 2021-11-04 ENCOUNTER — Ambulatory Visit: Payer: Medicare HMO | Admitting: Podiatry

## 2021-11-05 DIAGNOSIS — R194 Change in bowel habit: Secondary | ICD-10-CM | POA: Diagnosis not present

## 2021-11-05 DIAGNOSIS — K219 Gastro-esophageal reflux disease without esophagitis: Secondary | ICD-10-CM | POA: Diagnosis not present

## 2021-11-05 DIAGNOSIS — R110 Nausea: Secondary | ICD-10-CM | POA: Diagnosis not present

## 2021-11-06 NOTE — Progress Notes (Signed)
Amy Mccann  [address] [place],  [place]  [address] [phone]  Clinic Day:  11/13/2021  Referring physician: Rochel Brome, MD   ASSESSMENT & PLAN:  History of left breast cancer Most recent mammogram benign. No evidence of disease recurrence. She will repeat mammogram next July.    Age-related osteopenia without current pathological fracture Bone density exam reveals continued osteopenia. PCP is managing this for her. She will repeat imaging in 2 years.    Papillary thyroid cancer in July 2017 She had a total thyroidectomy and is now hypothyroid. Last TSH was 1.77  in April of 2023. This is being managed by her endocrinologist.    I will see her back in 1 year for repeat examination. Dr.Cox checks her labs regularly. Her surgeon schedules her yearly mammograms. She will be due for her next bone density scan in 1 year.  I discussed the assessment and treatment plan with the patient.  The patient was provided an opportunity to ask questions and all were answered.  The patient agreed with the plan and demonstrated an understanding of the instructions.  The patient was advised to call back if the symptoms worsen or if the condition fails to improve as anticipated.    I provided 20 minutes of face-to-face time during this this encounter and > 50% was spent counseling as documented under my assessment and plan.   Derwood Kaplan, MD [facility] AT [facility] [address] [place] [address] Dept: [phone] Dept Fax: [phone]    I have reviewed this report as typed by the medical scribe, and it is complete and accurate.  Derwood Kaplan, MD   10/27/20236:08 PM  CHIEF COMPLAINT:  CC: history of left breast cancer  Current Treatment:  surveillance    HISTORY OF PRESENT ILLNESS:   Oncology History   No history exists.   Amy Mccann is an 81 year old woman with a history of  stage III hormone receptor positive left breast cancer diagnosed in  October 1990. She was treated with left modified radical mastectomy and axillary dissection with negative nodes. She received adjuvant chemotherapy with 5 fluorouracil, doxorubicin and cyclophosphamide, followed by adjuvant radiation and 5 years of tamoxifen. She has never had evidence of recurrence.   She was found to have papillary thyroid cancer in July 2017 treated with total thyroidectomy and postoperative I 131. 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 She also had papillary microcarcinoma, follicular variant, which was circumscribed and only 1 mm in size. There was also a possible additional focus of papillary carcinoma associated with an intratympanic psammoma body. She ois seen by Dr. Tamala Julian, her endocrinologist in [facility] for follow up of thyroid cancer.      INTERVAL HISTORY:  Amy Mccann is seen here today with a history of left breast cancer. She states she is doing well and denies any complaints. She notes she has been having arthritis problems. She is scheduled to see a specialist in December. She takes Tylenol or Ibuprofen to assist with pain. She has swelling of her DIPs with Heberden's nodes. She states she has some trouble swallowing larger capsules. She reports having diarrhea and having stomach issues. She was in the hospital 09/15/21, and completed a CAT scan. She has EGD scheduled with Dr.Misenheimer. Dr.Cox checks her thyroid and labs regularly, her TSH was 1.77 in April. She reports the dicyclomine makes her  sleepy but does help her stomach symptoms. She also continues taking ondansetron for nausea. She has mild osteopenia from her DEXA of September 2022. I have recommended her to take Vitamin D and calcium supplements and she will resume that.   She denies fever, chills, night sweats, or other signs of infection. She denies  cardiorespiratory and gastrointestinal issues. She  denies pain. Her appetite is good. Her weight has remained stable at 158.   REVIEW OF SYSTEMS:  Review of Systems  Constitutional: Negative.   HENT:  Negative.    Eyes: Negative.   Respiratory: Negative.    Cardiovascular: Negative.   Gastrointestinal: Negative.   Endocrine: Negative.   Genitourinary: Negative.    Musculoskeletal: Negative.   Skin: Negative.   Neurological: Negative.   Hematological: Negative.   Psychiatric/Behavioral: Negative.      VITALS:  Blood pressure 135/70, pulse 75, temperature 97.8 F (36.6 C), temperature source Oral, resp. rate 18, height 4' 10.5" (1.486 m), weight 158 lb 4.8 oz (71.8 kg), SpO2 98 %.  Wt Readings from Last 3 Encounters:  11/13/21 158 lb 4.8 oz (71.8 kg)  08/29/21 159 lb (72.1 kg)  05/15/21 164 lb 3.2 oz (74.5 kg)    Body mass index is 32.52 kg/m.  Performance status (ECOG): 0 - Asymptomatic  PHYSICAL EXAM:  Physical Exam Constitutional:      General: She is not in acute distress.    Appearance: Normal appearance. She is normal weight.  HENT:     Head: Normocephalic and atraumatic.  Eyes:     General: No scleral icterus.    Extraocular Movements: Extraocular movements intact.     Conjunctiva/sclera: Conjunctivae normal.     Pupils: Pupils are equal, round, and reactive to light.  Cardiovascular:     Rate and Rhythm: Normal rate and regular rhythm.     Pulses: Normal pulses.     Heart sounds: Normal heart sounds. No murmur heard.    No friction rub. No gallop.  Pulmonary:     Effort: Pulmonary effort is normal. No respiratory distress.     Breath sounds: Normal breath sounds.  Chest:     Comments: Left mastectomy is negative but she has tight scar of her left axillary tendons. Right breast has a couple of large brown moles which appear begin. No masses.  Abdominal:     General: Bowel sounds are normal. There is no distension.     Palpations: Abdomen is soft. There is  no hepatomegaly, splenomegaly or mass.     Tenderness: There is no abdominal tenderness.     Comments: Mild epegastric tenderness.   Musculoskeletal:        General: Normal range of motion.     Cervical back: Normal range of motion and neck supple.     Right lower leg: No edema.     Left lower leg: No edema.  Lymphadenopathy:     Cervical: No cervical adenopathy.  Skin:    General: Skin is warm and dry.  Neurological:     General: No focal deficit present.     Mental Status: She is alert and oriented to person, place, and time. Mental status is at baseline.  Psychiatric:        Mood and Affect: Mood normal.        Behavior: Behavior normal.        Thought Content: Thought content normal.        Judgment: Judgment normal.    LABS:  Latest Ref Rng & Units 08/29/2021    9:52 AM 05/15/2021   10:06 AM 01/06/2021   10:23 AM  CBC  WBC 3.4 - 10.8 x10E3/uL 8.6  9.9  9.1   Hemoglobin 11.1 - 15.9 g/dL 13.9  13.8  13.4   Hematocrit 34.0 - 46.6 % 39.9  40.9  39.8   Platelets 150 - 450 x10E3/uL 285  278  266       Latest Ref Rng & Units 08/29/2021    9:52 AM 05/15/2021   10:06 AM 01/06/2021   10:23 AM  CMP  Glucose 70 - 99 mg/dL 119  125  116   BUN 8 - 27 mg/dL '12  13  15   '$ Creatinine 0.57 - 1.00 mg/dL 0.92  0.94  0.98   Sodium 134 - 144 mmol/L 136  138  136   Potassium 3.5 - 5.2 mmol/L 4.4  4.4  4.2   Chloride 96 - 106 mmol/L 97  101  101   CO2 20 - 29 mmol/L '22  18  21   '$ Calcium 8.7 - 10.3 mg/dL 9.8  9.5  9.3   Total Protein 6.0 - 8.5 g/dL 6.9  7.3  6.8   Total Bilirubin 0.0 - 1.2 mg/dL 0.4  0.4  0.4   Alkaline Phos 44 - 121 IU/L 110  107  101   AST 0 - 40 IU/L '20  21  15   '$ ALT 0 - 32 IU/L '21  21  15      '$ No results found for: "CEA1", "CEA" / No results found for: "CEA1", "CEA" No results found for: "PSA1" No results found for: "MAU633" No results found for: "CAN125"  No results found for: "TOTALPROTELP", "ALBUMINELP", "A1GS", "A2GS", "BETS", "BETA2SER", "GAMS",  "MSPIKE", "SPEI" No results found for: "TIBC", "FERRITIN", "IRONPCTSAT" No results found for: "LDH"   STUDIES:   EXAM:09/23/21 DIGITAL SCREENING UNILATERAL RIGHT MAMMOGRAM WITH CAD AND TOMO FINDINGS: There are no findings suspicious for malignancy  IMPRESSION: No mammographic evidence of malignancy.   RECOMMENDATION: Screening mammogram in one year  BI-RADS CATEGORY 1 : Negative.  EXAM:05/03/21 ULTRASOUND ABDOMEN LIMITED RIGHT UPPER QUADRANT IMPRESSION: Fatty liver, otherwise unremarkable right upper quadrant ultrasound.   Liver: There is diffuse increased liver echogenicity most commonly seen in the setting of fatty infiltration. Superimposed inflammation or fibrosis is not excluded. Clinical correlation is recommended. Portal vein is patent on color Doppler imaging with normal direction of blood flow towards the liver.   Allergies:  Allergies  Allergen Reactions   Ergocalciferol Other (See Comments)    somnolence   Codeine Itching and Rash   Penicillins Rash    Current Medications: Current Outpatient Medications  Medication Sig Dispense Refill   Accu-Chek Softclix Lancets lancets USE AS DIRECTED 100 each 5   ascorbic acid (VITAMIN C) 500 MG tablet Take by mouth.     dicyclomine (BENTYL) 20 MG tablet Take 1 tablet (20 mg total) by mouth 4 (four) times daily -  before meals and at bedtime. 120 tablet 4   esomeprazole (NEXIUM) 40 MG capsule Take 1 capsule (40 mg total) by mouth 2 (two) times daily before a meal. 180 capsule 1   glucose blood (ACCU-CHEK AVIVA PLUS) test strip USE TO CHECK FASTING BLOOD SUGAR ONCE DAILY AS DIRECTED` 100 strip 3   levothyroxine (SYNTHROID) 112 MCG tablet TAKE 1 TABLET(112 MCG) BY MOUTH DAILY BEFORE BREAKFAST 90 tablet 1   losartan (COZAAR) 25 MG tablet TAKE 1 TABLET BY MOUTH DAILY 90  tablet 1   ondansetron (ZOFRAN-ODT) 8 MG disintegrating tablet DISSOLVE 1/2 TABLET(4 MG) ON THE TONGUE EVERY 8 HOURS AS NEEDED FOR NAUSEA OR VOMITING 30 tablet 1    polyethylene glycol powder (GLYCOLAX/MIRALAX) 17 GM/SCOOP powder Take by mouth.     psyllium (METAMUCIL) 58.6 % packet Take by mouth.     rosuvastatin (CRESTOR) 5 MG tablet TAKE 1 TABLET BY MOUTH DAILY 90 tablet 1   No current facility-administered medications for this visit.      I,Gabriella Ballesteros,acting as a scribe for Derwood Kaplan, MD.,have documented all relevant documentation on the behalf of Derwood Kaplan, MD,as directed by  Derwood Kaplan, MD while in the presence of Derwood Kaplan, MD.

## 2021-11-07 ENCOUNTER — Ambulatory Visit: Payer: Medicare HMO | Admitting: Rheumatology

## 2021-11-13 ENCOUNTER — Telehealth: Payer: Self-pay | Admitting: Oncology

## 2021-11-13 ENCOUNTER — Inpatient Hospital Stay: Payer: Medicare HMO | Attending: Oncology | Admitting: Oncology

## 2021-11-13 ENCOUNTER — Encounter: Payer: Self-pay | Admitting: Oncology

## 2021-11-13 VITALS — BP 135/70 | HR 75 | Temp 97.8°F | Resp 18 | Ht 58.5 in | Wt 158.3 lb

## 2021-11-13 DIAGNOSIS — Z853 Personal history of malignant neoplasm of breast: Secondary | ICD-10-CM | POA: Diagnosis not present

## 2021-11-13 NOTE — Telephone Encounter (Signed)
11/13/21 next appt scheduled and confirmed with patient

## 2021-11-28 DIAGNOSIS — Z880 Allergy status to penicillin: Secondary | ICD-10-CM | POA: Diagnosis not present

## 2021-11-28 DIAGNOSIS — Z885 Allergy status to narcotic agent status: Secondary | ICD-10-CM | POA: Diagnosis not present

## 2021-11-28 DIAGNOSIS — K449 Diaphragmatic hernia without obstruction or gangrene: Secondary | ICD-10-CM | POA: Diagnosis not present

## 2021-11-28 DIAGNOSIS — R110 Nausea: Secondary | ICD-10-CM | POA: Diagnosis not present

## 2021-11-28 DIAGNOSIS — E119 Type 2 diabetes mellitus without complications: Secondary | ICD-10-CM | POA: Diagnosis not present

## 2021-11-28 DIAGNOSIS — I10 Essential (primary) hypertension: Secondary | ICD-10-CM | POA: Diagnosis not present

## 2021-11-28 DIAGNOSIS — Z853 Personal history of malignant neoplasm of breast: Secondary | ICD-10-CM | POA: Diagnosis not present

## 2021-11-28 DIAGNOSIS — K219 Gastro-esophageal reflux disease without esophagitis: Secondary | ICD-10-CM | POA: Diagnosis not present

## 2021-11-28 DIAGNOSIS — F319 Bipolar disorder, unspecified: Secondary | ICD-10-CM | POA: Diagnosis not present

## 2021-11-28 DIAGNOSIS — K209 Esophagitis, unspecified without bleeding: Secondary | ICD-10-CM | POA: Diagnosis not present

## 2021-12-01 ENCOUNTER — Ambulatory Visit: Payer: Medicare HMO | Admitting: Podiatry

## 2021-12-02 ENCOUNTER — Ambulatory Visit: Payer: Medicare HMO | Admitting: Podiatry

## 2021-12-02 ENCOUNTER — Ambulatory Visit: Payer: Medicare HMO | Admitting: Rheumatology

## 2021-12-04 ENCOUNTER — Ambulatory Visit: Payer: Medicare HMO | Admitting: Podiatry

## 2021-12-19 ENCOUNTER — Other Ambulatory Visit: Payer: Self-pay

## 2021-12-19 DIAGNOSIS — E119 Type 2 diabetes mellitus without complications: Secondary | ICD-10-CM | POA: Diagnosis not present

## 2021-12-19 DIAGNOSIS — M79674 Pain in right toe(s): Secondary | ICD-10-CM | POA: Diagnosis not present

## 2021-12-19 DIAGNOSIS — L84 Corns and callosities: Secondary | ICD-10-CM | POA: Diagnosis not present

## 2021-12-19 MED ORDER — ROSUVASTATIN CALCIUM 5 MG PO TABS: 5.0000 mg | ORAL_TABLET | Freq: Every day | ORAL | 0 refills | Status: DC

## 2022-01-02 ENCOUNTER — Other Ambulatory Visit: Payer: Self-pay | Admitting: Physician Assistant

## 2022-01-07 NOTE — Progress Notes (Signed)
Office Visit Note  Patient: Amy Mccann             Date of Birth: 1940/08/14           MRN: [id number]             PCP: Rochel Brome, MD Referring: Rochel Brome, MD Visit Date: 01/14/2022 Occupation: _0 @  Subjective:  Pain in both hands  History of Present Illness: Amy Mccann is a 81 y.o. female seen in consultation per request of her PCP.  According to the patient the arthritis in her hands that started when she was in high school which has been gradually getting worse over the years.  She states in the last year her hands have been much worse.  She has been trying to soak her hands in the Epsom salt and takes Tylenol.  She notices swelling in her hands.  She states she had lower back pain for many years and had seen Dr. Ronnald Ramp in the past.  No surgery was advised.  She uses a back brace.  She has some stiffness in her cervical spine which is manageable.  She had a right total knee replacement in 2014 due to osteoarthritis which is still causing some discomfort.  She ambulates with the help of a cane. She denies any discomfort in her left knee.  She states she has a corn on her right third toe for which she has seen a podiatrist and she gets it shaved often.  It still causes discomfort.  None of the other joints are painful or swollen.  There is no family history of autoimmune disease.  She has 2 brothers who have osteoarthritis.  She is gravida 0.  She lives independently in an apartment.  Activities of Daily Living:  Patient reports morning stiffness for several hours.   Patient Reports nocturnal pain.  Difficulty dressing/grooming: Denies Difficulty climbing stairs: Denies Difficulty getting out of chair: Denies Difficulty using hands for taps, buttons, cutlery, and/or writing: Reports  Review of Systems  Constitutional:  Positive for fatigue.  HENT:  Negative for mouth sores and mouth dryness.   Eyes:  Negative for dryness.  Respiratory:  Negative for difficulty  breathing.   Cardiovascular:  Negative for chest pain and palpitations.  Gastrointestinal:  Positive for constipation. Negative for blood in stool and diarrhea.  Endocrine: Negative for increased urination.  Genitourinary:  Negative for involuntary urination.  Musculoskeletal:  Positive for joint pain, gait problem, joint pain, joint swelling, myalgias, muscle weakness, morning stiffness, muscle tenderness and myalgias.  Skin:  Negative for color change, rash, hair loss and sensitivity to sunlight.  Allergic/Immunologic: Negative for susceptible to infections.  Neurological:  Negative for dizziness and headaches.  Hematological:  Negative for swollen glands.  Psychiatric/Behavioral:  Negative for depressed mood and sleep disturbance. The patient is not nervous/anxious.     PMFS History:  Patient Active Problem List   Diagnosis Date Noted   Pain in finger of both hands 05/21/2021   Pain of upper abdomen 05/21/2021   Chronic midline low back pain without sciatica 01/06/2021   Primary osteoarthritis of both hands 01/06/2021   Hallux valgus with bunions 07/04/2019   Low back pain with sciatica 07/03/2019   Cervicalgia 07/03/2019   Class 1 obesity due to excess calories with serious comorbidity and body mass index (BMI) of 32.0 to 32.9 in adult 07/03/2019   Generalized anxiety disorder 07/03/2019   Osteopenia 07/03/2019   Hypertension complicating diabetes ([place]) 04/05/2019   GERD  without esophagitis 04/05/2019   Mixed hyperlipidemia 04/05/2019   Lumbar spondylosis 02/21/2019   Postoperative hypothyroidism 10/02/2015   Type 2 diabetes mellitus with polyneuropathy ([place]) 06/27/2013   History of left breast cancer 06/27/2013    Past Medical History:  Diagnosis Date   Age-related osteoporosis without current pathological fracture 07/03/2019   Cancer ([place]) 1990   Left Breast Cancer    Cancer ([place]) 2017   Thyroid Cancer   Cervicalgia 07/03/2019   Diabetes mellitus without complication  (HCC)    Generalized anxiety disorder    GERD (gastroesophageal reflux disease)    Hypertension    Low back pain with sciatica 07/03/2019   Obesity due to excess calories 07/03/2019   S/P repair of paraesophageal hernia 10/20/2013   S/P TKR (total knee replacement) 07/25/2012    Family History  Problem Relation Age of Onset   Leukemia Brother    Past Surgical History:  Procedure Laterality Date   34 HOUR [place] STUDY N/A 08/07/2013   Procedure: 24 HOUR [place] STUDY;  Surgeon: Missy Sabins, MD;  Location: [facility];  Service: Endoscopy;  Laterality: N/A;   APPENDECTOMY  03/17/2016   ESOPHAGEAL MANOMETRY N/A 08/07/2013   Procedure: ESOPHAGEAL MANOMETRY (EM);  Surgeon: Missy Sabins, MD;  Location: [facility];  Service: Endoscopy;  Laterality: N/A;   JOINT REPLACEMENT Right    right knee   MASTECTOMY Left 1990   THYROIDECTOMY Bilateral 08/2015   Social History   Social History Narrative   Not on file   Immunization History  Administered Date(s) Administered   Fluad Quad(high Dose 65+) 11/03/2019, 10/28/2020   PFIZER(Purple Top)SARS-COV-2 Vaccination 04/07/2019, 05/05/2019, 12/09/2019, 07/07/2020   Pfizer Covid-19 Vaccine Bivalent Booster 67yr & up 02/19/2021   Pneumococcal Conjugate-13 03/17/2016   Pneumococcal Polysaccharide-23 08/28/2013   Tdap 11/04/2016   Zoster, Live 05/18/2014     Objective: Vital Signs: BP 132/74 (BP Location: Right Arm, Patient Position: Sitting, Cuff Size: Normal)   Pulse 80   Resp 15   Ht _0  (1.499 m)   Wt 159 lb 6.4 oz (72.3 kg)   BMI 32.19 kg/m    Physical Exam Vitals and nursing note reviewed.  Constitutional:      Appearance: She is well-developed.  HENT:     Head: Normocephalic and atraumatic.  Eyes:     Conjunctiva/sclera: Conjunctivae normal.  Cardiovascular:     Rate and Rhythm: Normal rate and regular rhythm.     Heart sounds: Normal heart sounds.  Pulmonary:     Effort: Pulmonary effort is normal.     Breath sounds: Normal  breath sounds.  Abdominal:     General: Bowel sounds are normal.     Palpations: Abdomen is soft.  Musculoskeletal:     Cervical back: Normal range of motion.  Lymphadenopathy:     Cervical: No cervical adenopathy.  Skin:    General: Skin is warm and dry.     Capillary Refill: Capillary refill takes less than 2 seconds.  Neurological:     Mental Status: She is alert and oriented to person, place, and time.  Psychiatric:        Behavior: Behavior normal.      Musculoskeletal Exam: She has some limitation with lateral rotation of the cervical spine.  She had limited painful range of motion of the lumbar spine.  She was wearing a back brace.  She ambulates with the help of a cane.  Shoulder joints and elbow joints in good range of motion.  Bilateral  PIP and DIP thickening with incomplete fist formation and bilateral CMC thickening was noted.  No synovitis was noted.  Hip joints and knee joints were in good range of motion.  Right knee joint was replaced without any warmth swelling or effusion.  There was no tenderness over ankles.  She had bilateral PIP and DIP thickening in her feet consistent with osteoarthritis.  A callus formation was noted on the right third toe.  No synovitis was noted.  CDAI Exam: CDAI Score: -- Patient Global: --; Provider Global: -- Swollen: --; Tender: -- Joint Exam 01/14/2022   No joint exam has been documented for this visit   There is currently no information documented on the homunculus. Go to the Rheumatology activity and complete the homunculus joint exam.  Investigation: No additional findings.  Imaging: No results found.  Recent Labs: Lab Results  Component Value Date   WBC 8.6 08/29/2021   HGB 13.9 08/29/2021   PLT 285 08/29/2021   NA 136 08/29/2021   K 4.4 08/29/2021   CL 97 08/29/2021   CO2 22 08/29/2021   GLUCOSE 119 (H) 08/29/2021   BUN 12 08/29/2021   CREATININE 0.92 08/29/2021   BILITOT 0.4 08/29/2021   ALKPHOS 110 08/29/2021    AST 20 08/29/2021   ALT 21 08/29/2021   PROT 6.9 08/29/2021   ALBUMIN 4.4 08/29/2021   CALCIUM 9.8 08/29/2021   GFRAA 70 03/05/2020    Speciality Comments: No specialty comments available.  Procedures:  No procedures performed Allergies: Ergocalciferol, Codeine, and Penicillins   Assessment / Plan:     Visit Diagnoses: Pain in both hands -patient complains of pain and discomfort in the bilateral hands for many years which has been progressively getting worse.  All autoimmune work-up was negative.  She had no synovitis on examination.  She had bilateral PIP and DIP thickening consistent with osteoarthritis.  She also had [place] prominence.  She has difficulty making a complete fist.  She gives history of significant morning stiffness.  She will benefit from PT and OT.  I will make a referral.  05/15/21: Anti-CCP 4, ESR 27, RF<10, uric acid 5.1, CRP 1, TSH 1.770 - Plan: XR Hand 2 View Right, XR Hand 2 View Left.  X-rays of the bilateral hands showed severe erosive osteoarthritis.  X-ray findings were reviewed with the patient.  A handout on hand exercises was given.  I will refer her to PT and OT.  Primary osteoarthritis of both hands-clinical and radiographic findings are consistent with osteoarthritis.  Joint protection muscle strengthening was discussed.  Primary osteoarthritis of both feet-she has osteoarthritis in her bilateral feet.  Overcrowding of toes with PIP and DIP thickening was noted.  No synovitis was noted.  She also has a callus formation over her right third toe.  She is seeing a podiatrist.  Proper fitting shoes were advised.  Cervicalgia-she gives history of chronic discomfort in her cervical spine.  She has some limitation with range of motion of the cervical spine.  She had no radiculopathy.  Lumbar spondylosis-she has chronic lower back pain.  She states she was evaluated by Dr. Ronnald Ramp in the past.  She was advised not to undergo surgery.  She uses a back brace.  She was in a  lot of discomfort.  A handout on back exercises was given.  Osteopenia of lumbar spine-she had a DEXA scan on September 02, 2020 which showed a T score of -1.5.  She has significant thoracic kyphosis.  Of calcium rich  diet and exercise was emphasized.  Other medical problems are listed as follows:  Type 2 diabetes mellitus with polyneuropathy ([place])  Hypertension complicating diabetes ([place])  Mixed hyperlipidemia  History of left breast cancer - 1990, mastectomy, chemotherapy and radiation therapy.  Followed by oncologist.  Postoperative hypothyroidism  GERD without esophagitis  Generalized anxiety disorder  Class 1 obesity due to excess calories with serious comorbidity and body mass index (BMI) of 32.0 to 32.9 in adult  Orders: Orders Placed This Encounter  Procedures   XR Hand 2 View Right   XR Hand 2 View Left   No orders of the defined types were placed in this encounter.  .  Follow-Up Instructions: Return for Osteoarthritis.   Bo Merino, MD  Note - This record has been created using Editor, commissioning.  Chart creation errors have been sought, but may not always  have been located. Such creation errors do not reflect on  the standard of medical care.,

## 2022-01-12 DIAGNOSIS — G8929 Other chronic pain: Secondary | ICD-10-CM | POA: Diagnosis not present

## 2022-01-12 DIAGNOSIS — M79674 Pain in right toe(s): Secondary | ICD-10-CM | POA: Diagnosis not present

## 2022-01-14 ENCOUNTER — Encounter: Payer: Self-pay | Admitting: Rheumatology

## 2022-01-14 ENCOUNTER — Ambulatory Visit: Payer: Medicare HMO

## 2022-01-14 ENCOUNTER — Ambulatory Visit: Payer: Medicare HMO | Attending: Rheumatology | Admitting: Rheumatology

## 2022-01-14 ENCOUNTER — Ambulatory Visit (INDEPENDENT_AMBULATORY_CARE_PROVIDER_SITE_OTHER): Payer: Medicare HMO

## 2022-01-14 VITALS — BP 132/74 | HR 80 | Resp 15 | Ht 59.0 in | Wt 159.4 lb

## 2022-01-14 DIAGNOSIS — I152 Hypertension secondary to endocrine disorders: Secondary | ICD-10-CM

## 2022-01-14 DIAGNOSIS — M8588 Other specified disorders of bone density and structure, other site: Secondary | ICD-10-CM | POA: Diagnosis not present

## 2022-01-14 DIAGNOSIS — M19042 Primary osteoarthritis, left hand: Secondary | ICD-10-CM

## 2022-01-14 DIAGNOSIS — M542 Cervicalgia: Secondary | ICD-10-CM

## 2022-01-14 DIAGNOSIS — K219 Gastro-esophageal reflux disease without esophagitis: Secondary | ICD-10-CM

## 2022-01-14 DIAGNOSIS — Z6832 Body mass index (BMI) 32.0-32.9, adult: Secondary | ICD-10-CM

## 2022-01-14 DIAGNOSIS — F411 Generalized anxiety disorder: Secondary | ICD-10-CM

## 2022-01-14 DIAGNOSIS — E782 Mixed hyperlipidemia: Secondary | ICD-10-CM | POA: Diagnosis not present

## 2022-01-14 DIAGNOSIS — E1159 Type 2 diabetes mellitus with other circulatory complications: Secondary | ICD-10-CM | POA: Diagnosis not present

## 2022-01-14 DIAGNOSIS — E890 Postprocedural hypothyroidism: Secondary | ICD-10-CM

## 2022-01-14 DIAGNOSIS — M79642 Pain in left hand: Secondary | ICD-10-CM

## 2022-01-14 DIAGNOSIS — E1142 Type 2 diabetes mellitus with diabetic polyneuropathy: Secondary | ICD-10-CM | POA: Diagnosis not present

## 2022-01-14 DIAGNOSIS — M47816 Spondylosis without myelopathy or radiculopathy, lumbar region: Secondary | ICD-10-CM | POA: Diagnosis not present

## 2022-01-14 DIAGNOSIS — M19041 Primary osteoarthritis, right hand: Secondary | ICD-10-CM

## 2022-01-14 DIAGNOSIS — M79641 Pain in right hand: Secondary | ICD-10-CM | POA: Diagnosis not present

## 2022-01-14 DIAGNOSIS — M19072 Primary osteoarthritis, left ankle and foot: Secondary | ICD-10-CM

## 2022-01-14 DIAGNOSIS — M79644 Pain in right finger(s): Secondary | ICD-10-CM

## 2022-01-14 DIAGNOSIS — M19071 Primary osteoarthritis, right ankle and foot: Secondary | ICD-10-CM | POA: Diagnosis not present

## 2022-01-14 DIAGNOSIS — Z853 Personal history of malignant neoplasm of breast: Secondary | ICD-10-CM

## 2022-01-14 DIAGNOSIS — E6609 Other obesity due to excess calories: Secondary | ICD-10-CM

## 2022-01-14 NOTE — Patient Instructions (Addendum)
Hand Exercises Hand exercises can be helpful for almost anyone. These exercises can strengthen the hands, improve flexibility and movement, and increase blood flow to the hands. These results can make work and daily tasks easier. Hand exercises can be especially helpful for people who have joint pain from arthritis or have nerve damage from overuse (carpal tunnel syndrome). These exercises can also help people who have injured a hand. Exercises Most of these hand exercises are gentle stretching and motion exercises. It is usually safe to do them often throughout the day. Warming up your hands before exercise may help to reduce stiffness. You can do this with gentle massage or by placing your hands in warm water for 10-15 minutes. It is normal to feel some stretching, pulling, tightness, or mild discomfort as you begin new exercises. This will gradually improve. Stop an exercise right away if you feel sudden, severe pain or your pain gets worse. Ask your health care provider which exercises are best for you. Knuckle bend or "claw" fist  Stand or sit with your arm, hand, and all five fingers pointed straight up. Make sure to keep your wrist straight during the exercise. Gently bend your fingers down toward your palm until the tips of your fingers are touching the top of your palm. Keep your big knuckle straight and just bend the small knuckles in your fingers. Hold this position for __________ seconds. Straighten (extend) your fingers back to the starting position. Repeat this exercise 5-10 times with each hand. Full finger fist  Stand or sit with your arm, hand, and all five fingers pointed straight up. Make sure to keep your wrist straight during the exercise. Gently bend your fingers into your palm until the tips of your fingers are touching the middle of your palm. Hold this position for __________ seconds. Extend your fingers back to the starting position, stretching every joint fully. Repeat  this exercise 5-10 times with each hand. Straight fist Stand or sit with your arm, hand, and all five fingers pointed straight up. Make sure to keep your wrist straight during the exercise. Gently bend your fingers at the big knuckle, where your fingers meet your hand, and the middle knuckle. Keep the knuckle at the tips of your fingers straight and try to touch the bottom of your palm. Hold this position for __________ seconds. Extend your fingers back to the starting position, stretching every joint fully. Repeat this exercise 5-10 times with each hand. Tabletop  Stand or sit with your arm, hand, and all five fingers pointed straight up. Make sure to keep your wrist straight during the exercise. Gently bend your fingers at the big knuckle, where your fingers meet your hand, as far down as you can while keeping the small knuckles in your fingers straight. Think of forming a tabletop with your fingers. Hold this position for __________ seconds. Extend your fingers back to the starting position, stretching every joint fully. Repeat this exercise 5-10 times with each hand. Finger spread  Place your hand flat on a table with your palm facing down. Make sure your wrist stays straight as you do this exercise. Spread your fingers and thumb apart from each other as far as you can until you feel a gentle stretch. Hold this position for __________ seconds. Bring your fingers and thumb tight together again. Hold this position for __________ seconds. Repeat this exercise 5-10 times with each hand. Making circles  Stand or sit with your arm, hand, and all five fingers pointed   straight up. Make sure to keep your wrist straight during the exercise. Make a circle by touching the tip of your thumb to the tip of your index finger. Hold for __________ seconds. Then open your hand wide. Repeat this motion with your thumb and each finger on your hand. Repeat this exercise 5-10 times with each hand. Thumb  motion  Sit with your forearm resting on a table and your wrist straight. Your thumb should be facing up toward the ceiling. Keep your fingers relaxed as you move your thumb. Lift your thumb up as high as you can toward the ceiling. Hold for __________ seconds. Bend your thumb across your palm as far as you can, reaching the tip of your thumb for the small finger (pinkie) side of your palm. Hold for __________ seconds. Repeat this exercise 5-10 times with each hand. Grip strengthening  Hold a stress ball or other soft ball in the middle of your hand. Slowly increase the pressure, squeezing the ball as much as you can without causing pain. Think of bringing the tips of your fingers into the middle of your palm. All of your finger joints should bend when doing this exercise. Hold your squeeze for __________ seconds, then relax. Repeat this exercise 5-10 times with each hand. Contact a health care provider if: Your hand pain or discomfort gets much worse when you do an exercise. Your hand pain or discomfort does not improve within 2 hours after you exercise. If you have any of these problems, stop doing these exercises right away. Do not do them again unless your health care provider says that you can. Get help right away if: You develop sudden, severe hand pain or swelling. If this happens, stop doing these exercises right away. Do not do them again unless your health care provider says that you can. This information is not intended to replace advice given to you by your health care provider. Make sure you discuss any questions you have with your health care provider. Document Revised: 05/16/2020 Document Reviewed: 05/16/2020 Elsevier Patient Education  [place].  Back Exercises The following exercises strengthen the muscles that help to support the trunk (torso) and back. They also help to keep the lower back flexible. Doing these exercises can help to prevent or lessen existing low  back pain. If you have back pain or discomfort, try doing these exercises 2-3 times each day or as told by your health care provider. As your pain improves, do them once each day, but increase the number of times that you repeat the steps for each exercise (do more repetitions). To prevent the recurrence of back pain, continue to do these exercises once each day or as told by your health care provider. Do exercises exactly as told by your health care provider and adjust them as directed. It is normal to feel mild stretching, pulling, tightness, or discomfort as you do these exercises, but you should stop right away if you feel sudden pain or your pain gets worse. Exercises Single knee to chest Repeat these steps 3-5 times for each leg: Lie on your back on a firm bed or the floor with your legs extended. Bring one knee to your chest. Your other leg should stay extended and in contact with the floor. Hold your knee in place by grabbing your knee or thigh with both hands and hold. Pull on your knee until you feel a gentle stretch in your lower back or buttocks. Hold the stretch for 10-30  seconds. Slowly release and straighten your leg.  Pelvic tilt Repeat these steps 5-10 times: Lie on your back on a firm bed or the floor with your legs extended. Bend your knees so they are pointing toward the ceiling and your feet are flat on the floor. Tighten your lower abdominal muscles to press your lower back against the floor. This motion will tilt your pelvis so your tailbone points up toward the ceiling instead of pointing to your feet or the floor. With gentle tension and even breathing, hold this position for 5-10 seconds.  Cat-cow Repeat these steps until your lower back becomes more flexible: Get into a hands-and-knees position on a firm bed or the floor. Keep your hands under your shoulders, and keep your knees under your hips. You may place padding under your knees for comfort. Let your head hang  down toward your chest. Contract your abdominal muscles and point your tailbone toward the floor so your lower back becomes rounded like the back of a cat. Hold this position for 5 seconds. Slowly lift your head, let your abdominal muscles relax, and point your tailbone up toward the ceiling so your back forms a sagging arch like the back of a cow. Hold this position for 5 seconds.  Press-ups Repeat these steps 5-10 times: Lie on your abdomen (face-down) on a firm bed or the floor. Place your palms near your head, about shoulder-width apart. Keeping your back as relaxed as possible and keeping your hips on the floor, slowly straighten your arms to raise the top half of your body and lift your shoulders. Do not use your back muscles to raise your upper torso. You may adjust the placement of your hands to make yourself more comfortable. Hold this position for 5 seconds while you keep your back relaxed. Slowly return to lying flat on the floor.  Bridges Repeat these steps 10 times: Lie on your back on a firm bed or the floor. Bend your knees so they are pointing toward the ceiling and your feet are flat on the floor. Your arms should be flat at your sides, next to your body. Tighten your buttocks muscles and lift your buttocks off the floor until your waist is at almost the same height as your knees. You should feel the muscles working in your buttocks and the back of your thighs. If you do not feel these muscles, slide your feet 1-2 inches (2.5-5 cm) farther away from your buttocks. Hold this position for 3-5 seconds. Slowly lower your hips to the starting position, and allow your buttocks muscles to relax completely. If this exercise is too easy, try doing it with your arms crossed over your chest. Abdominal crunches Repeat these steps 5-10 times: Lie on your back on a firm bed or the floor with your legs extended. Bend your knees so they are pointing toward the ceiling and your feet are flat  on the floor. Cross your arms over your chest. Tip your chin slightly toward your chest without bending your neck. Tighten your abdominal muscles and slowly raise your torso high enough to lift your shoulder blades a tiny bit off the floor. Avoid raising your torso higher than that because it can put too much stress on your lower back and does not help to strengthen your abdominal muscles. Slowly return to your starting position.  Back lifts Repeat these steps 5-10 times: Lie on your abdomen (face-down) with your arms at your sides, and rest your forehead on the floor.  Tighten the muscles in your legs and your buttocks. Slowly lift your chest off the floor while you keep your hips pressed to the floor. Keep the back of your head in line with the curve in your back. Your eyes should be looking at the floor. Hold this position for 3-5 seconds. Slowly return to your starting position.  Contact a health care provider if: Your back pain or discomfort gets much worse when you do an exercise. Your worsening back pain or discomfort does not lessen within 2 hours after you exercise. If you have any of these problems, stop doing these exercises right away. Do not do them again unless your health care provider says that you can. Get help right away if: You develop sudden, severe back pain. If this happens, stop doing the exercises right away. Do not do them again unless your health care provider says that you can. This information is not intended to replace advice given to you by your health care provider. Make sure you discuss any questions you have with your health care provider. Document Revised: 07/23/2020 Document Reviewed: 04/10/2020 Elsevier Patient Education  [place].

## 2022-01-14 NOTE — Addendum Note (Signed)
Addended by: Earnestine Mealing on: 01/14/2022 10:28 AM   Modules accepted: Orders

## 2022-01-26 ENCOUNTER — Telehealth: Payer: Self-pay

## 2022-01-26 NOTE — Telephone Encounter (Signed)
Patient called and stated that she is unable to go see Dr. Iran Planas her endocrinologist anymore due to her not having a ride to his office anymore and wanted to know can Dr. Tobie Poet start checking her TSH level when she comes for her chronic visits. Please advise

## 2022-01-27 DIAGNOSIS — L84 Corns and callosities: Secondary | ICD-10-CM | POA: Diagnosis not present

## 2022-01-27 DIAGNOSIS — M89371 Hypertrophy of bone, right ankle and foot: Secondary | ICD-10-CM | POA: Diagnosis not present

## 2022-01-27 DIAGNOSIS — M79674 Pain in right toe(s): Secondary | ICD-10-CM | POA: Insufficient documentation

## 2022-02-04 ENCOUNTER — Other Ambulatory Visit: Payer: Self-pay | Admitting: Family Medicine

## 2022-02-12 DIAGNOSIS — M89371 Hypertrophy of bone, right ankle and foot: Secondary | ICD-10-CM | POA: Diagnosis not present

## 2022-02-16 ENCOUNTER — Other Ambulatory Visit: Payer: Self-pay | Admitting: Family Medicine

## 2022-02-16 DIAGNOSIS — K219 Gastro-esophageal reflux disease without esophagitis: Secondary | ICD-10-CM

## 2022-02-24 ENCOUNTER — Other Ambulatory Visit: Payer: Self-pay

## 2022-02-24 ENCOUNTER — Ambulatory Visit (INDEPENDENT_AMBULATORY_CARE_PROVIDER_SITE_OTHER): Payer: Medicare HMO

## 2022-02-24 DIAGNOSIS — Z0000 Encounter for general adult medical examination without abnormal findings: Secondary | ICD-10-CM

## 2022-02-24 NOTE — Patient Instructions (Signed)
Amy Mccann , Thank you for taking time to come for your Medicare Wellness Visit. I appreciate your ongoing commitment to your health goals. Please review the following plan we discussed and let me know if I can assist you in the future.   Screening recommendations/referrals: Mammogram: last screening was 09/23/21 Bone Density: last screening was 10/16/20 Results: osteopenia Recommended yearly ophthalmology/optometry visit for glaucoma screening and checkup Recommended yearly dental visit for hygiene and checkup  Vaccinations: Influenza vaccine: up-to-date Pneumococcal vaccine: Complete Tdap vaccine: Due 2028 Shingles vaccine: Shingrix due - you can get this at the pharmacy    Advanced directives: Please bring a copy for your medical record   Preventive Care 65 Years and Older, Female   Preventive care refers to lifestyle choices and visits with your health care provider that can promote health and wellness.  What does preventive care include? A yearly physical exam. This is also called an annual well check. Dental exams once or twice a year. Routine eye exams. Ask your health care provider how often you should have your eyes checked. Personal lifestyle choices, including: Daily care of your teeth and gums. Regular physical activity. Eating a healthy diet. Avoiding tobacco and drug use. Limiting alcohol use. Practicing safe sex. Taking low-dose aspirin every day. Taking vitamin and mineral supplements as recommended by your health care provider.  What happens during an annual well check? The services and screenings done by your health care provider during your annual well check will depend on your age, overall health, lifestyle risk factors, and family history of disease.  Counseling Your health care provider may ask you questions about your: Alcohol use. Tobacco use. Drug use. Emotional well-being. Home and relationship well-being. Sexual activity. Eating habits. History  of falls. Memory and ability to understand (cognition). Work and work Statistician. Reproductive health.  Screening You may have the following tests or measurements: Height, weight, and BMI. Blood pressure. Lipid and cholesterol levels. These may be checked every 5 years, or more frequently if you are over 62 years old. Skin check. Lung cancer screening. You may have this screening every year starting at age 82 if you have a 30-pack-year history of smoking and currently smoke or have quit within the past 15 years. Fecal occult blood test (FOBT) of the stool. You may have this test every year starting at age 82. Flexible sigmoidoscopy or colonoscopy. You may have a sigmoidoscopy every 5 years or a colonoscopy every 10 years starting at age 82. Hepatitis C blood test. Hepatitis B blood test. Sexually transmitted disease (STD) testing. Diabetes screening. This is done by checking your blood sugar (glucose) after you have not eaten for a while (fasting). You may have this done every 1-3 years. Bone density scan. This is done to screen for osteoporosis. You may have this done starting at age 82. Mammogram. This may be done every 1-2 years. Talk to your health care provider about how often you should have regular mammograms. Talk with your health care provider about your test results, treatment options, and if necessary, the need for more tests.  Vaccines Your health care provider may recommend certain vaccines, such as: Influenza vaccine. This is recommended every year. Tetanus, diphtheria, and acellular pertussis (Tdap, Td) vaccine. You may need a Td booster every 10 years. Zoster vaccine. You may need this after age 82. Pneumococcal 13-valent conjugate (PCV13) vaccine. One dose is recommended after age 82. Pneumococcal polysaccharide (PPSV23) vaccine. One dose is recommended after age 69. Talk to your health  care provider about which screenings and vaccines you need and how often you need  them.  This information is not intended to replace advice given to you by your health care provider. Make sure you discuss any questions you have with your health care provider. Document Released: 02/22/2015 Document Revised: 10/16/2015 Document Reviewed: 11/27/2014 Elsevier Interactive Patient Education  2017 [place] Prevention in the Home  Falls can cause injuries. They can happen to people of all ages. There are many things you can do to make your home safe and to help prevent falls.  What can I do on the outside of my home? Regularly fix the edges of walkways and driveways and fix any cracks. Remove anything that might make you trip as you walk through a door, such as a raised step or threshold. Trim any bushes or trees on the path to your home. Use bright outdoor lighting. Clear any walking paths of anything that might make someone trip, such as rocks or tools. Regularly check to see if handrails are loose or broken. Make sure that both sides of any steps have handrails. Any raised decks and porches should have guardrails on the edges. Have any leaves, snow, or ice cleared regularly. Use sand or salt on walking paths during winter. Clean up any spills in your garage right away. This includes oil or grease spills.  What can I do in the bathroom? Use night lights. Install grab bars by the toilet and in the tub and shower. Do not use towel bars as grab bars. Use non-skid mats or decals in the tub or shower. If you need to sit down in the shower, use a plastic, non-slip stool. Keep the floor dry. Clean up any water that spills on the floor as soon as it happens. Remove soap buildup in the tub or shower regularly. Attach bath mats securely with double-sided non-slip rug tape. Do not have throw rugs and other things on the floor that can make you trip.  What can I do in the bedroom? Use night lights. Make sure that you have a light by your bed that is easy to  reach. Do not use any sheets or blankets that are too big for your bed. They should not hang down onto the floor. Have a firm chair that has side arms. You can use this for support while you get dressed. Do not have throw rugs and other things on the floor that can make you trip.  What can I do in the kitchen? Clean up any spills right away. Avoid walking on wet floors. Keep items that you use a lot in easy-to-reach places. If you need to reach something above you, use a strong step stool that has a grab bar. Keep electrical cords out of the way. Do not use floor polish or wax that makes floors slippery. If you must use wax, use non-skid floor wax. Do not have throw rugs and other things on the floor that can make you trip.  What can I do with my stairs? Do not leave any items on the stairs. Make sure that there are handrails on both sides of the stairs and use them. Fix handrails that are broken or loose. Make sure that handrails are as long as the stairways. Check any carpeting to make sure that it is firmly attached to the stairs. Fix any carpet that is loose or worn. Avoid having throw rugs at the top or bottom of the stairs. If  you do have throw rugs, attach them to the floor with carpet tape. Make sure that you have a light switch at the top of the stairs and the bottom of the stairs. If you do not have them, ask someone to add them for you.  What else can I do to help prevent falls? Wear shoes that: Do not have high heels. Have rubber bottoms. Are comfortable and fit you well. Are closed at the toe. Do not wear sandals. If you use a stepladder: Make sure that it is fully opened. Do not climb a closed stepladder. Make sure that both sides of the stepladder are locked into place. Ask someone to hold it for you, if possible. Clearly mark and make sure that you can see: Any grab bars or handrails. First and last steps. Where the edge of each step is. Use tools that help you move  around (mobility aids) if they are needed. These include: Canes. Walkers. Scooters. Crutches. Turn on the lights when you go into a dark area. Replace any light bulbs as soon as they burn out. Set up your furniture so you have a clear path. Avoid moving your furniture around. If any of your floors are uneven, fix them. If there are any pets around you, be aware of where they are. Review your medicines with your doctor. Some medicines can make you feel dizzy. This can increase your chance of falling. Ask your doctor what other things that you can do to help prevent falls.  This information is not intended to replace advice given to you by your health care provider. Make sure you discuss any questions you have with your health care provider. Document Released: 11/22/2008 Document Revised: 07/04/2015 Document Reviewed: 03/02/2014 Elsevier Interactive Patient Education  2017 [employer].

## 2022-02-24 NOTE — Progress Notes (Signed)
Subjective:   Amy Mccann is a 82 y.o. female who presents for Medicare Annual (Subsequent) preventive examination.  I connected with  Amy Mccann on 02/24/22 by a audio enabled telemedicine application and verified that I am speaking with the correct person using two identifiers.  Patient Location: Home  Provider Location: Office/Clinic  I discussed the limitations of evaluation and management by telemedicine. The patient expressed understanding and agreed to proceed.  Cardiac Risk Factors include: advanced age (>11mn, >>54women);diabetes mellitus     Objective:    Today's Vitals   02/24/22 00160 PainSc: 2    There is no height or weight on file to calculate BMI.     02/19/2021    9:39 AM 07/26/2019   10:21 AM  Advanced Directives  Does Patient Have a Medical Advance Directive? Yes Yes  Type of Advance Directive Living will Living will  Does patient want to make changes to medical advance directive? No - Patient declined No - Patient declined    Current Medications (verified) Outpatient Encounter Medications as of 02/24/2022  Medication Sig   Accu-Chek Softclix Lancets lancets USE AS DIRECTED   dicyclomine (BENTYL) 20 MG tablet Take 1 tablet (20 mg total) by mouth 4 (four) times daily -  before meals and at bedtime.   esomeprazole (NEXIUM) 40 MG capsule TAKE 1 CAPSULE(40 MG) BY MOUTH TWICE DAILY BEFORE A MEAL   glucose blood (ACCU-CHEK AVIVA PLUS) test strip USE TO CHECK FASTING BLOOD SUGAR ONCE DAILY AS DIRECTED`   levothyroxine (SYNTHROID) 112 MCG tablet TAKE 1 TABLET(112 MCG) BY MOUTH DAILY BEFORE BREAKFAST   losartan (COZAAR) 25 MG tablet TAKE 1 TABLET BY MOUTH DAILY   Multiple Vitamin (MULTIVITAMIN PO) Take by mouth every other day.   ondansetron (ZOFRAN-ODT) 8 MG disintegrating tablet DISSOLVE 1/2 TABLET(4 MG) ON THE TONGUE EVERY 8 HOURS AS NEEDED FOR NAUSEA OR VOMITING   polyethylene glycol powder (GLYCOLAX/MIRALAX) 17 GM/SCOOP powder Take by mouth as  needed.   psyllium (METAMUCIL) 58.6 % packet Take by mouth.   rosuvastatin (CRESTOR) 5 MG tablet Take 1 tablet (5 mg total) by mouth daily.   ascorbic acid (VITAMIN C) 500 MG tablet Take by mouth. (Patient not taking: Reported on 01/14/2022)   No facility-administered encounter medications on file as of 02/24/2022.    Allergies (verified) Ergocalciferol, Codeine, and Penicillins   History: Past Medical History:  Diagnosis Date   Age-related osteoporosis without current pathological fracture 07/03/2019   [place]   [place] Cancer    [place]   Thyroid Cancer   Cervicalgia 07/03/2019   Diabetes mellitus without complication (HCC)    Generalized anxiety disorder    GERD (gastroesophageal reflux disease)    Hypertension    Low back pain with sciatica 07/03/2019   Obesity due to excess calories 07/03/2019   S/P repair of paraesophageal hernia 10/20/2013   S/P TKR (total knee replacement) 07/25/2012   Past Surgical History:  Procedure Laterality Date   [place]   [place]cedure: [place];  Surgeon: JMissy Sabins MD;  Location: [facility];  Service: Endoscopy;  Laterality: N/A;   APPENDECTOMY  03/17/2016   ESOPHAGEAL MANOMETRY N/A 08/07/2013   Procedure: ESOPHAGEAL MANOMETRY (EM);  Surgeon: JMissy Sabins MD;  Location: [facility];  Service: Endoscopy;  Laterality: N/A;   JOINT REPLACEMENT Right    right knee   MASTECTOMY Left 1990   THYROIDECTOMY Bilateral 08/2015   Family  History  Problem Relation Age of Onset   Leukemia Brother    Social History   Socioeconomic History   Marital status: Divorced    Spouse name: Not on file   Number of children: 0   Years of education: Not on file   Highest education level: Not on file  Occupational History   Occupation: Retired from [employer]  Tobacco Use   Smoking status: Never    Passive exposure: Never   Smokeless tobacco: Never  Vaping Use   Vaping Use: Never used  Substance and Sexual  Activity   Alcohol use: Never   Drug use: Never   Sexual activity: Not on file  Other Topics Concern   Not on file  Social History Narrative   One sister lives near by, the other sister lives in [place] Strain: [place]  (02/24/2022)   Overall Financial Resource Strain (CARDIA)    Difficulty of Paying Living Expenses: Not hard at all  Food Insecurity: No Food Insecurity (02/24/2022)   Hunger Vital Sign    Worried About Running Out of Food in the Last Year: Never true    [place] in the Last Year: Never true  Transportation Needs: No Transportation Needs (02/24/2022)   PRAPARE - Hydrologist (Medical): No    Lack of Transportation (Non-Medical): No  Physical Activity: Insufficiently Active (02/24/2022)   Exercise Vital Sign    Days of Exercise per Week: 7 days    Minutes of Exercise per Session: 20 min  Stress: No Stress Concern Present (02/24/2022)   [place]    Feeling of Stress : Not at all  Social Connections: Moderately Integrated (02/24/2022)   Social Connection and Isolation Panel [NHANES]    Frequency of Communication with Friends and Family: More than three times a week    Frequency of Social Gatherings with Friends and Family: More than three times a week    Attends Religious Services: More than 4 times per year    Active Member of [employer] or Organizations: No    Attends Music therapist: More than 4 times per year    Marital Status: Divorced    Tobacco Counseling Counseling given: Patient does not use tobacco products   Clinical Intake:  Pre-visit preparation completed: Yes Pain : 0-10 Pain Score: 2  Pain Type: Acute pain Pain Location: Toe (Comment which one) Effect of Pain on Daily Activities: none   BMI - recorded: 32.18 Nutritional Status: BMI > 30  Obese Nutritional Risks: None Diabetes:  Yes (last A1C 6.4) How often do you need to have someone help you when you read instructions, pamphlets, or other written materials from your doctor or pharmacy?: 3 - Sometimes Interpreter Needed?: No    Activities of Daily Living    02/24/2022    9:28 AM  In your present state of health, do you have any difficulty performing the following activities:  Hearing? 0  Vision? 0  Difficulty concentrating or making decisions? 1  Walking or climbing stairs? 1  Dressing or bathing? 0  Doing errands, shopping? 0  Preparing Food and eating ? N  Using the Toilet? N  In the past six months, have you accidently leaked urine? Y  Do you have problems with loss of bowel control? N  Managing your Medications? N  Managing your Finances? N  Housekeeping or managing  your Housekeeping? N    Patient Care Team: Rochel Brome, MD as PCP - General (Family Medicine) Eustace Moore, MD as Consulting Physician (Neurosurgery) Misenheimer, Christia Reading, MD as Consulting Physician (Unknown Physician Specialty) Derwood Kaplan, MD as Consulting Physician (Oncology) Valente David, OD (Optometry)     Assessment:   This is a routine wellness examination for Dilyn.  Hearing/Vision screen No results found.  Dietary issues and exercise activities discussed: Current Exercise Habits: Home exercise routine, Type of exercise: stretching, Time (Minutes): 10, Frequency (Times/Week): 7, Weekly Exercise (Minutes/Week): 70, Intensity: Mild, Exercise limited by: None identified   Goals Addressed             This Visit's Progress    Obtain Annual Eye (retinal)  Exam    On track      Depression Screen    02/24/2022    9:23 AM 08/29/2021    8:48 AM 02/19/2021    9:37 AM 03/06/2020   11:05 AM 11/03/2019    9:26 AM 07/26/2019   10:25 AM 07/04/2019    9:07 AM  PHQ 2/9 Scores  PHQ - 2 Score 0 0 0 0 0 0 0  PHQ- 9 Score    1 0      Fall Risk    02/24/2022    9:27 AM 08/29/2021    8:48 AM 02/19/2021    9:40 AM  01/20/2021   11:45 AM 09/02/2020    8:55 AM  Fall Risk   Falls in the past year? 0 0 0 0 0  Number falls in past yr: 0 0 0 0 0  Injury with Fall? 0 0 0 0 0  Risk for fall due to : Impaired balance/gait No Fall Risks Impaired balance/gait Impaired balance/gait   Follow up Falls evaluation completed;Education provided;Falls prevention discussed Falls evaluation completed Falls evaluation completed;Falls prevention discussed Falls evaluation completed Falls evaluation completed    FALL RISK PREVENTION PERTAINING TO THE HOME:  Any stairs in or around the home? No  If so, are there any without handrails? No  Home free of loose throw rugs in walkways, pet beds, electrical cords, etc? Yes  Adequate lighting in your home to reduce risk of falls? Yes   ASSISTIVE DEVICES UTILIZED TO PREVENT FALLS:  Use of a cane, walker or w/c? Yes  Grab bars in the bathroom? Yes  Shower chair or bench in shower? No  Elevated toilet seat or a handicapped toilet? No   Cognitive Function:        02/24/2022    9:30 AM 02/19/2021    9:41 AM 07/26/2019   10:28 AM  6CIT Screen  What Year? 0 points 0 points 0 points  What month? 0 points 0 points 0 points  What time? 0 points 0 points 0 points  Count back from 20 0 points 0 points 2 points  Months in reverse 4 points 4 points 2 points  Repeat phrase 2 points 2 points 2 points  Total Score 6 points 6 points 6 points    Immunizations Immunization History  Administered Date(s) Administered   Covid-19, Mrna,Vaccine(Spikevax)64yr and older 10/31/2021   Fluad Quad(high Dose 65+) 11/03/2019, 10/28/2020   Influenza-Unspecified 10/11/2021   PFIZER(Purple Top)SARS-COV-2 Vaccination 04/07/2019, 05/05/2019, 12/09/2019, 07/07/2020   Pfizer Covid-19 Vaccine Bivalent Booster 122yr& up 02/19/2021   Pneumococcal Conjugate-13 03/17/2016   Pneumococcal Polysaccharide-23 08/28/2013   Tdap 11/04/2016   Zoster, Live 05/18/2014    TDAP status: Up to date  Flu  Vaccine status: Up  to date  Pneumococcal vaccine status: Up to date  Covid-19 vaccine status: Completed vaccines  Qualifies for Shingles Vaccine? Yes   Zostavax completed Yes   Shingrix Completed?: No.    Education has been provided regarding the importance of this vaccine. Patient has been advised to call insurance company to determine out of pocket expense if they have not yet received this vaccine. Advised may also receive vaccine at local pharmacy or Health Dept. Verbalized acceptance and understanding.  Screening Tests Health Maintenance  Topic Date Due   Zoster Vaccines- Shingrix (1 of 2) Never done   COVID-19 Vaccine (7 - 2023-24 season) 12/26/2021   Medicare Annual Wellness (AWV)  02/19/2022   HEMOGLOBIN A1C  03/01/2022   Diabetic kidney evaluation - Urine ACR  05/16/2022   OPHTHALMOLOGY EXAM  08/06/2022   Diabetic kidney evaluation - eGFR measurement  08/30/2022   FOOT EXAM  08/30/2022   MAMMOGRAM  09/24/2022   DEXA SCAN  10/17/2022   DTaP/Tdap/Td (2 - Td or Tdap) 11/05/2026   Pneumonia Vaccine 71+ Years old  Completed   INFLUENZA VACCINE  Completed   HPV VACCINES  Aged Out    Health Maintenance  Health Maintenance Due  Topic Date Due   Zoster Vaccines- Shingrix (1 of 2) Never done   COVID-19 Vaccine (7 - 2023-24 season) 12/26/2021   Medicare Annual Wellness (AWV)  02/19/2022    Colorectal cancer screening: No longer required.   Mammogram status: Completed 09/23/21. Repeat every year  Bone Density status: Completed 10/16/20. Results reflect: Bone density results: OSTEOPENIA. Repeat every 2 years.  Lung Cancer Screening: (Low Dose CT Chest recommended if Age 24-80 years, 30 pack-year currently smoking OR have quit w/in 15years.) does not qualify.   Lung Cancer Screening Referral: N/A  Additional Screening:  Vision Screening: Recommended annual ophthalmology exams for early detection of glaucoma and other disorders of the eye. Is the patient up to date with  their annual eye exam?  Yes  Who is the provider or what is the name of the office in which the patient attends annual eye exams? Dr Hulan Saas in [place]: Recommended annual dental exams for proper oral hygiene  Community Resource Referral / Chronic Care Management: CRR required this visit?  No   CCM required this visit?  No      Plan:    I have personally reviewed and noted the following in the patient's chart:   Medical and social history Use of alcohol, tobacco or illicit drugs  Current medications and supplements including opioid prescriptions. Patient is not currently taking opioid prescriptions. Functional ability and status Nutritional status Physical activity Advanced directives List of other physicians Hospitalizations, surgeries, and ER visits in previous 12 months Vitals Screenings to include cognitive, depression, and falls Referrals and appointments  In addition, I have reviewed and discussed with patient certain preventive protocols, quality metrics, and best practice recommendations. A written personalized care plan for preventive services as well as general preventive health recommendations were provided to patient.     Erie Noe, LPN   [id number]

## 2022-02-27 DIAGNOSIS — M89371 Hypertrophy of bone, right ankle and foot: Secondary | ICD-10-CM | POA: Diagnosis not present

## 2022-03-02 ENCOUNTER — Other Ambulatory Visit: Payer: Self-pay | Admitting: Family Medicine

## 2022-03-05 ENCOUNTER — Ambulatory Visit (INDEPENDENT_AMBULATORY_CARE_PROVIDER_SITE_OTHER): Payer: Medicare HMO | Admitting: Nurse Practitioner

## 2022-03-05 ENCOUNTER — Encounter: Payer: Self-pay | Admitting: Family Medicine

## 2022-03-05 VITALS — BP 122/60 | HR 89 | Temp 97.2°F | Resp 18 | Ht 59.0 in | Wt 153.6 lb

## 2022-03-05 DIAGNOSIS — M8588 Other specified disorders of bone density and structure, other site: Secondary | ICD-10-CM | POA: Diagnosis not present

## 2022-03-05 DIAGNOSIS — E890 Postprocedural hypothyroidism: Secondary | ICD-10-CM | POA: Diagnosis not present

## 2022-03-05 DIAGNOSIS — E1159 Type 2 diabetes mellitus with other circulatory complications: Secondary | ICD-10-CM

## 2022-03-05 DIAGNOSIS — E1142 Type 2 diabetes mellitus with diabetic polyneuropathy: Secondary | ICD-10-CM

## 2022-03-05 DIAGNOSIS — K219 Gastro-esophageal reflux disease without esophagitis: Secondary | ICD-10-CM | POA: Diagnosis not present

## 2022-03-05 DIAGNOSIS — E6609 Other obesity due to excess calories: Secondary | ICD-10-CM

## 2022-03-05 DIAGNOSIS — Z6832 Body mass index (BMI) 32.0-32.9, adult: Secondary | ICD-10-CM

## 2022-03-05 DIAGNOSIS — I152 Hypertension secondary to endocrine disorders: Secondary | ICD-10-CM | POA: Diagnosis not present

## 2022-03-05 DIAGNOSIS — E782 Mixed hyperlipidemia: Secondary | ICD-10-CM

## 2022-03-05 MED ORDER — DICYCLOMINE HCL 20 MG PO TABS: ORAL_TABLET | ORAL | 4 refills | Status: DC

## 2022-03-05 NOTE — Assessment & Plan Note (Signed)
The current medical regimen is effective;  continue present plan and medications. nexium 40 mg twice daily

## 2022-03-05 NOTE — Progress Notes (Signed)
Subjective:  Patient ID: Amy Mccann, female    DOB: 1940-07-19  Age: 82 y.o. MRN: [id number]  Chief Complaints: Chronic follow up  History of Present illness:  Patient is here for a chronic follow up. Patient says she lives with herself, patient is independent and drive around. Patient use cane and walk some for exercise. Last Friday she went to foot surgery to taking care of her third and 4th toe corn and she is following up with them for stiches out today 3 pm and she is using ankle brace now.  Patient said she is cooking, watching her diet. She has a girlfriend to help her taking to the doctor as needed, she has two sisters and two brothers also. She did not fall so far and trying to stick with the healthy diet and some exercises daily.   HPI: Diabetes:  Complications: polyneuropathy Glucose checking: Once daily. Glucose logs: 100-140 Hypoglycemia: no  Most recent A1C: 6.4 Current medications:  Last Eye Exam: 08/05/2021. Saw Dr. Hulan Saas in [place] for  mild macular degeneration.  Foot checks: Daily  Hyperlipidemia: Current medications: Patient is currently taking Rosuvastatin 5 mg take 1 tablet daily.  Hypertension associated with diabetes: Current medications: Patient is currently taking losartan 25 mg take 1 tablet daily.  Hypothyroidism: Patient is currently taking Levothyroxine 112 mcg take 1 tablet daily. Current Outpatient Medications on File Prior to Visit  Medication Sig Dispense Refill   Accu-Chek Softclix Lancets lancets USE AS DIRECTED 100 each 5   ascorbic acid (VITAMIN C) 500 MG tablet Take by mouth.     dicyclomine (BENTYL) 20 MG tablet TAKE 1 TABLET(20 MG) BY MOUTH FOUR TIMES DAILY BEFORE MEALS AND AT BEDTIME 120 tablet 4   esomeprazole (NEXIUM) 40 MG capsule TAKE 1 CAPSULE(40 MG) BY MOUTH TWICE DAILY BEFORE A MEAL 180 capsule 0   glucose blood (ACCU-CHEK AVIVA PLUS) test strip USE TO CHECK FASTING BLOOD SUGAR ONCE DAILY AS DIRECTED` 100 strip 3    levothyroxine (SYNTHROID) 112 MCG tablet TAKE 1 TABLET(112 MCG) BY MOUTH DAILY BEFORE BREAKFAST 90 tablet 1   losartan (COZAAR) 25 MG tablet TAKE 1 TABLET BY MOUTH DAILY 90 tablet 0   Multiple Vitamin (MULTIVITAMIN PO) Take by mouth every other day.     ondansetron (ZOFRAN-ODT) 8 MG disintegrating tablet DISSOLVE 1/2 TABLET(4 MG) ON THE TONGUE EVERY 8 HOURS AS NEEDED FOR NAUSEA OR VOMITING 30 tablet 1   polyethylene glycol powder (GLYCOLAX/MIRALAX) 17 GM/SCOOP powder Take by mouth as needed.     psyllium (METAMUCIL) 58.6 % packet Take by mouth.     rosuvastatin (CRESTOR) 5 MG tablet Take 1 tablet (5 mg total) by mouth daily. 90 tablet 0   No current facility-administered medications on file prior to visit.   Past Medical History:  Diagnosis Date   Age-related osteoporosis without current pathological fracture 07/03/2019   Cancer [facility]) 1990   Left Breast Cancer    Cancer ([place]) 2017   Thyroid Cancer   Cervicalgia 07/03/2019   Diabetes mellitus without complication (HCC)    Generalized anxiety disorder    GERD (gastroesophageal reflux disease)    Hypertension    Low back pain with sciatica 07/03/2019   Obesity due to excess calories 07/03/2019   S/P repair of paraesophageal hernia 10/20/2013   S/P TKR (total knee replacement) 07/25/2012   Past Surgical History:  Procedure Laterality Date   24 HOUR [place] STUDY N/A 08/07/2013   Procedure: 24 HOUR [place] STUDY;  Surgeon: Elyse Jarvis  Amedeo Plenty, MD;  Location: [facility];  Service: Endoscopy;  Laterality: N/A;   APPENDECTOMY  03/17/2016   ESOPHAGEAL MANOMETRY N/A 08/07/2013   Procedure: ESOPHAGEAL MANOMETRY (EM);  Surgeon: Missy Sabins, MD;  Location: [facility];  Service: Endoscopy;  Laterality: N/A;   JOINT REPLACEMENT Right    right knee   MASTECTOMY Left 1990   THYROIDECTOMY Bilateral 08/2015    Family History  Problem Relation Age of Onset   Leukemia Brother    Social History   Socioeconomic History   Marital status: Divorced    Spouse  name: Not on file   Number of children: 0   Years of education: Not on file   Highest education level: Not on file  Occupational History   Occupation: Retired from [employer]  Tobacco Use   Smoking status: Never    Passive exposure: Never   Smokeless tobacco: Never  Vaping Use   Vaping Use: Never used  Substance and Sexual Activity   Alcohol use: Never   Drug use: Never   Sexual activity: Not on file  Other Topics Concern   Not on file  Social History Narrative   One sister lives near by, the other sister lives in [place] Strain: [place]  (02/24/2022)   Overall Financial Resource Strain (CARDIA)    Difficulty of Paying Living Expenses: Not hard at all  Food Insecurity: No Food Insecurity (02/24/2022)   Hunger Vital Sign    Worried About Running Out of Food in the Last Year: Never true    [place] in the Last Year: Never true  Transportation Needs: No Transportation Needs (02/24/2022)   PRAPARE - Hydrologist (Medical): No    Lack of Transportation (Non-Medical): No  Physical Activity: Insufficiently Active (02/24/2022)   Exercise Vital Sign    Days of Exercise per Week: 7 days    Minutes of Exercise per Session: 20 min  Stress: No Stress Concern Present (02/24/2022)   [place]    Feeling of Stress : Not at all  Social Connections: Moderately Integrated (02/24/2022)   Social Connection and Isolation Panel [NHANES]    Frequency of Communication with Friends and Family: More than three times a week    Frequency of Social Gatherings with Friends and Family: More than three times a week    Attends Religious Services: More than 4 times per year    Active Member of [employer] or Organizations: No    Attends Music therapist: More than 4 times per year    Marital Status: Divorced    Review of Systems  Constitutional:   Negative for chills and fatigue.  HENT:  Negative for congestion, ear pain, sinus pain and sore throat.   Respiratory:  Negative for cough and shortness of breath.   Cardiovascular:  Negative for chest pain and leg swelling.  Gastrointestinal:  Positive for abdominal pain and diarrhea. Negative for nausea and vomiting.  Genitourinary:  Negative for dysuria and frequency.  Musculoskeletal:  Negative for arthralgias, back pain and myalgias.  Neurological:  Negative for dizziness and headaches.      02/19/2021    9:40 AM 08/29/2021    8:48 AM 11/13/2021   10:41 AM 02/24/2022    9:27 AM 03/05/2022    9:19 AM  Fall Risk  Falls in the past year? 0 0  0 0  Was there an injury with Fall? 0 0  0 0  Fall Risk Category Calculator 0 0  0 0  Fall Risk Category (Retired) Low Low     (RETIRED) Patient Fall Risk Level Low fall risk Low fall risk Low fall risk    Patient at Risk for Falls Due to Impaired balance/gait No Fall Risks  Impaired balance/gait No Fall Risks  Fall risk Follow up Falls evaluation completed;Falls prevention discussed Falls evaluation completed  Falls evaluation completed;Education provided;Falls prevention discussed       03/05/2022   10:19 AM 03/05/2022    9:20 AM 02/24/2022    9:23 AM 08/29/2021    8:48 AM 02/19/2021    9:37 AM  Depression screen PHQ 2/9  Decreased Interest 0 1 0 0 0  Down, Depressed, Hopeless 0 0 0 0 0  PHQ - 2 Score 0 1 0 0 0  Altered sleeping 0      Tired, decreased energy 0      Change in appetite 0      Feeling bad or failure about yourself  0      Trouble concentrating 0      Moving slowly or fidgety/restless 0      Suicidal thoughts 0      PHQ-9 Score 0           Objective:  BP 122/60   Pulse 89   Temp (!) 97.2 F (36.2 C)   Resp 18   Ht '4\' 11"'$  (1.499 m)   Wt 153 lb 9.6 oz (69.7 kg)   SpO2 97%   BMI 31.02 kg/m      03/05/2022    8:50 AM 01/14/2022    9:00 AM 11/13/2021   10:38 AM  BP/Weight  Systolic BP 580 998 338  Diastolic BP 60  74 70  Wt. (Lbs) 153.6 159.4 158.3  BMI 31.02 kg/m2 32.19 kg/m2 32.52 kg/m2    Physical Exam Vitals reviewed.  Constitutional:      Appearance: Normal appearance. She is normal weight.  Neck:     Vascular: No carotid bruit.  Cardiovascular:     Rate and Rhythm: Normal rate and regular rhythm.     Heart sounds: Normal heart sounds.  Pulmonary:     Effort: Pulmonary effort is normal.     Breath sounds: Normal breath sounds.  Abdominal:     General: Abdomen is flat. Bowel sounds are normal.     Palpations: Abdomen is soft.     Tenderness: There is no abdominal tenderness.  Musculoskeletal:     Cervical back: Normal range of motion.  Neurological:     Mental Status: She is alert and oriented to person, place, and time.  Psychiatric:        Mood and Affect: Mood normal.        Behavior: Behavior normal.     Diabetic Foot Exam - Simple   No data filed      Lab Results  Component Value Date   WBC 8.6 08/29/2021   HGB 13.9 08/29/2021   HCT 39.9 08/29/2021   PLT 285 08/29/2021   GLUCOSE 119 (H) 08/29/2021   CHOL 168 08/29/2021   TRIG 157 (H) 08/29/2021   HDL 62 08/29/2021   LDLCALC 79 08/29/2021   ALT 21 08/29/2021   AST 20 08/29/2021   NA 136 08/29/2021   K 4.4 08/29/2021   CL 97 08/29/2021   CREATININE 0.92 08/29/2021   BUN 12 08/29/2021   CO2 22  08/29/2021   TSH 1.770 05/15/2021   INR 0.99 01/21/2010   HGBA1C 6.4 (H) 08/29/2021   MICROALBUR 30 04/05/2019     Assessment & Plan:  Mixed hyperlipidemia Assessment & Plan: Well controlled Crestor 5 mg 1 tab daily Continue to work on eating a healthy diet and exercise.  Labs drawn today.   Orders: -     Lipid panel  Hypertension complicating diabetes ([place]) Assessment & Plan: Well controlled.  No changes to medicines. currently taking losartan 25 mg take 1 tablet daily. Continue to work on eating a healthy diet and exercise.  Labs drawn today.   Orders: -     Hemoglobin A1c -     CBC with  Differential/Platelet -     Comprehensive metabolic panel  GERD without esophagitis Assessment & Plan: The current medical regimen is effective;  continue present plan and medications. nexium 40 mg twice daily  Orders: -     Dicyclomine HCl; TAKE 1 TABLET(20 MG) BY MOUTH FOUR TIMES DAILY BEFORE MEALS AND AT BEDTIME  Dispense: 120 tablet; Refill: 4  Postoperative hypothyroidism Assessment & Plan: Previously well controlled Continue Synthroid at current dose  Recheck TSH and adjust Synthroid as indicated   Orders: -     TSH  Osteopenia of lumbar spine Assessment & Plan: Continue calcium with vitamin D.    Class 1 obesity due to excess calories with serious comorbidity and body mass index (BMI) of 32.0 to 32.9 in adult Assessment & Plan: continue to work on eating healthy diet and exercise.     Type 2 diabetes mellitus with polyneuropathy (HCC) Assessment & Plan: Prediabetic 6.4 Recommend check sugars fasting daily runs between  Recommend check feet daily. Recommend annual eye exams. Medicines: None, patient says she wants to check sugar  Discussed for future diabetic medicine  Continue to work on eating a healthy diet and exercise.  Labs drawn today.      Follow-up:  in 3 months I, Solana Coggin have reviewed all documentation for this visit. The documentation on 03/05/22   for the exam, diagnosis, procedures, and orders are all accurate and complete.     An After Visit Summary was printed and given to the patient.  Neil Crouch, DNP, [place] [phone]

## 2022-03-05 NOTE — Assessment & Plan Note (Signed)
Continue calcium with vitamin D.

## 2022-03-05 NOTE — Assessment & Plan Note (Addendum)
Well controlled Crestor 5 mg 1 tab daily Continue to work on eating a healthy diet and exercise.  Labs drawn today.

## 2022-03-05 NOTE — Assessment & Plan Note (Addendum)
Prediabetic 6.4 Recommend check sugars fasting daily runs between  Recommend check feet daily. Recommend annual eye exams. Medicines: None, patient says she wants to check sugar  Discussed for future diabetic medicine  Continue to work on eating a healthy diet and exercise.  Labs drawn today.

## 2022-03-05 NOTE — Patient Instructions (Signed)
Come back in 3 months  Diabetes Mellitus and Nutrition, Adult When you have diabetes, or diabetes mellitus, it is very important to have healthy eating habits because your blood sugar (glucose) levels are greatly affected by what you eat and drink. Eating healthy foods in the right amounts, at about the same times every day, can help you: Manage your blood glucose. Lower your risk of heart disease. Improve your blood pressure. Reach or maintain a healthy weight. What can affect my meal plan? Every person with diabetes is different, and each person has different needs for a meal plan. Your health care provider may recommend that you work with a dietitian to make a meal plan that is best for you. Your meal plan may vary depending on factors such as: The calories you need. The medicines you take. Your weight. Your blood glucose, blood pressure, and cholesterol levels. Your activity level. Other health conditions you have, such as heart or kidney disease. How do carbohydrates affect me? Carbohydrates, also called carbs, affect your blood glucose level more than any other type of food. Eating carbs raises the amount of glucose in your blood. It is important to know how many carbs you can safely have in each meal. This is different for every person. Your dietitian can help you calculate how many carbs you should have at each meal and for each snack. How does alcohol affect me? Alcohol can cause a decrease in blood glucose (hypoglycemia), especially if you use insulin or take certain diabetes medicines by mouth. Hypoglycemia can be a life-threatening condition. Symptoms of hypoglycemia, such as sleepiness, dizziness, and confusion, are similar to symptoms of having too much alcohol. Do not drink alcohol if: Your health care provider tells you not to drink. You are pregnant, may be pregnant, or are planning to become pregnant. If you drink alcohol: Limit how much you have to: 0-1 drink a day for  women. 0-2 drinks a day for men. Know how much alcohol is in your drink. In the [place] one drink equals one 12 oz bottle of beer (355 mL), one 5 oz glass of wine (148 mL), or one 1 oz glass of hard liquor (44 mL). Keep yourself hydrated with water, diet soda, or unsweetened iced tea. Keep in mind that regular soda, juice, and other mixers may contain a lot of sugar and must be counted as carbs. What are tips for following this plan?  Reading food labels Start by checking the serving size on the Nutrition Facts label of packaged foods and drinks. The number of calories and the amount of carbs, fats, and other nutrients listed on the label are based on one serving of the item. Many items contain more than one serving per package. Check the total grams (g) of carbs in one serving. Check the number of grams of saturated fats and trans fats in one serving. Choose foods that have a low amount or none of these fats. Check the number of milligrams (mg) of salt (sodium) in one serving. Most people should limit total sodium intake to less than 2,300 mg per day. Always check the nutrition information of foods labeled as "low-fat" or "nonfat." These foods may be higher in added sugar or refined carbs and should be avoided. Talk to your dietitian to identify your daily goals for nutrients listed on the label. Shopping Avoid buying canned, pre-made, or processed foods. These foods tend to be high in fat, sodium, and added sugar. Shop around the outside edge of the  grocery store. This is where you will most often find fresh fruits and vegetables, bulk grains, fresh meats, and fresh dairy products. Cooking Use low-heat cooking methods, such as baking, instead of high-heat cooking methods, such as deep frying. Cook using healthy oils, such as olive, canola, or sunflower oil. Avoid cooking with butter, cream, or high-fat meats. Meal planning Eat meals and snacks regularly, preferably at the same times every day.  Avoid going long periods of time without eating. Eat foods that are high in fiber, such as fresh fruits, vegetables, beans, and whole grains. Eat 4-6 oz (112-168 g) of lean protein each day, such as lean meat, chicken, fish, eggs, or tofu. One ounce (oz) (28 g) of lean protein is equal to: 1 oz (28 g) of meat, chicken, or fish. 1 egg.  cup (62 g) of tofu. Eat some foods each day that contain healthy fats, such as avocado, nuts, seeds, and fish. What foods should I eat? Fruits Berries. Apples. Oranges. Peaches. Apricots. Plums. Grapes. Mangoes. Papayas. Pomegranates. Kiwi. Cherries. Vegetables Leafy greens, including lettuce, spinach, kale, chard, collard greens, mustard greens, and cabbage. Beets. Cauliflower. Broccoli. Carrots. Green beans. Tomatoes. Peppers. Onions. Cucumbers. Brussels sprouts. Grains Whole grains, such as whole-wheat or whole-grain bread, crackers, tortillas, cereal, and pasta. Unsweetened oatmeal. Quinoa. Brown or wild rice. Meats and other proteins Seafood. Poultry without skin. Lean cuts of poultry and beef. Tofu. Nuts. Seeds. Dairy Low-fat or fat-free dairy products such as milk, yogurt, and cheese. The items listed above may not be a complete list of foods and beverages you can eat and drink. Contact a dietitian for more information. What foods should I avoid? Fruits Fruits canned with syrup. Vegetables Canned vegetables. Frozen vegetables with butter or cream sauce. Grains Refined white flour and flour products such as bread, pasta, snack foods, and cereals. Avoid all processed foods. Meats and other proteins Fatty cuts of meat. Poultry with skin. Breaded or fried meats. Processed meat. Avoid saturated fats. Dairy Full-fat yogurt, cheese, or milk. Beverages Sweetened drinks, such as soda or iced tea. The items listed above may not be a complete list of foods and beverages you should avoid. Contact a dietitian for more information. Questions to ask a health  care provider Do I need to meet with a certified diabetes care and education specialist? Do I need to meet with a dietitian? What number can I call if I have questions? When are the best times to check my blood glucose? Where to find more information: American Diabetes Association: diabetes.org Academy of Nutrition and Dietetics: eatright.[employer] of Diabetes and Digestive and Kidney Diseases: [employer] Association of Diabetes Care & Education Specialists: diabeteseducator.org Summary It is important to have healthy eating habits because your blood sugar (glucose) levels are greatly affected by what you eat and drink. It is important to use alcohol carefully. A healthy meal plan will help you manage your blood glucose and lower your risk of heart disease. Your health care provider may recommend that you work with a dietitian to make a meal plan that is best for you. This information is not intended to replace advice given to you by your health care provider. Make sure you discuss any questions you have with your health care provider. Document Revised: 08/30/2019 Document Reviewed: 08/30/2019 Elsevier Patient Education  [place].

## 2022-03-05 NOTE — Assessment & Plan Note (Signed)
continue to work on eating healthy diet and exercise.

## 2022-03-05 NOTE — Assessment & Plan Note (Signed)
Previously well controlled Continue Synthroid at current dose  Recheck TSH and adjust Synthroid as indicated   

## 2022-03-05 NOTE — Assessment & Plan Note (Signed)
Well controlled.  No changes to medicines. currently taking losartan 25 mg take 1 tablet daily. Continue to work on eating a healthy diet and exercise.  Labs drawn today.

## 2022-03-06 LAB — COMPREHENSIVE METABOLIC PANEL
ALT: 16 IU/L (ref 0–32)
AST: 15 IU/L (ref 0–40)
Albumin/Globulin Ratio: 1.6 (ref 1.2–2.2)
Albumin: 4.4 g/dL (ref 3.7–4.7)
Alkaline Phosphatase: 132 IU/L — ABNORMAL HIGH (ref 44–121)
BUN/Creatinine Ratio: 14 (ref 12–28)
BUN: 13 mg/dL (ref 8–27)
Bilirubin Total: 0.3 mg/dL (ref 0.0–1.2)
CO2: 19 mmol/L — ABNORMAL LOW (ref 20–29)
Calcium: 9.9 mg/dL (ref 8.7–10.3)
Chloride: 97 mmol/L (ref 96–106)
Creatinine, Ser: 0.93 mg/dL (ref 0.57–1.00)
Globulin, Total: 2.7 g/dL (ref 1.5–4.5)
Glucose: 127 mg/dL — ABNORMAL HIGH (ref 70–99)
Potassium: 4.1 mmol/L (ref 3.5–5.2)
Sodium: 134 mmol/L (ref 134–144)
Total Protein: 7.1 g/dL (ref 6.0–8.5)
eGFR: 62 mL/min/{1.73_m2} (ref >59)

## 2022-03-06 LAB — CBC WITH DIFFERENTIAL/PLATELET
Basophils Absolute: 0 10*3/uL (ref 0.0–0.2)
Basos: 0 %
EOS (ABSOLUTE): 0.1 10*3/uL (ref 0.0–0.4)
Eos: 1 %
Hematocrit: 41.4 % (ref 34.0–46.6)
Hemoglobin: 14.1 g/dL (ref 11.1–15.9)
Immature Grans (Abs): 0.1 10*3/uL (ref 0.0–0.1)
Immature Granulocytes: 1 %
Lymphocytes Absolute: 1.5 10*3/uL (ref 0.7–3.1)
Lymphs: 15 %
MCH: 32.2 pg (ref 26.6–33.0)
MCHC: 34.1 g/dL (ref 31.5–35.7)
MCV: 95 fL (ref 79–97)
Monocytes Absolute: 0.6 10*3/uL (ref 0.1–0.9)
Monocytes: 6 %
Neutrophils Absolute: 7.4 10*3/uL — ABNORMAL HIGH (ref 1.4–7.0)
Neutrophils: 77 %
Platelets: 312 10*3/uL (ref 150–450)
RBC: 4.38 x10E6/uL (ref 3.77–5.28)
RDW: 12.1 % (ref 11.7–15.4)
WBC: 9.6 10*3/uL (ref 3.4–10.8)

## 2022-03-06 LAB — CARDIOVASCULAR RISK ASSESSMENT

## 2022-03-06 LAB — LIPID PANEL
Chol/HDL Ratio: 2.5 ratio (ref 0.0–4.4)
Cholesterol, Total: 167 mg/dL (ref 100–199)
HDL: 67 mg/dL (ref >39)
LDL Chol Calc (NIH): 77 mg/dL (ref 0–99)
Triglycerides: 131 mg/dL (ref 0–149)
VLDL Cholesterol Cal: 23 mg/dL (ref 5–40)

## 2022-03-06 LAB — HEMOGLOBIN A1C
Est. average glucose Bld gHb Est-mCnc: 140 mg/dL
Hgb A1c MFr Bld: 6.5 % — ABNORMAL HIGH (ref 4.8–5.6)

## 2022-03-06 LAB — TSH: TSH: 3.68 u[IU]/mL (ref 0.450–4.500)

## 2022-03-06 NOTE — Progress Notes (Deleted)
Office Visit Note  Patient: Amy Mccann             Date of Birth: 15-Apr-1940           MRN: [id number]             PCP: Rochel Brome, MD Referring: Rochel Brome, MD Visit Date: 03/18/2022 Occupation: '@GUAROCC'$ @  Subjective:  No chief complaint on file.   History of Present Illness: Amy Mccann is a 82 y.o. female ***     Activities of Daily Living:  Patient reports morning stiffness for *** {minute/hour:19697}.   Patient {ACTIONS;DENIES/REPORTS:21021675::"Denies"} nocturnal pain.  Difficulty dressing/grooming: {ACTIONS;DENIES/REPORTS:21021675::"Denies"} Difficulty climbing stairs: {ACTIONS;DENIES/REPORTS:21021675::"Denies"} Difficulty getting out of chair: {ACTIONS;DENIES/REPORTS:21021675::"Denies"} Difficulty using hands for taps, buttons, cutlery, and/or writing: {ACTIONS;DENIES/REPORTS:21021675::"Denies"}  No Rheumatology ROS completed.   PMFS History:  Patient Active Problem List   Diagnosis Date Noted   Corn of toe 01/27/2022   Hypertrophy of bone, right ankle and foot 01/27/2022   Pain of toe of right foot 01/27/2022   Pain in finger of both hands 05/21/2021   Pain of upper abdomen 05/21/2021   Chronic midline low back pain without sciatica 01/06/2021   Primary osteoarthritis of both hands 01/06/2021   Hallux valgus with bunions 07/04/2019   Low back pain with sciatica 07/03/2019   Cervicalgia 07/03/2019   Class 1 obesity due to excess calories with serious comorbidity and body mass index (BMI) of 32.0 to 32.9 in adult 07/03/2019   Generalized anxiety disorder 07/03/2019   Osteopenia 07/03/2019   Hypertension complicating diabetes ([place]) 04/05/2019   GERD without esophagitis 04/05/2019   Mixed hyperlipidemia 04/05/2019   Lumbar spondylosis 02/21/2019   Postoperative hypothyroidism 10/02/2015   Type 2 diabetes mellitus with polyneuropathy ([place]) 06/27/2013   History of left breast cancer 06/27/2013    Past Medical History:  Diagnosis Date    Age-related osteoporosis without current pathological fracture 07/03/2019   Cancer ([place]) 1990   Left Breast Cancer    Cancer ([place]) 2017   Thyroid Cancer   Cervicalgia 07/03/2019   Diabetes mellitus without complication (HCC)    Generalized anxiety disorder    GERD (gastroesophageal reflux disease)    Hypertension    Low back pain with sciatica 07/03/2019   Obesity due to excess calories 07/03/2019   S/P repair of paraesophageal hernia 10/20/2013   S/P TKR (total knee replacement) 07/25/2012    Family History  Problem Relation Age of Onset   Leukemia Brother    Past Surgical History:  Procedure Laterality Date   63 HOUR [place] STUDY N/A 08/07/2013   Procedure: 24 HOUR [place] STUDY;  Surgeon: Missy Sabins, MD;  Location: [facility];  Service: Endoscopy;  Laterality: N/A;   APPENDECTOMY  03/17/2016   ESOPHAGEAL MANOMETRY N/A 08/07/2013   Procedure: ESOPHAGEAL MANOMETRY (EM);  Surgeon: Missy Sabins, MD;  Location: [facility];  Service: Endoscopy;  Laterality: N/A;   JOINT REPLACEMENT Right    right knee   MASTECTOMY Left 1990   THYROIDECTOMY Bilateral 08/2015   Social History   Social History Narrative   One sister lives near by, the other sister lives in [place]    Immunization History  Administered Date(s) Administered   Covid-19, Mrna,Vaccine(Spikevax)36yr and older 10/31/2021   Fluad Quad(high Dose 65+) 11/03/2019, 10/28/2020   Influenza-Unspecified 10/11/2021   PFIZER(Purple Top)SARS-COV-2 Vaccination 04/07/2019, 05/05/2019, 12/09/2019, 07/07/2020   Pfizer Covid-19 Vaccine Bivalent Booster 145yr& up 02/19/2021   Pneumococcal Conjugate-13 03/17/2016   Pneumococcal Polysaccharide-23 08/28/2013   Tdap  11/04/2016   Zoster, Live 05/18/2014     Objective: Vital Signs: There were no vitals taken for this visit.   Physical Exam   Musculoskeletal Exam: ***  CDAI Exam: CDAI Score: -- Patient Global: --; Provider Global: -- Swollen: --; Tender: -- Joint Exam 03/18/2022   No  joint exam has been documented for this visit   There is currently no information documented on the homunculus. Go to the Rheumatology activity and complete the homunculus joint exam.  Investigation: No additional findings.  Imaging: No results found.  Recent Labs: Lab Results  Component Value Date   WBC 9.6 03/05/2022   HGB 14.1 03/05/2022   PLT 312 03/05/2022   NA 134 03/05/2022   K 4.1 03/05/2022   CL 97 03/05/2022   CO2 19 (L) 03/05/2022   GLUCOSE 127 (H) 03/05/2022   BUN 13 03/05/2022   CREATININE 0.93 03/05/2022   BILITOT 0.3 03/05/2022   ALKPHOS 132 (H) 03/05/2022   AST 15 03/05/2022   ALT 16 03/05/2022   PROT 7.1 03/05/2022   ALBUMIN 4.4 03/05/2022   CALCIUM 9.9 03/05/2022   GFRAA 70 03/05/2020     Speciality Comments: No specialty comments available.  Procedures:  No procedures performed Allergies: Ergocalciferol, Codeine, and Penicillins   Assessment / Plan:     Visit Diagnoses: No diagnosis found.  Orders: No orders of the defined types were placed in this encounter.  No orders of the defined types were placed in this encounter.   Face-to-face time spent with patient was *** minutes. Greater than 50% of time was spent in counseling and coordination of care.  Follow-Up Instructions: No follow-ups on file.   Bo Merino, MD  Note - This record has been created using Editor, commissioning.  Chart creation errors have been sought, but may not always  have been located. Such creation errors do not reflect on  the standard of medical care.

## 2022-03-09 ENCOUNTER — Telehealth: Payer: Self-pay

## 2022-03-09 NOTE — Telephone Encounter (Signed)
Patient made aware of labs, verbalized understanding.

## 2022-03-18 ENCOUNTER — Ambulatory Visit: Payer: Medicare HMO | Admitting: Rheumatology

## 2022-03-18 DIAGNOSIS — K219 Gastro-esophageal reflux disease without esophagitis: Secondary | ICD-10-CM

## 2022-03-18 DIAGNOSIS — M542 Cervicalgia: Secondary | ICD-10-CM

## 2022-03-18 DIAGNOSIS — E782 Mixed hyperlipidemia: Secondary | ICD-10-CM

## 2022-03-18 DIAGNOSIS — E6609 Other obesity due to excess calories: Secondary | ICD-10-CM

## 2022-03-18 DIAGNOSIS — E1142 Type 2 diabetes mellitus with diabetic polyneuropathy: Secondary | ICD-10-CM

## 2022-03-18 DIAGNOSIS — E1159 Type 2 diabetes mellitus with other circulatory complications: Secondary | ICD-10-CM

## 2022-03-18 DIAGNOSIS — Z853 Personal history of malignant neoplasm of breast: Secondary | ICD-10-CM

## 2022-03-18 DIAGNOSIS — M8588 Other specified disorders of bone density and structure, other site: Secondary | ICD-10-CM

## 2022-03-18 DIAGNOSIS — M47816 Spondylosis without myelopathy or radiculopathy, lumbar region: Secondary | ICD-10-CM

## 2022-03-18 DIAGNOSIS — F411 Generalized anxiety disorder: Secondary | ICD-10-CM

## 2022-03-18 DIAGNOSIS — M19042 Primary osteoarthritis, left hand: Secondary | ICD-10-CM

## 2022-03-18 DIAGNOSIS — M40294 Other kyphosis, thoracic region: Secondary | ICD-10-CM

## 2022-03-18 DIAGNOSIS — M19071 Primary osteoarthritis, right ankle and foot: Secondary | ICD-10-CM

## 2022-03-18 DIAGNOSIS — E890 Postprocedural hypothyroidism: Secondary | ICD-10-CM

## 2022-03-19 NOTE — Progress Notes (Signed)
Office Visit Note  Patient: Amy Mccann             Date of Birth: [birth year]           MRN: [id number]             PCP: Rochel Brome, MD Referring: Rochel Brome, MD Visit Date: 03/20/2022 Occupation: @GUAROCC$ @  Subjective:  Pain in multiple joints  History of Present Illness: Amy Mccann is a 82 y.o. female returns for follow-up visit for the evaluation of pain in multiple joints.  She continues to have neck and lower back discomfort.  She is followed by Dr. Ronnald Ramp for lower back pain.  She gives history of pain and stiffness in her bilateral hands, bilateral feet.  She notices intermittent swelling in her hands.    Activities of Daily Living:  Patient reports morning stiffness for 10 minutes.   Patient Denies nocturnal pain.  Difficulty dressing/grooming: Denies Difficulty climbing stairs: Reports Difficulty getting out of chair: Reports Difficulty using hands for taps, buttons, cutlery, and/or writing: Denies  Review of Systems  Constitutional:  Positive for fatigue.  HENT:  Positive for mouth dryness. Negative for mouth sores.   Respiratory:  Negative for wheezing and difficulty breathing.   Cardiovascular: Negative.  Negative for chest pain and palpitations.  Gastrointestinal:  Positive for diarrhea. Negative for blood in stool and constipation.  Endocrine: Negative.  Negative for increased urination.  Genitourinary:  Positive for involuntary urination.  Musculoskeletal:  Positive for joint pain, gait problem, joint pain and morning stiffness. Negative for joint swelling, myalgias, muscle weakness, muscle tenderness and myalgias.  Skin: Negative.  Negative for color change, rash, hair loss and sensitivity to sunlight.  Allergic/Immunologic: Negative.  Negative for susceptible to infections.  Neurological:  Negative for dizziness and headaches.  Hematological:  Negative for swollen glands.  Psychiatric/Behavioral:  Positive for sleep disturbance. Negative for  depressed mood. The patient is not nervous/anxious.     PMFS History:  Patient Active Problem List   Diagnosis Date Noted   Corn of toe 01/27/2022   Hypertrophy of bone, right ankle and foot 01/27/2022   Pain of toe of right foot 01/27/2022   Pain in finger of both hands 05/21/2021   Pain of upper abdomen 05/21/2021   Chronic midline low back pain without sciatica 01/06/2021   Primary osteoarthritis of both hands 01/06/2021   Hallux valgus with bunions 07/04/2019   Low back pain with sciatica 07/03/2019   Cervicalgia 07/03/2019   Class 1 obesity due to excess calories with serious comorbidity and body mass index (BMI) of 32.0 to 32.9 in adult 07/03/2019   Generalized anxiety disorder 07/03/2019   Osteopenia 07/03/2019   Hypertension complicating diabetes ([place]) 04/05/2019   GERD without esophagitis 04/05/2019   Mixed hyperlipidemia 04/05/2019   Lumbar spondylosis 02/21/2019   Postoperative hypothyroidism 10/02/2015   Type 2 diabetes mellitus with polyneuropathy ([place]) 06/27/2013   History of left breast cancer 06/27/2013    Past Medical History:  Diagnosis Date   Age-related osteoporosis without current pathological fracture 07/03/2019   Cancer ([place]) 1990   Left Breast Cancer    Cancer ([place]) 2017   Thyroid Cancer   Cervicalgia 07/03/2019   Diabetes mellitus without complication ([place])    Generalized anxiety disorder    GERD (gastroesophageal reflux disease)    Hypertension    Low back pain with sciatica 07/03/2019   Obesity due to excess calories 07/03/2019   S/P repair of paraesophageal hernia 10/20/2013  S/P TKR (total knee replacement) 07/25/2012    Family History  Problem Relation Age of Onset   Leukemia Brother    Past Surgical History:  Procedure Laterality Date   47 HOUR [place] STUDY N/A 08/07/2013   Procedure: 24 HOUR [place] STUDY;  Surgeon: Missy Sabins, MD;  [facility];  Service: Endoscopy;  Laterality: N/A;   APPENDECTOMY  03/17/2016   ESOPHAGEAL MANOMETRY  N/A 08/07/2013   Procedure: ESOPHAGEAL MANOMETRY (EM);  Surgeon: Missy Sabins, MD;  [facility];  Service: Endoscopy;  Laterality: N/A;   JOINT REPLACEMENT Right    right knee   MASTECTOMY Left 1990   THYROIDECTOMY Bilateral 08/2015   Social History   Social History Narrative   One sister lives near by, the other sister lives in [place]    Immunization History  Administered Date(s) Administered   Covid-19, Mrna,Vaccine(Spikevax)1yr and older 10/31/2021   Fluad Quad(high Dose 65+) 11/03/2019, 10/28/2020   Influenza-Unspecified 10/11/2021   PFIZER(Purple Top)SARS-COV-2 Vaccination 04/07/2019, 05/05/2019, 12/09/2019, 07/07/2020   Pfizer Covid-19 Vaccine Bivalent Booster 136yr& up 02/19/2021   Pneumococcal Conjugate-13 03/17/2016   Pneumococcal Polysaccharide-23 08/28/2013   Tdap 11/04/2016   Zoster, Live 05/18/2014     Objective: Vital Signs: BP 107/69 (BP Location: Right Arm, Patient Position: Sitting, Cuff Size: Normal)   Pulse 69   Resp 16   Ht 4' 11"$  (1.499 m)   Wt 154 lb 4.8 oz (70 kg)   BMI 31.16 kg/m    Physical Exam Vitals and nursing note reviewed.  Constitutional:      Appearance: She is well-developed.  HENT:     Head: Normocephalic and atraumatic.  Eyes:     Conjunctiva/sclera: Conjunctivae normal.  Cardiovascular:     Rate and Rhythm: Normal rate and regular rhythm.     Heart sounds: Normal heart sounds.  Pulmonary:     Effort: Pulmonary effort is normal.     Breath sounds: Normal breath sounds.  Abdominal:     General: Bowel sounds are normal.     Palpations: Abdomen is soft.  Musculoskeletal:     Cervical back: Normal range of motion.  Lymphadenopathy:     Cervical: No cervical adenopathy.  Skin:    General: Skin is warm and dry.     Capillary Refill: Capillary refill takes less than 2 seconds.  Neurological:     Mental Status: She is alert and oriented to person, place, and time.  Psychiatric:        Behavior: Behavior normal.       Musculoskeletal Exam: She had limited lateral rotation of the cervical spine.  She had discomfort range of motion of her lumbar spine.  She has been using a brace to support her back.  She ambulates with the help of a cane.  Shoulders and elbows were in good range of motion.  She had good range of motion of bilateral wrist joints without any synovitis.  She had bilateral CMC PIP and DIP thickening with no synovitis.  Hip joints and knee joints were in good range of motion.  Right knee joint was replaced without any warmth swelling or effusion.  There was no tenderness over ankles or MTPs.  CDAI Exam: CDAI Score: -- Patient Global: --; Provider Global: -- Swollen: --; Tender: -- Joint Exam 03/20/2022   No joint exam has been documented for this visit   There is currently no information documented on the homunculus. Go to the Rheumatology activity and complete the homunculus joint exam.  Investigation: No additional findings.  Imaging: No results found.  Recent Labs: Lab Results  Component Value Date   WBC 9.6 03/05/2022   HGB 14.1 03/05/2022   PLT 312 03/05/2022   NA 134 03/05/2022   K 4.1 03/05/2022   CL 97 03/05/2022   CO2 19 (L) 03/05/2022   GLUCOSE 127 (H) 03/05/2022   BUN 13 03/05/2022   CREATININE 0.93 03/05/2022   BILITOT 0.3 03/05/2022   ALKPHOS 132 (H) 03/05/2022   AST 15 03/05/2022   ALT 16 03/05/2022   PROT 7.1 03/05/2022   ALBUMIN 4.4 03/05/2022   CALCIUM 9.9 03/05/2022   GFRAA 70 03/05/2020     Speciality Comments: No specialty comments available.  Procedures:  No procedures performed Allergies: Ergocalciferol, Codeine, and Penicillins   Assessment / Plan:     Visit Diagnoses: Primary osteoarthritis of both hands -she continues to have pain and stiffness in her bilateral hands.  Clinical and radiographic findings were consistent with severe erosive osteoarthritis.  All autoimmune workup was negative.05/15/21: Anti-CCP 4, ESR 27, RF<10, uric acid  5.1, CRP 1, TSH 1.77 .  X-ray results and lab results were discussed with the patient at length.  A handout on hand exercises was given.  She was referred to PT and OT.  Patient plans to go for physical therapy.  Primary osteoarthritis of both feet -he complains of discomfort in the bilateral feet.  Clinical findings are consistent with osteoarthritis.  She had a callus under her right third toe.  Patient states she had callus removed and it is gradually healing.  She is followed by podiatrist.  Cervicalgia-she continues to have some stiffness in her cervical spine.  She had limited range of motion of the cervical spine.  A handout on neck exercises was given.  Lumbar spondylosis -she continues to have lower back pain.  She ambulates with the help of a cane and wears a back brace.  She has been under care of Dr. Ronnald Ramp.  Surgery was advised by Dr. Ronnald Ramp as per patient.  Osteopenia of lumbar spine - September 02, 2020 DEXA scan showed T-score of -1.5.  Patient will have repeat DEXA scan this year.  Calcium rich diet was emphasized.  Need for regular exercise was discussed.  Other medical problems are listed as follows:  Other kyphosis of thoracic region  History of left breast cancer - 1990, mastectomy, chemotherapy and radiation therapy.  Followed by oncologist.  Hypertension complicating diabetes (HCC)-blood pressures was normal at 107/67.  Type 2 diabetes mellitus with polyneuropathy (HCC)  Mixed hyperlipidemia  Class 1 obesity due to excess calories with serious comorbidity and body mass index (BMI) of 32.0 to 32.9 in adult  Postoperative hypothyroidism  GERD without esophagitis  Generalized anxiety disorder  Orders: No orders of the defined types were placed in this encounter.  No orders of the defined types were placed in this encounter.    Follow-Up Instructions: Return if symptoms worsen or fail to improve, for Osteoarthritis.  Patient was advised to return if she develops any  new symptoms.   Bo Merino, MD  Note - This record has been created using Editor, commissioning.  Chart creation errors have been sought, but may not always  have been located. Such creation errors do not reflect on  the standard of medical care.

## 2022-03-20 ENCOUNTER — Encounter: Payer: Self-pay | Admitting: Rheumatology

## 2022-03-20 ENCOUNTER — Ambulatory Visit: Payer: Medicare HMO | Attending: Rheumatology | Admitting: Rheumatology

## 2022-03-20 VITALS — BP 107/69 | HR 69 | Resp 16 | Ht 59.0 in | Wt 154.3 lb

## 2022-03-20 DIAGNOSIS — E782 Mixed hyperlipidemia: Secondary | ICD-10-CM

## 2022-03-20 DIAGNOSIS — M40294 Other kyphosis, thoracic region: Secondary | ICD-10-CM | POA: Diagnosis not present

## 2022-03-20 DIAGNOSIS — E890 Postprocedural hypothyroidism: Secondary | ICD-10-CM

## 2022-03-20 DIAGNOSIS — F411 Generalized anxiety disorder: Secondary | ICD-10-CM

## 2022-03-20 DIAGNOSIS — M47816 Spondylosis without myelopathy or radiculopathy, lumbar region: Secondary | ICD-10-CM

## 2022-03-20 DIAGNOSIS — M19072 Primary osteoarthritis, left ankle and foot: Secondary | ICD-10-CM

## 2022-03-20 DIAGNOSIS — M19042 Primary osteoarthritis, left hand: Secondary | ICD-10-CM

## 2022-03-20 DIAGNOSIS — M8588 Other specified disorders of bone density and structure, other site: Secondary | ICD-10-CM

## 2022-03-20 DIAGNOSIS — Z853 Personal history of malignant neoplasm of breast: Secondary | ICD-10-CM

## 2022-03-20 DIAGNOSIS — E1159 Type 2 diabetes mellitus with other circulatory complications: Secondary | ICD-10-CM

## 2022-03-20 DIAGNOSIS — Z6832 Body mass index (BMI) 32.0-32.9, adult: Secondary | ICD-10-CM

## 2022-03-20 DIAGNOSIS — M542 Cervicalgia: Secondary | ICD-10-CM | POA: Diagnosis not present

## 2022-03-20 DIAGNOSIS — E1142 Type 2 diabetes mellitus with diabetic polyneuropathy: Secondary | ICD-10-CM | POA: Diagnosis not present

## 2022-03-20 DIAGNOSIS — K219 Gastro-esophageal reflux disease without esophagitis: Secondary | ICD-10-CM

## 2022-03-20 DIAGNOSIS — M19041 Primary osteoarthritis, right hand: Secondary | ICD-10-CM | POA: Diagnosis not present

## 2022-03-20 DIAGNOSIS — I152 Hypertension secondary to endocrine disorders: Secondary | ICD-10-CM

## 2022-03-20 DIAGNOSIS — E6609 Other obesity due to excess calories: Secondary | ICD-10-CM

## 2022-03-20 DIAGNOSIS — M19071 Primary osteoarthritis, right ankle and foot: Secondary | ICD-10-CM | POA: Diagnosis not present

## 2022-03-20 NOTE — Patient Instructions (Signed)
Hand Exercises Hand exercises can be helpful for almost anyone. These exercises can strengthen the hands, improve flexibility and movement, and increase blood flow to the hands. These results can make work and daily tasks easier. Hand exercises can be especially helpful for people who have joint pain from arthritis or have nerve damage from overuse (carpal tunnel syndrome). These exercises can also help people who have injured a hand. Exercises Most of these hand exercises are gentle stretching and motion exercises. It is usually safe to do them often throughout the day. Warming up your hands before exercise may help to reduce stiffness. You can do this with gentle massage or by placing your hands in warm water for 10-15 minutes. It is normal to feel some stretching, pulling, tightness, or mild discomfort as you begin new exercises. This will gradually improve. Stop an exercise right away if you feel sudden, severe pain or your pain gets worse. Ask your health care provider which exercises are best for you. Knuckle bend or "claw" fist  Stand or sit with your arm, hand, and all five fingers pointed straight up. Make sure to keep your wrist straight during the exercise. Gently bend your fingers down toward your palm until the tips of your fingers are touching the top of your palm. Keep your big knuckle straight and just bend the small knuckles in your fingers. Hold this position for __________ seconds. Straighten (extend) your fingers back to the starting position. Repeat this exercise 5-10 times with each hand. Full finger fist  Stand or sit with your arm, hand, and all five fingers pointed straight up. Make sure to keep your wrist straight during the exercise. Gently bend your fingers into your palm until the tips of your fingers are touching the middle of your palm. Hold this position for __________ seconds. Extend your fingers back to the starting position, stretching every joint fully. Repeat  this exercise 5-10 times with each hand. Straight fist Stand or sit with your arm, hand, and all five fingers pointed straight up. Make sure to keep your wrist straight during the exercise. Gently bend your fingers at the big knuckle, where your fingers meet your hand, and the middle knuckle. Keep the knuckle at the tips of your fingers straight and try to touch the bottom of your palm. Hold this position for __________ seconds. Extend your fingers back to the starting position, stretching every joint fully. Repeat this exercise 5-10 times with each hand. Tabletop  Stand or sit with your arm, hand, and all five fingers pointed straight up. Make sure to keep your wrist straight during the exercise. Gently bend your fingers at the big knuckle, where your fingers meet your hand, as far down as you can while keeping the small knuckles in your fingers straight. Think of forming a tabletop with your fingers. Hold this position for __________ seconds. Extend your fingers back to the starting position, stretching every joint fully. Repeat this exercise 5-10 times with each hand. Finger spread  Place your hand flat on a table with your palm facing down. Make sure your wrist stays straight as you do this exercise. Spread your fingers and thumb apart from each other as far as you can until you feel a gentle stretch. Hold this position for __________ seconds. Bring your fingers and thumb tight together again. Hold this position for __________ seconds. Repeat this exercise 5-10 times with each hand. Making circles  Stand or sit with your arm, hand, and all five fingers pointed   straight up. Make sure to keep your wrist straight during the exercise. Make a circle by touching the tip of your thumb to the tip of your index finger. Hold for __________ seconds. Then open your hand wide. Repeat this motion with your thumb and each finger on your hand. Repeat this exercise 5-10 times with each hand. Thumb  motion  Sit with your forearm resting on a table and your wrist straight. Your thumb should be facing up toward the ceiling. Keep your fingers relaxed as you move your thumb. Lift your thumb up as high as you can toward the ceiling. Hold for __________ seconds. Bend your thumb across your palm as far as you can, reaching the tip of your thumb for the small finger (pinkie) side of your palm. Hold for __________ seconds. Repeat this exercise 5-10 times with each hand. Grip strengthening  Hold a stress ball or other soft ball in the middle of your hand. Slowly increase the pressure, squeezing the ball as much as you can without causing pain. Think of bringing the tips of your fingers into the middle of your palm. All of your finger joints should bend when doing this exercise. Hold your squeeze for __________ seconds, then relax. Repeat this exercise 5-10 times with each hand. Contact a health care provider if: Your hand pain or discomfort gets much worse when you do an exercise. Your hand pain or discomfort does not improve within 2 hours after you exercise. If you have any of these problems, stop doing these exercises right away. Do not do them again unless your health care provider says that you can. Get help right away if: You develop sudden, severe hand pain or swelling. If this happens, stop doing these exercises right away. Do not do them again unless your health care provider says that you can. This information is not intended to replace advice given to you by your health care provider. Make sure you discuss any questions you have with your health care provider. Document Revised: 05/16/2020 Document Reviewed: 05/16/2020 Elsevier Patient Education  2023 Else[employer] Inc[employer] Exercises for Chronic Knee Pain Chronic knee pain is pain that lasts longer than 3 months. For most people with chronic knee pain, exercise and weight loss is an important part of treatment. Your health care provider may want  you to focus on: Strengthening the muscles that support your knee. This can take pressure off your knee and lessen pain. Preventing knee stiffness. Maintaining or increasing how far you can move your knee. Losing weight (if this applies) to take pressure off your knee, decrease your risk for injury, and make it easier for you to exercise. Your health care provider will help you develop an exercise program that matches your needs and physical abilities. Below are simple, low-impact exercises you can do at home. Ask your health care provider or a physical therapist how often you should do your exercise program and how many times to repeat each exercise. General safety tips Follow these safety tips for exercising with chronic knee pain: Get your health care provider's approval before doing any exercises. Start slowly and stop any time an exercise causes pain. Do not exercise if your knee pain is flaring up. Warm up first. Stretching a cold muscle can cause an injury. Do 5-10 minutes of easy movement or light stretching before beginning your exercise routine. Do 5-10 minutes of low-impact activity (like walking or cycling) before starting strengthening exercises. Contact your health care provider any time you have pain   during or after exercising. Exercise may cause discomfort but should not be painful. It is normal to be a little stiff or sore after exercising.  Stretching and range-of-motion exercises Front thigh stretch  Stand up straight and support your body by holding on to a chair or resting one hand on a wall. With your legs straight and close together, bend one knee to lift your heel up toward your buttocks. Using one hand for support, grab your ankle with your free hand. Pull your foot up closer toward your buttocks to feel the stretch in front of your thigh. Hold the stretch for 30 seconds. Repeat __________ times. Complete this exercise __________ times a day. Back thigh stretch  Sit  on the floor with your back straight and your legs out straight in front of you. Place the palms of your hands on the floor and slide them toward your feet as you bend at the hip. Try to touch your nose to your knees and feel the stretch in the back of your thighs. Hold for 30 seconds. Repeat __________ times. Complete this exercise __________ times a day. Calf stretch  Stand facing a wall. Place the palms of your hands flat against the wall, arms extended, and lean slightly against the wall. Get into a lunge position with one leg bent at the knee and the other leg stretched out straight behind you. Keep both feet facing the wall and increase the bend in your knee while keeping the heel of the other leg flat on the ground. You should feel the stretch in your calf. Hold for 30 seconds. Repeat __________ times. Complete this exercise __________ times a day. Strengthening exercises Straight leg lift Lie on your back with one knee bent and the other leg out straight. Slowly lift the straight leg without bending the knee. Lift until your foot is about 12 inches (30 cm) off the floor. Hold for 3-5 seconds and slowly lower your leg. Repeat __________ times. Complete this exercise __________ times a day. Single leg dip Stand between two chairs and put both hands on the backs of the chairs for support. Extend one leg out straight with your body weight resting on the heel of the standing leg. Slowly bend your standing knee to dip your body to the level that is comfortable for you. Hold for 3-5 seconds. Repeat __________ times. Complete this exercise __________ times a day. Hamstring curls Stand straight, knees close together, facing the back of a chair. Hold on to the back of a chair with both hands. Keep one leg straight. Bend the other knee while bringing the heel up toward the buttock until the knee is bent at a 90-degree angle (right angle). Hold for 3-5 seconds. Repeat __________ times.  Complete this exercise __________ times a day. Wall squat Stand straight with your back, hips, and head against a wall. Step forward one foot at a time with your back still against the wall. Your feet should be 2 feet (61 cm) from the wall at shoulder width. Keeping your back, hips, and head against the wall, slide down the wall to as close of a sitting position as you can get. Hold for 5-10 seconds, then slowly slide back up. Repeat __________ times. Complete this exercise __________ times a day. Step-ups Step up with one foot onto a sturdy platform or stool that is about 6 inches (15 cm) high. Face sideways with one foot on the platform and one on the ground. Place all your weight on  on the platform foot and lift your body off the ground until your knee extends. Let your other leg hang free to the side. Hold for 3-5 seconds then slowly lower your weight down to the floor foot. Repeat __________ times. Complete this exercise __________ times a day. Contact a health care provider if: Your exercise causes pain. Your pain is worse after you exercise. Your pain prevents you from doing your exercises. This information is not intended to replace advice given to you by your health care provider. Make sure you discuss any questions you have with your health care provider. Document Revised: 06/01/2019 Document Reviewed: 01/23/2019 Elsevier Patient Education  2023 Elsevier Inc.  

## 2022-03-21 ENCOUNTER — Other Ambulatory Visit: Payer: Self-pay | Admitting: Family Medicine

## 2022-03-30 ENCOUNTER — Ambulatory Visit: Payer: Medicare HMO

## 2022-04-20 ENCOUNTER — Other Ambulatory Visit: Payer: Self-pay | Admitting: Family Medicine

## 2022-05-13 ENCOUNTER — Telehealth: Payer: Self-pay

## 2022-05-13 NOTE — Telephone Encounter (Signed)
Patient called stating that she believes that the Dicyclomine is making her sleepy.  She asked the pharmacist about it and the pharmacist told her that people over 74 shouldn't take that - so she stopped taking them March 28.  No complaints of abdominal pain since then.

## 2022-05-25 ENCOUNTER — Other Ambulatory Visit: Payer: Self-pay | Admitting: Family Medicine

## 2022-05-25 DIAGNOSIS — K219 Gastro-esophageal reflux disease without esophagitis: Secondary | ICD-10-CM

## 2022-05-28 ENCOUNTER — Other Ambulatory Visit: Payer: Self-pay | Admitting: Physician Assistant

## 2022-05-28 DIAGNOSIS — E1169 Type 2 diabetes mellitus with other specified complication: Secondary | ICD-10-CM

## 2022-05-29 ENCOUNTER — Other Ambulatory Visit: Payer: Self-pay

## 2022-05-29 DIAGNOSIS — E1169 Type 2 diabetes mellitus with other specified complication: Secondary | ICD-10-CM

## 2022-05-29 MED ORDER — ACCU-CHEK AVIVA PLUS VI STRP: ORAL_STRIP | 3 refills | Status: DC

## 2022-06-05 ENCOUNTER — Ambulatory Visit (INDEPENDENT_AMBULATORY_CARE_PROVIDER_SITE_OTHER): Payer: Medicare HMO | Admitting: Family Medicine

## 2022-06-05 VITALS — BP 116/56 | HR 96 | Temp 96.3°F | Resp 18 | Ht 59.0 in | Wt 155.6 lb

## 2022-06-05 DIAGNOSIS — R400 Somnolence: Secondary | ICD-10-CM

## 2022-06-05 DIAGNOSIS — E782 Mixed hyperlipidemia: Secondary | ICD-10-CM | POA: Diagnosis not present

## 2022-06-05 DIAGNOSIS — E890 Postprocedural hypothyroidism: Secondary | ICD-10-CM

## 2022-06-05 DIAGNOSIS — E1142 Type 2 diabetes mellitus with diabetic polyneuropathy: Secondary | ICD-10-CM

## 2022-06-05 DIAGNOSIS — I152 Hypertension secondary to endocrine disorders: Secondary | ICD-10-CM

## 2022-06-05 DIAGNOSIS — E1159 Type 2 diabetes mellitus with other circulatory complications: Secondary | ICD-10-CM | POA: Diagnosis not present

## 2022-06-05 NOTE — Progress Notes (Unsigned)
Subjective:  Patient ID: Amy Mccann, female    DOB: 1940-02-16  Age: 82 y.o. MRN: [id number]  Chief Complaint  Patient presents with   Hypothyroidism   Hyperlipidemia   Diabetes   Hypertension    HPI Diabetes:  Complications: polyneuropathy Glucose checking: Once daily. Glucose logs: 140-150. Hypoglycemia: no  Most recent A1C: 6.4 Current medications: none Last Eye Exam: 08/05/2021. Saw Dr. Lawernce Ion in [place] for  mild macular degeneration.  Foot checks: Daily  Hyperlipidemia: Current medications: Patient is currently taking Rosuvastatin 5 mg take 1 tablet daily.  Hypertension associated with diabetes: Current medications: Patient is currently taking losartan 25 mg take 1 tablet daily.  Hypothyroidism: Patient is currently taking Levothyroxine 112 mcg take 1 tablet daily. Current Outpatient Medications on File Prior to Visit       03/05/2022   10:19 AM 03/05/2022    9:20 AM 02/24/2022    9:23 AM 08/29/2021    8:48 AM 02/19/2021    9:37 AM  Depression screen PHQ 2/9  Decreased Interest 0 1 0 0 0  Down, Depressed, Hopeless 0 0 0 0 0  PHQ - 2 Score 0 1 0 0 0  Altered sleeping 0      Tired, decreased energy 0      Change in appetite 0      Feeling bad or failure about yourself  0      Trouble concentrating 0      Moving slowly or fidgety/restless 0      Suicidal thoughts 0      PHQ-9 Score 0             08/29/2021    8:48 AM 11/13/2021   10:41 AM 02/24/2022    9:27 AM 03/05/2022    9:19 AM 03/05/2022   10:19 AM  Fall Risk  Falls in the past year? 0  0 0 0  Was there an injury with Fall? 0  0 0 0  Fall Risk Category Calculator 0  0 0 0  Fall Risk Category (Retired) Low      (RETIRED) Patient Fall Risk Level Low fall risk Low fall risk     Patient at Risk for Falls Due to No Fall Risks  Impaired balance/gait No Fall Risks   Fall risk Follow up Falls evaluation completed  Falls evaluation completed;Education provided;Falls prevention discussed         Review of Systems  Constitutional:  Positive for fatigue. Negative for chills and fever.  HENT:  Positive for voice change. Negative for congestion, rhinorrhea and sore throat.   Respiratory:  Positive for shortness of breath. Negative for cough.   Cardiovascular:  Negative for chest pain.  Gastrointestinal:  Negative for abdominal pain, constipation, diarrhea, nausea and vomiting.  Genitourinary:  Negative for dysuria and urgency.  Musculoskeletal:  Positive for arthralgias (hands painful and swollen) and back pain. Negative for myalgias.  Neurological:  Positive for headaches. Negative for dizziness, weakness and light-headedness.  Psychiatric/Behavioral:  Negative for dysphoric mood. The patient is not nervous/anxious.     Current Outpatient Medications on File Prior to Visit  Medication Sig Dispense Refill   esomeprazole (NEXIUM) 40 MG capsule TAKE 1 CAPSULE(40 MG) BY MOUTH TWICE DAILY BEFORE A MEAL 180 capsule 3   levothyroxine (SYNTHROID) 112 MCG tablet TAKE 1 TABLET(112 MCG) BY MOUTH DAILY BEFORE BREAKFAST 90 tablet 1   losartan (COZAAR) 25 MG tablet TAKE 1 TABLET BY MOUTH DAILY 90 tablet 0   Multiple Vitamin (MULTIVITAMIN  PO) Take by mouth every other day.     Accu-Chek Softclix Lancets lancets USE AS DIRECTED 100 each 5   ascorbic acid (VITAMIN C) 500 MG tablet Take by mouth. (Patient not taking: Reported on 03/20/2022)     dicyclomine (BENTYL) 20 MG tablet TAKE 1 TABLET(20 MG) BY MOUTH FOUR TIMES DAILY BEFORE MEALS AND AT BEDTIME 120 tablet 4   glucose blood (ACCU-CHEK AVIVA PLUS) test strip USE TO CHECK FASTING BLOOD SUGAR ONCE DAILY AS DIRECTED 100 strip 3   ondansetron (ZOFRAN-ODT) 8 MG disintegrating tablet DISSOLVE 1/2 TABLET(4 MG) ON THE TONGUE EVERY 8 HOURS AS NEEDED FOR NAUSEA OR VOMITING 30 tablet 1   polyethylene glycol powder (GLYCOLAX/MIRALAX) 17 GM/SCOOP powder Take by mouth as needed.     psyllium (METAMUCIL) 58.6 % packet Take by mouth.     rosuvastatin (CRESTOR)  5 MG tablet TAKE 1 TABLET(5 MG) BY MOUTH DAILY 90 tablet 1   traMADol (ULTRAM) 50 MG tablet Take by mouth.     No current facility-administered medications on file prior to visit.   Past Medical History:  Diagnosis Date   Age-related osteoporosis without current pathological fracture 07/03/2019   Cancer [facility]) 1990   Left Breast Cancer    Cancer (HCC) 2017   Thyroid Cancer   Cervicalgia 07/03/2019   Diabetes mellitus without complication (HCC)    Generalized anxiety disorder    GERD (gastroesophageal reflux disease)    Hypertension    Low back pain with sciatica 07/03/2019   Obesity due to excess calories 07/03/2019   S/P repair of paraesophageal hernia 10/20/2013   S/P TKR (total knee replacement) 07/25/2012   Past Surgical History:  Procedure Laterality Date   24 HOUR PH STUDY N/A 08/07/2013   Procedure: 24 HOUR PH STUDY;  Surgeon: Barrie Folk, MD;  Location: [facility];  Service: Endoscopy;  Laterality: N/A;   APPENDECTOMY  03/17/2016   ESOPHAGEAL MANOMETRY N/A 08/07/2013   Procedure: ESOPHAGEAL MANOMETRY (EM);  Surgeon: Barrie Folk, MD;  Location: [facility];  Service: Endoscopy;  Laterality: N/A;   JOINT REPLACEMENT Right    right knee   MASTECTOMY Left 1990   THYROIDECTOMY Bilateral 08/2015    Family History  Problem Relation Age of Onset   Leukemia Brother    Social History   Socioeconomic History   Marital status: Divorced    Spouse name: Not on file   Number of children: 0   Years of education: Not on file   Highest education level: Not on file  Occupational History   Occupation: Retired from [employer]  Tobacco Use   Smoking status: Never    Passive exposure: Never   Smokeless tobacco: Never  Vaping Use   Vaping Use: Never used  Substance and Sexual Activity   Alcohol use: Never   Drug use: Never   Sexual activity: Not Currently  Other Topics Concern   Not on file  Social History Narrative   One sister lives near by, the other sister lives in [place]     Social Determinants of Health   Financial Resource Strain: Low Risk  (02/24/2022)   Overall Financial Resource Strain (CARDIA)    Difficulty of Paying Living Expenses: Not hard at all  Food Insecurity: No Food Insecurity (02/24/2022)   Hunger Vital Sign    Worried About Running Out of Food in the Last Year: Never true    Ran Out of Food in the Last Year: Never true  Transportation Needs: No Transportation Needs (02/24/2022)  PRAPARE - Administrator, Civil Service (Medical): No    Lack of Transportation (Non-Medical): No  Physical Activity: Insufficiently Active (02/24/2022)   Exercise Vital Sign    Days of Exercise per Week: 7 days    Minutes of Exercise per Session: 20 min  Stress: No Stress Concern Present (02/24/2022)   [employer] of Occupational Health - Occupational Stress Questionnaire    Feeling of Stress : Not at all  Social Connections: Moderately Integrated (02/24/2022)   Social Connection and Isolation Panel [NHANES]    Frequency of Communication with Friends and Family: More than three times a week    Frequency of Social Gatherings with Friends and Family: More than three times a week    Attends Religious Services: More than 4 times per year    Active Member of [employer] or Organizations: No    Attends Engineer, structural: More than 4 times per year    Marital Status: Divorced    Objective:  BP (!) 116/56   Pulse 96   Temp (!) 96.3 F (35.7 C)   Resp 18   Ht 4\' 11"  (1.499 m)   Wt 155 lb 9.6 oz (70.6 kg)   BMI 31.43 kg/m      06/05/2022    9:37 AM 03/20/2022   10:56 AM 03/05/2022    8:50 AM  BP/Weight  Systolic BP 116 107 122  Diastolic BP 56 69 60  Wt. (Lbs) 155.6 154.3 153.6  BMI 31.43 kg/m2 31.16 kg/m2 31.02 kg/m2    Physical Exam Vitals reviewed.  Constitutional:      Appearance: Normal appearance. She is normal weight.  Neck:     Vascular: No carotid bruit.  Cardiovascular:     Rate and Rhythm: Normal rate and regular  rhythm.     Heart sounds: Normal heart sounds.  Pulmonary:     Effort: Pulmonary effort is normal. No respiratory distress.     Breath sounds: Normal breath sounds.  Abdominal:     General: Abdomen is flat. Bowel sounds are normal.     Palpations: Abdomen is soft.     Tenderness: There is no abdominal tenderness.  Neurological:     Mental Status: She is alert and oriented to person, place, and time.  Psychiatric:        Mood and Affect: Mood normal.        Behavior: Behavior normal.     Diabetic Foot Exam - Simple   Simple Foot Form  06/05/2022 12:44 AM  Visual Inspection No deformities, no ulcerations, no other skin breakdown bilaterally: Yes See comments: Yes Sensation Testing Intact to touch and monofilament testing bilaterally: Yes Pulse Check Posterior Tibialis and Dorsalis pulse intact bilaterally: Yes Comments Bunion right foot. Corns and callouses.  Is followed by podiatry.        Lab Results  Component Value Date   WBC 7.1 06/05/2022   HGB 13.1 06/05/2022   HCT 39.5 06/05/2022   PLT 311 06/05/2022   GLUCOSE 113 (H) 06/05/2022   CHOL 164 06/05/2022   TRIG 156 (H) 06/05/2022   HDL 68 06/05/2022   LDLCALC 70 06/05/2022   ALT 18 06/05/2022   AST 17 06/05/2022   NA 137 06/05/2022   K 4.3 06/05/2022   CL 98 06/05/2022   CREATININE 0.95 06/05/2022   BUN 13 06/05/2022   CO2 20 06/05/2022   TSH 1.050 06/05/2022   INR 0.99 01/21/2010   HGBA1C 6.6 (H) 06/05/2022   MICROALBUR 30  04/05/2019      Assessment & Plan:    Mixed hyperlipidemia Assessment & Plan: Well controlled.  No changes to medicines. Continue Rosuvastatin 5 mg take 1 tablet daily. Continue to work on eating a healthy diet and exercise.  Labs drawn today.    Orders: -     Lipid panel  Hypertension complicating diabetes (HCC) Assessment & Plan: Hypertension and diabetes well controlled.  Recommend check sugars fasting daily. Recommend check feet daily. Recommend annual eye  exams. Medicines: losartan 25 mg daily.  Continue to work on eating a healthy diet and exercise.  Labs drawn today.      Orders: -     Comprehensive metabolic panel -     Hemoglobin A1c -     CBC with Differential/Platelet -     TSH -     Microalbumin / creatinine urine ratio -     Cardiovascular Risk Assessment  Postoperative hypothyroidism Assessment & Plan: Previously well controlled Continue Synthroid at current dose  Recheck TSH and adjust Synthroid as indicated     Daytime somnolence Assessment & Plan: Check labs.       No orders of the defined types were placed in this encounter.   Orders Placed This Encounter  Procedures   Comprehensive metabolic panel   Hemoglobin A1c   CBC with Differential/Platelet   Lipid panel   TSH   Microalbumin / creatinine urine ratio   Cardiovascular Risk Assessment     Follow-up: No follow-ups on file.   I,Carolyn M Morrison,acting as a Neurosurgeon for Blane Ohara, MD.,have documented all relevant documentation on the behalf of Blane Ohara, MD,as directed by  Blane Ohara, MD while in the presence of Blane Ohara, MD.   An After Visit Summary was printed and given to the patient.  Blane Ohara, MD [facility] [phone]

## 2022-06-06 LAB — CBC WITH DIFFERENTIAL/PLATELET
Basophils Absolute: 0.1 10*3/uL (ref 0.0–0.2)
Basos: 1 %
EOS (ABSOLUTE): 0.1 10*3/uL (ref 0.0–0.4)
Eos: 1 %
Hematocrit: 39.5 % (ref 34.0–46.6)
Hemoglobin: 13.1 g/dL (ref 11.1–15.9)
Immature Grans (Abs): 0 10*3/uL (ref 0.0–0.1)
Immature Granulocytes: 0 %
Lymphocytes Absolute: 1.6 10*3/uL (ref 0.7–3.1)
Lymphs: 22 %
MCH: 31.6 pg (ref 26.6–33.0)
MCHC: 33.2 g/dL (ref 31.5–35.7)
MCV: 95 fL (ref 79–97)
Monocytes Absolute: 0.6 10*3/uL (ref 0.1–0.9)
Monocytes: 8 %
Neutrophils Absolute: 4.8 10*3/uL (ref 1.4–7.0)
Neutrophils: 68 %
Platelets: 311 10*3/uL (ref 150–450)
RBC: 4.15 x10E6/uL (ref 3.77–5.28)
RDW: 12.1 % (ref 11.7–15.4)
WBC: 7.1 10*3/uL (ref 3.4–10.8)

## 2022-06-06 LAB — LIPID PANEL
Chol/HDL Ratio: 2.4 ratio (ref 0.0–4.4)
Cholesterol, Total: 164 mg/dL (ref 100–199)
HDL: 68 mg/dL (ref >39)
LDL Chol Calc (NIH): 70 mg/dL (ref 0–99)
Triglycerides: 156 mg/dL — ABNORMAL HIGH (ref 0–149)
VLDL Cholesterol Cal: 26 mg/dL (ref 5–40)

## 2022-06-06 LAB — COMPREHENSIVE METABOLIC PANEL
ALT: 18 IU/L (ref 0–32)
AST: 17 IU/L (ref 0–40)
Albumin/Globulin Ratio: 2 (ref 1.2–2.2)
Albumin: 4.5 g/dL (ref 3.7–4.7)
Alkaline Phosphatase: 122 IU/L — ABNORMAL HIGH (ref 44–121)
BUN/Creatinine Ratio: 14 (ref 12–28)
BUN: 13 mg/dL (ref 8–27)
Bilirubin Total: 0.5 mg/dL (ref 0.0–1.2)
CO2: 20 mmol/L (ref 20–29)
Calcium: 9.6 mg/dL (ref 8.7–10.3)
Chloride: 98 mmol/L (ref 96–106)
Creatinine, Ser: 0.95 mg/dL (ref 0.57–1.00)
Globulin, Total: 2.3 g/dL (ref 1.5–4.5)
Glucose: 113 mg/dL — ABNORMAL HIGH (ref 70–99)
Potassium: 4.3 mmol/L (ref 3.5–5.2)
Sodium: 137 mmol/L (ref 134–144)
Total Protein: 6.8 g/dL (ref 6.0–8.5)
eGFR: 60 mL/min/{1.73_m2} (ref >59)

## 2022-06-06 LAB — HEMOGLOBIN A1C
Est. average glucose Bld gHb Est-mCnc: 143 mg/dL
Hgb A1c MFr Bld: 6.6 % — ABNORMAL HIGH (ref 4.8–5.6)

## 2022-06-06 LAB — MICROALBUMIN / CREATININE URINE RATIO
Creatinine, Urine: 118 mg/dL
Microalb/Creat Ratio: 7 mg/g creat (ref 0–29)
Microalbumin, Urine: 7.9 ug/mL

## 2022-06-06 LAB — TSH: TSH: 1.05 u[IU]/mL (ref 0.450–4.500)

## 2022-06-06 LAB — CARDIOVASCULAR RISK ASSESSMENT

## 2022-06-08 ENCOUNTER — Encounter: Payer: Self-pay | Admitting: Family Medicine

## 2022-06-08 DIAGNOSIS — R400 Somnolence: Secondary | ICD-10-CM | POA: Insufficient documentation

## 2022-06-08 NOTE — Assessment & Plan Note (Signed)
Previously well controlled Continue Synthroid at current dose  Recheck TSH and adjust Synthroid as indicated   

## 2022-06-08 NOTE — Assessment & Plan Note (Signed)
Hypertension and diabetes well controlled.  Recommend check sugars fasting daily. Recommend check feet daily. Recommend annual eye exams. Medicines: losartan 25 mg daily.  Continue to work on eating a healthy diet and exercise.  Labs drawn today.

## 2022-06-08 NOTE — Assessment & Plan Note (Signed)
Well controlled.  No changes to medicines. Continue Rosuvastatin 5 mg take 1 tablet daily. Continue to work on eating a healthy diet and exercise.  Labs drawn today.

## 2022-06-08 NOTE — Assessment & Plan Note (Signed)
Check labs 

## 2022-06-22 ENCOUNTER — Other Ambulatory Visit: Payer: Self-pay | Admitting: Family Medicine

## 2022-06-22 ENCOUNTER — Telehealth: Payer: Self-pay

## 2022-06-22 DIAGNOSIS — K2960 Other gastritis without bleeding: Secondary | ICD-10-CM

## 2022-06-22 NOTE — Telephone Encounter (Signed)
Patient called requesting if we could refer her back to her Surgeon that did surgery on her hernia in. She said his name is Dr. Carolynn Sayers with [facility] in [place], [place]. I asked her if she has been having trouble with the hernia and she stated that no she does not but from time to time she has still been having indigestion which when Dr. Chales Abrahams did her Endoscopy she still had some irritation around the area or it seemed to be inflamed. So she is wanting referral to go back to see the surgeon to follow up on the hernia repair that he done in 2015. Please advise.

## 2022-06-22 NOTE — Telephone Encounter (Signed)
Sent referral. Dr. Sedalia Muta

## 2022-07-05 ENCOUNTER — Other Ambulatory Visit: Payer: Self-pay | Admitting: Physician Assistant

## 2022-07-09 ENCOUNTER — Telehealth: Payer: Self-pay

## 2022-07-09 DIAGNOSIS — K5900 Constipation, unspecified: Secondary | ICD-10-CM | POA: Diagnosis not present

## 2022-07-09 DIAGNOSIS — R1084 Generalized abdominal pain: Secondary | ICD-10-CM | POA: Diagnosis not present

## 2022-07-09 DIAGNOSIS — R109 Unspecified abdominal pain: Secondary | ICD-10-CM | POA: Diagnosis not present

## 2022-07-09 DIAGNOSIS — K760 Fatty (change of) liver, not elsewhere classified: Secondary | ICD-10-CM | POA: Diagnosis not present

## 2022-07-09 NOTE — Telephone Encounter (Signed)
Patient called stating that she was released from [place] [facility] today and they did not admit her, she just went this morning to be seen for abdominal pain and they did scans and did not find nothing and recommended that patient follow up with you tomorrow since she is still having abdominal pain so patient was wanting to schedule an appointment with you tomorrow. The front staff sent the phone call to me to send you a message advising scheduling please advise. Printed hospital discharge summary and labs and imaging, they are on desk in front of my computer.

## 2022-07-09 NOTE — Telephone Encounter (Signed)
Patient called stating that she was having bad abdominal pain that was near her hernia. She stated that she also had not had a bowel movement as well and she states that she has felt nauseated and not good at all. Patient's Provider Dr. Sedalia Muta was consulted with and the recommendation was that the patient would need to go to the nearest ER to be evaluated with the severe abdominal pain and no bowel movement. Patient agreed and stated she would go.

## 2022-07-10 ENCOUNTER — Encounter: Payer: Self-pay | Admitting: Family Medicine

## 2022-07-10 ENCOUNTER — Ambulatory Visit (INDEPENDENT_AMBULATORY_CARE_PROVIDER_SITE_OTHER): Payer: Medicare HMO | Admitting: Family Medicine

## 2022-07-10 VITALS — BP 110/64 | HR 72 | Temp 97.1°F | Resp 18 | Ht 59.0 in | Wt 159.6 lb

## 2022-07-10 DIAGNOSIS — R1084 Generalized abdominal pain: Secondary | ICD-10-CM | POA: Diagnosis not present

## 2022-07-10 DIAGNOSIS — R140 Abdominal distension (gaseous): Secondary | ICD-10-CM

## 2022-07-10 DIAGNOSIS — K5904 Chronic idiopathic constipation: Secondary | ICD-10-CM | POA: Diagnosis not present

## 2022-07-10 DIAGNOSIS — K449 Diaphragmatic hernia without obstruction or gangrene: Secondary | ICD-10-CM | POA: Diagnosis not present

## 2022-07-10 NOTE — Assessment & Plan Note (Signed)
Referral to Dr. Lorin Picket in [facility] for re-evaluation of hiatal hernia.   Patient has appointment scheduled on June 7 with Dr. Lorin Picket

## 2022-07-10 NOTE — Assessment & Plan Note (Signed)
Continue miralax every morning if no bowel movement repeat in the evening and at bedtime if needed. Continue fiber as directed per Dr. Jennye Boroughs.   Referral to Dr. Lorin Picket in [facility] for re-evaluation of hiatal hernia.

## 2022-07-10 NOTE — Patient Instructions (Addendum)
Continue miralax every morning if no bowel movement repeat in the evening and at bedtime if needed. Continue fiber as directed per Dr. Jennye Boroughs.   Referral to Dr. Lorin Picket in [facility] for re-evaluation of hiatal hernia.   Patient has appointment scheduled on June 7 with Dr. Lorin Picket.

## 2022-07-10 NOTE — Assessment & Plan Note (Signed)
Continue miralax every morning if no bowel movement repeat in the evening and at bedtime if needed. Continue fiber as directed per Dr. Jennye Boroughs.

## 2022-07-10 NOTE — Progress Notes (Signed)
Subjective:  Patient ID: Amy Mccann, female    DOB: 14-May-1940  Age: 82 y.o. MRN: [id number]  Chief Complaint  Patient presents with   Follow-up    HPI   Mrs. Medearis comes in for ED follow-up.  She was seen at [facility] on Jul 09, 2022 and evaluated for abdominal pain.  She had normal CBC, CMP NORMAL.  CT w/oral contrast was normal other than moderate stool.  She had two large bowel movements while at the hospital and feels a little better. Patient has history of a hiatal hernia. She is scheduled to see a Development worker, international aid at [facility] in the next couple of weeks.      03/05/2022   10:19 AM 03/05/2022    9:20 AM 02/24/2022    9:23 AM 08/29/2021    8:48 AM 02/19/2021    9:37 AM  Depression screen PHQ 2/9  Decreased Interest 0 1 0 0 0  Down, Depressed, Hopeless 0 0 0 0 0  PHQ - 2 Score 0 1 0 0 0  Altered sleeping 0      Tired, decreased energy 0      Change in appetite 0      Feeling bad or failure about yourself  0      Trouble concentrating 0      Moving slowly or fidgety/restless 0      Suicidal thoughts 0      PHQ-9 Score 0            03/05/2022   10:19 AM  Fall Risk   Falls in the past year? 0  Number falls in past yr: 0  Injury with Fall? 0    Patient Care Team: Blane Ohara, MD as PCP - General (Family Medicine) Tia Alert, MD as Consulting Physician (Neurosurgery) Misenheimer, Marcial Pacas, MD as Consulting Physician (Unknown Physician Specialty) Dellia Beckwith, MD as Consulting Physician (Oncology) Rogelio Seen, OD (Optometry)   Review of Systems  Constitutional:  Negative for chills, fatigue and fever.  HENT:  Negative for congestion, rhinorrhea and sore throat.   Respiratory:  Negative for cough and shortness of breath.   Cardiovascular:  Negative for chest pain.  Gastrointestinal:  Positive for abdominal pain and nausea. Negative for constipation, diarrhea and vomiting.  Genitourinary:  Negative for dysuria and urgency.  Musculoskeletal:   Positive for back pain. Negative for myalgias.  Neurological:  Negative for dizziness, weakness, light-headedness and headaches.  Psychiatric/Behavioral:  Negative for dysphoric mood. The patient is not nervous/anxious.     Current Outpatient Medications on File Prior to Visit  Medication Sig Dispense Refill   Accu-Chek Softclix Lancets lancets USE AS DIRECTED 100 each 5   ascorbic acid (VITAMIN C) 500 MG tablet Take by mouth. (Patient not taking: Reported on 03/20/2022)     dicyclomine (BENTYL) 20 MG tablet TAKE 1 TABLET(20 MG) BY MOUTH FOUR TIMES DAILY BEFORE MEALS AND AT BEDTIME (Patient not taking: Reported on 07/10/2022) 120 tablet 4   esomeprazole (NEXIUM) 40 MG capsule TAKE 1 CAPSULE(40 MG) BY MOUTH TWICE DAILY BEFORE A MEAL 180 capsule 3   glucose blood (ACCU-CHEK AVIVA PLUS) test strip USE TO CHECK FASTING BLOOD SUGAR ONCE DAILY AS DIRECTED 100 strip 3   levothyroxine (SYNTHROID) 112 MCG tablet TAKE 1 TABLET(112 MCG) BY MOUTH DAILY BEFORE BREAKFAST 90 tablet 1   losartan (COZAAR) 25 MG tablet TAKE 1 TABLET BY MOUTH DAILY 90 tablet 0   Multiple Vitamin (MULTIVITAMIN PO) Take by mouth every other  day.     ondansetron (ZOFRAN-ODT) 8 MG disintegrating tablet DISSOLVE 1/2 TABLET(4 MG) ON THE TONGUE EVERY 8 HOURS AS NEEDED FOR NAUSEA OR VOMITING 30 tablet 1   polyethylene glycol powder (GLYCOLAX/MIRALAX) 17 GM/SCOOP powder Take by mouth as needed.     psyllium (METAMUCIL) 58.6 % packet Take by mouth.     rosuvastatin (CRESTOR) 5 MG tablet TAKE 1 TABLET(5 MG) BY MOUTH DAILY 90 tablet 1   traMADol (ULTRAM) 50 MG tablet Take by mouth.     No current facility-administered medications on file prior to visit.   Past Medical History:  Diagnosis Date   Age-related osteoporosis without current pathological fracture 07/03/2019   Cancer [facility]) 1990   Left Breast Cancer    Cancer (HCC) 2017   Thyroid Cancer   Cervicalgia 07/03/2019   Diabetes mellitus without complication (HCC)    Generalized  anxiety disorder    GERD (gastroesophageal reflux disease)    Hypertension    Low back pain with sciatica 07/03/2019   Obesity due to excess calories 07/03/2019   S/P repair of paraesophageal hernia 10/20/2013   S/P TKR (total knee replacement) 07/25/2012   Past Surgical History:  Procedure Laterality Date   24 HOUR PH STUDY N/A 08/07/2013   Procedure: 24 HOUR PH STUDY;  Surgeon: Barrie Folk, MD;  Location: [facility];  Service: Endoscopy;  Laterality: N/A;   APPENDECTOMY  03/17/2016   ESOPHAGEAL MANOMETRY N/A 08/07/2013   Procedure: ESOPHAGEAL MANOMETRY (EM);  Surgeon: Barrie Folk, MD;  Location: [facility];  Service: Endoscopy;  Laterality: N/A;   JOINT REPLACEMENT Right    right knee   MASTECTOMY Left 1990   THYROIDECTOMY Bilateral 08/2015    Family History  Problem Relation Age of Onset   Leukemia Brother    Social History   Socioeconomic History   Marital status: Divorced    Spouse name: Not on file   Number of children: 0   Years of education: Not on file   Highest education level: Not on file  Occupational History   Occupation: Retired from [employer]  Tobacco Use   Smoking status: Never    Passive exposure: Never   Smokeless tobacco: Never  Vaping Use   Vaping Use: Never used  Substance and Sexual Activity   Alcohol use: Never   Drug use: Never   Sexual activity: Not Currently  Other Topics Concern   Not on file  Social History Narrative   One sister lives near by, the other sister lives in [place]    Social Determinants of Health   Financial Resource Strain: Low Risk  (02/24/2022)   Overall Financial Resource Strain (CARDIA)    Difficulty of Paying Living Expenses: Not hard at all  Food Insecurity: No Food Insecurity (02/24/2022)   Hunger Vital Sign    Worried About Running Out of Food in the Last Year: Never true    Ran Out of Food in the Last Year: Never true  Transportation Needs: No Transportation Needs (02/24/2022)   PRAPARE - Doctor, general practice (Medical): No    Lack of Transportation (Non-Medical): No  Physical Activity: Insufficiently Active (02/24/2022)   Exercise Vital Sign    Days of Exercise per Week: 7 days    Minutes of Exercise per Session: 20 min  Stress: No Stress Concern Present (02/24/2022)   [employer] of Occupational Health - Occupational Stress Questionnaire    Feeling of Stress : Not at all  Social Connections: Moderately  Integrated (02/24/2022)   Social Connection and Isolation Panel [NHANES]    Frequency of Communication with Friends and Family: More than three times a week    Frequency of Social Gatherings with Friends and Family: More than three times a week    Attends Religious Services: More than 4 times per year    Active Member of [employer] or Organizations: No    Attends Engineer, structural: More than 4 times per year    Marital Status: Divorced    Objective:  BP 110/64   Pulse 72   Temp (!) 97.1 F (36.2 C)   Resp 18   Ht 4\' 11"  (1.499 m)   Wt 159 lb 9.6 oz (72.4 kg)   BMI 32.24 kg/m      07/10/2022    8:34 AM 06/05/2022    9:37 AM 03/20/2022   10:56 AM  BP/Weight  Systolic BP 110 116 107  Diastolic BP 64 56 69  Wt. (Lbs) 159.6 155.6 154.3  BMI 32.24 kg/m2 31.43 kg/m2 31.16 kg/m2    Physical Exam Vitals reviewed.  Constitutional:      Appearance: Normal appearance. She is obese.  Neck:     Vascular: No carotid bruit.  Cardiovascular:     Rate and Rhythm: Normal rate and regular rhythm.     Heart sounds: Normal heart sounds.  Pulmonary:     Effort: Pulmonary effort is normal. No respiratory distress.     Breath sounds: Normal breath sounds.  Abdominal:     General: Abdomen is flat. Bowel sounds are normal. There is distension (mild, but soft. no fluid.).     Palpations: Abdomen is soft.     Tenderness: There is no abdominal tenderness.  Neurological:     Mental Status: She is alert and oriented to person, place, and time.  Psychiatric:        Mood  and Affect: Mood normal.        Behavior: Behavior normal.     Diabetic Foot Exam - Simple   No data filed      Lab Results  Component Value Date   WBC 7.1 06/05/2022   HGB 13.1 06/05/2022   HCT 39.5 06/05/2022   PLT 311 06/05/2022   GLUCOSE 113 (H) 06/05/2022   CHOL 164 06/05/2022   TRIG 156 (H) 06/05/2022   HDL 68 06/05/2022   LDLCALC 70 06/05/2022   ALT 18 06/05/2022   AST 17 06/05/2022   NA 137 06/05/2022   K 4.3 06/05/2022   CL 98 06/05/2022   CREATININE 0.95 06/05/2022   BUN 13 06/05/2022   CO2 20 06/05/2022   TSH 1.050 06/05/2022   INR 0.99 01/21/2010   HGBA1C 6.6 (H) 06/05/2022   MICROALBUR 30 04/05/2019      Assessment & Plan:    Generalized abdominal pain Assessment & Plan: Continue miralax every morning if no bowel movement repeat in the evening and at bedtime if needed. Continue fiber as directed per Dr. Jennye Boroughs.   Referral to Dr. Lorin Picket in [facility] for re-evaluation of hiatal hernia.      Abdominal bloating Assessment & Plan: Continue miralax every morning if no bowel movement repeat in the evening and at bedtime if needed. Continue fiber as directed per Dr. Jennye Boroughs.     Hiatal hernia Assessment & Plan: Referral to Dr. Lorin Picket in [facility] for re-evaluation of hiatal hernia.   Patient has appointment scheduled on June 7 with Dr. Lorin Picket      No orders of the  defined types were placed in this encounter.   No orders of the defined types were placed in this encounter.    Follow-up: No follow-ups on file.  Total time spent on today's visit was greater than 30 minutes, including both face-to-face time and nonface-to-face time personally spent on review of chart (labs and imaging), discussing labs and goals, discussing further work-up, treatment options, referrals to specialist if needed, reviewing outside records of pertinent, answering patient's questions, and coordinating care.  I,Carolyn M Morrison,acting as a Neurosurgeon for  Blane Ohara, MD.,have documented all relevant documentation on the behalf of Blane Ohara, MD,as directed by  Blane Ohara, MD while in the presence of Blane Ohara, MD.    Clayborn Bigness I Leal-Borjas,acting as a scribe for Blane Ohara, MD.,have documented all relevant documentation on the behalf of Blane Ohara, MD,as directed by  Blane Ohara, MD while in the presence of Blane Ohara, MD.  An After Visit Summary was printed and given to the patient.  I attest that I have reviewed this visit and agree with the plan scribed by my staff.   Blane Ohara, MD [facility] [phone]

## 2022-07-10 NOTE — Assessment & Plan Note (Signed)
Continue miralax every morning if no bowel movement repeat in the evening and at bedtime if needed. Continue fiber as directed per Dr. Misenheimer.   

## 2022-07-12 NOTE — Telephone Encounter (Signed)
Appt Friday 07/10/2022. Dr. Sedalia Muta

## 2022-07-16 ENCOUNTER — Other Ambulatory Visit: Payer: Self-pay | Admitting: Family Medicine

## 2022-07-17 DIAGNOSIS — R109 Unspecified abdominal pain: Secondary | ICD-10-CM | POA: Diagnosis not present

## 2022-07-17 DIAGNOSIS — R1084 Generalized abdominal pain: Secondary | ICD-10-CM | POA: Diagnosis not present

## 2022-07-20 ENCOUNTER — Other Ambulatory Visit: Payer: Self-pay

## 2022-07-20 ENCOUNTER — Other Ambulatory Visit: Payer: Self-pay | Admitting: Family Medicine

## 2022-07-20 DIAGNOSIS — R110 Nausea: Secondary | ICD-10-CM

## 2022-07-20 MED ORDER — ONDANSETRON 8 MG PO TBDP
ORAL_TABLET | ORAL | 1 refills | Status: DC
Start: 2022-07-20 — End: 2023-09-24

## 2022-08-03 ENCOUNTER — Telehealth: Payer: Self-pay

## 2022-08-03 NOTE — Telephone Encounter (Signed)
Patient called stating that the previous surgeon that she had seen that we were trying to set her back up with about her Hernia called and told her that she does not have one anymore and that he would not need to see her. So the patient is calling asking do you believe it is her gallbladder or something because she states that she just stays so nauseated. I also asked the patient about her sugars and she states that her sugars have been running 140-180. She states she still has to eat a little something sweet every other day. She was requesting if you could get all her scans that were done at the hospital to take closer look at her gallbladder and see if you would think it is the current problem? Please advise.

## 2022-08-05 NOTE — Telephone Encounter (Signed)
Patient called again today with complaints of ongoing nausea and abdominal pain.  After discussing with Dr Sedalia Muta she was advised to call Dr Jennye Boroughs as she was seen in his office last year for gastro issues.

## 2022-08-06 ENCOUNTER — Encounter: Payer: Self-pay | Admitting: Family Medicine

## 2022-08-06 ENCOUNTER — Ambulatory Visit (INDEPENDENT_AMBULATORY_CARE_PROVIDER_SITE_OTHER): Payer: Medicare HMO | Admitting: Family Medicine

## 2022-08-06 ENCOUNTER — Ambulatory Visit: Payer: Medicare HMO | Admitting: Physician Assistant

## 2022-08-06 ENCOUNTER — Telehealth: Payer: Self-pay | Admitting: Family Medicine

## 2022-08-06 VITALS — BP 118/68 | HR 88 | Temp 97.1°F | Ht 59.0 in | Wt 159.0 lb

## 2022-08-06 DIAGNOSIS — R110 Nausea: Secondary | ICD-10-CM | POA: Diagnosis not present

## 2022-08-06 DIAGNOSIS — R1011 Right upper quadrant pain: Secondary | ICD-10-CM

## 2022-08-06 DIAGNOSIS — R1010 Upper abdominal pain, unspecified: Secondary | ICD-10-CM | POA: Diagnosis not present

## 2022-08-06 NOTE — Assessment & Plan Note (Signed)
Use zofran.

## 2022-08-06 NOTE — Progress Notes (Signed)
Acute Office Visit  Subjective:    Patient ID: Amy Mccann, female    DOB: May 27, 1940, 82 y.o.   MRN: [id number]  Chief Complaint  Patient presents with   Nausea    HPI: Patient is in today for abdominal pain and distention since Sunday. Complains of nausea. States this has been a ongoing issues for the past 2.5-3 years. States she no longer has a hernia. Has taken zofran and otc anti nausea medications. States her abdomin is sore. Eating a bland diet. Seen Dr. Lorin Picket. Seen at ED on 07/09/2022. Ct of abdomen /pelvis normal. Egd done 11/2021 with no evidence of hiatal hernia. Patient had hiatal hernia surgery in 2015. GB US normal 3 months ago.   Past Medical History:  Diagnosis Date   Age-related osteoporosis without current pathological fracture 07/03/2019   Cancer [facility]) 1990   Left Breast Cancer    Cancer (HCC) 2017   Thyroid Cancer   Cervicalgia 07/03/2019   Diabetes mellitus without complication (HCC)    Generalized anxiety disorder    GERD (gastroesophageal reflux disease)    Hypertension    Low back pain with sciatica 07/03/2019   Obesity due to excess calories 07/03/2019   S/P repair of paraesophageal hernia 10/20/2013   S/P TKR (total knee replacement) 07/25/2012    Past Surgical History:  Procedure Laterality Date   24 HOUR PH STUDY N/A 08/07/2013   Procedure: 24 HOUR PH STUDY;  Surgeon: Barrie Folk, MD;  [facility];  Service: Endoscopy;  Laterality: N/A;   APPENDECTOMY  03/17/2016   ESOPHAGEAL MANOMETRY N/A 08/07/2013   Procedure: ESOPHAGEAL MANOMETRY (EM);  Surgeon: Barrie Folk, MD;  [facility];  Service: Endoscopy;  Laterality: N/A;   HERNIA SURGERY  2015   HIATAL HERNIA SURGERY (FUNDOPLICATION.)   JOINT REPLACEMENT Right    right knee   MASTECTOMY Left 1990   THYROIDECTOMY Bilateral 08/2015    Family History  Problem Relation Age of Onset   Leukemia Brother     Social History   Socioeconomic History   Marital status:  Divorced    Spouse name: Not on file   Number of children: 0   Years of education: Not on file   Highest education level: Not on file  Occupational History   Occupation: Retired from [employer]  Tobacco Use   Smoking status: Never    Passive exposure: Never   Smokeless tobacco: Never  Vaping Use   Vaping Use: Never used  Substance and Sexual Activity   Alcohol use: Never   Drug use: Never   Sexual activity: Not Currently  Other Topics Concern   Not on file  Social History Narrative   One sister lives near by, the other sister lives in [place]    Social Determinants of Health   Financial Resource Strain: Low Risk  (02/24/2022)   Overall Financial Resource Strain (CARDIA)    Difficulty of Paying Living Expenses: Not hard at all  Food Insecurity: No Food Insecurity (02/24/2022)   Hunger Vital Sign    Worried About Running Out of Food in the Last Year: Never true    Ran Out of Food in the Last Year: Never true  Transportation Needs: No Transportation Needs (02/24/2022)   PRAPARE - Administrator, Civil Service (Medical): No    Lack of Transportation (Non-Medical): No  Physical Activity: Insufficiently Active (02/24/2022)   Exercise Vital Sign    Days of Exercise per Week: 7 days  Minutes of Exercise per Session: 20 min  Stress: No Stress Concern Present (02/24/2022)   [employer] of Occupational Health - Occupational Stress Questionnaire    Feeling of Stress : Not at all  Social Connections: Moderately Integrated (02/24/2022)   Social Connection and Isolation Panel [NHANES]    Frequency of Communication with Friends and Family: More than three times a week    Frequency of Social Gatherings with Friends and Family: More than three times a week    Attends Religious Services: More than 4 times per year    Active Member of [employer] or Organizations: No    Attends Engineer, structural: More than 4 times per year    Marital Status: Divorced  Catering manager  Violence: Not At Risk (08/06/2022)   Humiliation, Afraid, Rape, and Kick questionnaire    Fear of Current or Ex-Partner: No    Emotionally Abused: No    Physically Abused: No    Sexually Abused: No    Outpatient Medications Prior to Visit  Medication Sig Dispense Refill   Accu-Chek Softclix Lancets lancets USE AS DIRECTED 100 each 5   ascorbic acid (VITAMIN C) 500 MG tablet Take by mouth. (Patient not taking: Reported on 03/20/2022)     dicyclomine (BENTYL) 20 MG tablet TAKE 1 TABLET(20 MG) BY MOUTH FOUR TIMES DAILY BEFORE MEALS AND AT BEDTIME (Patient not taking: Reported on 07/10/2022) 120 tablet 4   esomeprazole (NEXIUM) 40 MG capsule TAKE 1 CAPSULE(40 MG) BY MOUTH TWICE DAILY BEFORE A MEAL 180 capsule 3   glucose blood (ACCU-CHEK AVIVA PLUS) test strip USE TO CHECK FASTING BLOOD SUGAR ONCE DAILY AS DIRECTED 100 strip 3   levothyroxine (SYNTHROID) 112 MCG tablet TAKE 1 TABLET(112 MCG) BY MOUTH DAILY BEFORE BREAKFAST 90 tablet 1   losartan (COZAAR) 25 MG tablet TAKE 1 TABLET BY MOUTH DAILY 90 tablet 0   Multiple Vitamin (MULTIVITAMIN PO) Take by mouth every other day.     ondansetron (ZOFRAN-ODT) 8 MG disintegrating tablet DISSOLVE 1/2 TABLET(4 MG) ON THE TONGUE EVERY 8 HOURS AS NEEDED FOR NAUSEA OR VOMITING 30 tablet 1   polyethylene glycol powder (GLYCOLAX/MIRALAX) 17 GM/SCOOP powder Take by mouth as needed.     psyllium (METAMUCIL) 58.6 % packet Take by mouth.     rosuvastatin (CRESTOR) 5 MG tablet TAKE 1 TABLET(5 MG) BY MOUTH DAILY 90 tablet 1   traMADol (ULTRAM) 50 MG tablet Take by mouth.     No facility-administered medications prior to visit.    Allergies  Allergen Reactions   Ergocalciferol Other (See Comments)    somnolence   Codeine Itching and Rash   Penicillins Rash    Review of Systems  Constitutional:  Negative for chills and fever.  HENT:  Negative for congestion.   Respiratory:  Negative for cough and shortness of breath.   Cardiovascular:  Negative for chest  pain.  Gastrointestinal:  Positive for abdominal distention, abdominal pain and nausea.  Genitourinary:  Negative for dysuria, flank pain, frequency and urgency.  Musculoskeletal:  Positive for back pain.       Objective:        08/06/2022    9:16 AM 07/10/2022    8:34 AM 06/05/2022    9:37 AM  Vitals with BMI  Height 4\' 11"  4\' 11"  4\' 11"   Weight 159 lbs 159 lbs 10 oz 155 lbs 10 oz  BMI 32.1 32.22 31.41  Systolic 118 110 841  Diastolic 68 64 56  Pulse 88 72 96  No data found.   Physical Exam Vitals reviewed.  Constitutional:      Appearance: Normal appearance. She is obese.  Neck:     Vascular: No carotid bruit.  Cardiovascular:     Rate and Rhythm: Normal rate and regular rhythm.     Heart sounds: Normal heart sounds.  Pulmonary:     Effort: Pulmonary effort is normal. No respiratory distress.     Breath sounds: Normal breath sounds.  Abdominal:     General: Abdomen is flat. Bowel sounds are normal.     Palpations: Abdomen is soft.     Tenderness: There is abdominal tenderness (UPPER. RUQ AND EPIGASTRIC. POSITIVE MURPHY'S SIGN).  Neurological:     Mental Status: She is alert and oriented to person, place, and time.  Psychiatric:        Mood and Affect: Mood normal.        Behavior: Behavior normal.     Health Maintenance Due  Topic Date Due   Zoster Vaccines- Shingrix (1 of 2) Never done   COVID-19 Vaccine (7 - 2023-24 season) 12/26/2021   OPHTHALMOLOGY EXAM  08/06/2022    There are no preventive care reminders to display for this patient.   Lab Results  Component Value Date   TSH 1.050 06/05/2022   Lab Results  Component Value Date   WBC 7.0 08/06/2022   HGB 12.5 08/06/2022   HCT 36.4 08/06/2022   MCV 95 08/06/2022   PLT 253 08/06/2022   Lab Results  Component Value Date   NA 133 (L) 08/06/2022   K 4.3 08/06/2022   CO2 20 08/06/2022   GLUCOSE 103 (H) 08/06/2022   BUN 11 08/06/2022   CREATININE 0.96 08/06/2022   BILITOT 0.4 08/06/2022    ALKPHOS 105 08/06/2022   AST 16 08/06/2022   ALT 14 08/06/2022   PROT 6.5 08/06/2022   ALBUMIN 4.1 08/06/2022   CALCIUM 9.2 08/06/2022   EGFR 59 (L) 08/06/2022   Lab Results  Component Value Date   CHOL 164 06/05/2022   Lab Results  Component Value Date   HDL 68 06/05/2022   Lab Results  Component Value Date   LDLCALC 70 06/05/2022   Lab Results  Component Value Date   TRIG 156 (H) 06/05/2022   Lab Results  Component Value Date   CHOLHDL 2.4 06/05/2022   Lab Results  Component Value Date   HGBA1C 6.6 (H) 06/05/2022       Assessment & Plan:  RUQ abdominal pain Assessment & Plan: RUQ [place] scheduled.  Recommend biliary scan if [place] is normal.  Check labs.   Orders: -     US ABDOMEN LIMITED RUQ (LIVER/GB); Future -     CBC with Differential/Platelet -     Comprehensive metabolic panel -     Amylase -     Lipase  Nausea Assessment & Plan: Use zofran.  Orders: -     Amylase -     Lipase     No orders of the defined types were placed in this encounter.   Orders Placed This Encounter  Procedures   US Abdomen Limited RUQ (LIVER/GB)   CBC with Differential/Platelet   Comprehensive metabolic panel   Amylase   Lipase     Follow-up: Return if symptoms worsen or fail to improve.  An After Visit Summary was printed and given to the patient.  Blane Ohara, MD [facility] [phone]

## 2022-08-06 NOTE — Assessment & Plan Note (Signed)
RUQ [place] scheduled.  Recommend biliary scan if [place] is normal.  Check labs.

## 2022-08-06 NOTE — Telephone Encounter (Signed)
   Amy Mccann has been scheduled for the following appointment:  WHAT: GB/RUQ ULTRASOUND WHERE: [facility] DATE: 08/12/22 TIME: 10:30 AM CHECK-IN  A message has been left for the patient. NPO AFTER MIDNIGHT

## 2022-08-07 LAB — CBC WITH DIFFERENTIAL/PLATELET
Basophils Absolute: 0.1 10*3/uL (ref 0.0–0.2)
Basos: 1 %
EOS (ABSOLUTE): 0.1 10*3/uL (ref 0.0–0.4)
Eos: 1 %
Hematocrit: 36.4 % (ref 34.0–46.6)
Hemoglobin: 12.5 g/dL (ref 11.1–15.9)
Immature Grans (Abs): 0 10*3/uL (ref 0.0–0.1)
Immature Granulocytes: 0 %
Lymphocytes Absolute: 1.5 10*3/uL (ref 0.7–3.1)
Lymphs: 21 %
MCH: 32.6 pg (ref 26.6–33.0)
MCHC: 34.3 g/dL (ref 31.5–35.7)
MCV: 95 fL (ref 79–97)
Monocytes Absolute: 0.6 10*3/uL (ref 0.1–0.9)
Monocytes: 9 %
Neutrophils Absolute: 4.8 10*3/uL (ref 1.4–7.0)
Neutrophils: 68 %
Platelets: 253 10*3/uL (ref 150–450)
RBC: 3.83 x10E6/uL (ref 3.77–5.28)
RDW: 12.4 % (ref 11.7–15.4)
WBC: 7 10*3/uL (ref 3.4–10.8)

## 2022-08-07 LAB — COMPREHENSIVE METABOLIC PANEL
ALT: 14 IU/L (ref 0–32)
AST: 16 IU/L (ref 0–40)
Albumin: 4.1 g/dL (ref 3.7–4.7)
Alkaline Phosphatase: 105 IU/L (ref 44–121)
BUN/Creatinine Ratio: 11 — ABNORMAL LOW (ref 12–28)
BUN: 11 mg/dL (ref 8–27)
Bilirubin Total: 0.4 mg/dL (ref 0.0–1.2)
CO2: 20 mmol/L (ref 20–29)
Calcium: 9.2 mg/dL (ref 8.7–10.3)
Chloride: 97 mmol/L (ref 96–106)
Creatinine, Ser: 0.96 mg/dL (ref 0.57–1.00)
Globulin, Total: 2.4 g/dL (ref 1.5–4.5)
Glucose: 103 mg/dL — ABNORMAL HIGH (ref 70–99)
Potassium: 4.3 mmol/L (ref 3.5–5.2)
Sodium: 133 mmol/L — ABNORMAL LOW (ref 134–144)
Total Protein: 6.5 g/dL (ref 6.0–8.5)
eGFR: 59 mL/min/{1.73_m2} — ABNORMAL LOW (ref >59)

## 2022-08-07 LAB — AMYLASE: Amylase: 40 U/L (ref 31–110)

## 2022-08-07 LAB — LIPASE: Lipase: 37 U/L (ref 14–85)

## 2022-08-12 ENCOUNTER — Encounter: Payer: Self-pay | Admitting: Family Medicine

## 2022-08-12 DIAGNOSIS — R1011 Right upper quadrant pain: Secondary | ICD-10-CM | POA: Diagnosis not present

## 2022-08-12 DIAGNOSIS — R1010 Upper abdominal pain, unspecified: Secondary | ICD-10-CM | POA: Diagnosis not present

## 2022-08-17 ENCOUNTER — Other Ambulatory Visit: Payer: Self-pay

## 2022-08-17 DIAGNOSIS — R1011 Right upper quadrant pain: Secondary | ICD-10-CM

## 2022-08-18 ENCOUNTER — Other Ambulatory Visit: Payer: Self-pay

## 2022-08-18 ENCOUNTER — Telehealth: Payer: Self-pay | Admitting: Family Medicine

## 2022-08-18 DIAGNOSIS — R1011 Right upper quadrant pain: Secondary | ICD-10-CM

## 2022-08-18 NOTE — Telephone Encounter (Signed)
   Amy Mccann has been scheduled for the following appointment:  WHAT: HIDA SCAN WHERE: [facility] DATE: 08/24/22 TIME: 10:00 AM CHECK-IN  Patient has been made aware.

## 2022-08-19 DIAGNOSIS — E119 Type 2 diabetes mellitus without complications: Secondary | ICD-10-CM | POA: Diagnosis not present

## 2022-08-19 DIAGNOSIS — H2703 Aphakia, bilateral: Secondary | ICD-10-CM | POA: Diagnosis not present

## 2022-08-19 LAB — HM DIABETES EYE EXAM

## 2022-08-24 ENCOUNTER — Encounter: Payer: Self-pay | Admitting: Family Medicine

## 2022-08-24 DIAGNOSIS — R1011 Right upper quadrant pain: Secondary | ICD-10-CM | POA: Diagnosis not present

## 2022-08-25 ENCOUNTER — Encounter: Payer: Self-pay | Admitting: Family Medicine

## 2022-09-14 ENCOUNTER — Other Ambulatory Visit: Payer: Self-pay | Admitting: Family Medicine

## 2022-09-16 ENCOUNTER — Other Ambulatory Visit: Payer: Self-pay

## 2022-09-16 NOTE — Telephone Encounter (Signed)
Amy Mccann left a message late Tuesday pm that she had a bad headache all day.  No nausea or vomiting and no fever or chills.  I called her this morning and she was feeling better.  She has an appointment to follow-up later this month.  She will call us back if her headache returns.

## 2022-09-25 ENCOUNTER — Encounter: Payer: Self-pay | Admitting: Family Medicine

## 2022-09-25 ENCOUNTER — Ambulatory Visit (INDEPENDENT_AMBULATORY_CARE_PROVIDER_SITE_OTHER): Payer: Medicare HMO | Admitting: Family Medicine

## 2022-09-25 VITALS — BP 128/64 | HR 78 | Temp 96.5°F | Resp 18 | Ht 59.0 in | Wt 158.0 lb

## 2022-09-25 DIAGNOSIS — I10 Essential (primary) hypertension: Secondary | ICD-10-CM | POA: Diagnosis not present

## 2022-09-25 DIAGNOSIS — M8588 Other specified disorders of bone density and structure, other site: Secondary | ICD-10-CM | POA: Diagnosis not present

## 2022-09-25 DIAGNOSIS — K219 Gastro-esophageal reflux disease without esophagitis: Secondary | ICD-10-CM

## 2022-09-25 DIAGNOSIS — I152 Hypertension secondary to endocrine disorders: Secondary | ICD-10-CM | POA: Diagnosis not present

## 2022-09-25 DIAGNOSIS — E6609 Other obesity due to excess calories: Secondary | ICD-10-CM

## 2022-09-25 DIAGNOSIS — Z6832 Body mass index (BMI) 32.0-32.9, adult: Secondary | ICD-10-CM | POA: Diagnosis not present

## 2022-09-25 DIAGNOSIS — E782 Mixed hyperlipidemia: Secondary | ICD-10-CM

## 2022-09-25 DIAGNOSIS — E890 Postprocedural hypothyroidism: Secondary | ICD-10-CM

## 2022-09-25 DIAGNOSIS — R0789 Other chest pain: Secondary | ICD-10-CM

## 2022-09-25 DIAGNOSIS — E1159 Type 2 diabetes mellitus with other circulatory complications: Secondary | ICD-10-CM | POA: Diagnosis not present

## 2022-09-25 DIAGNOSIS — E1169 Type 2 diabetes mellitus with other specified complication: Secondary | ICD-10-CM | POA: Diagnosis not present

## 2022-09-25 NOTE — Progress Notes (Unsigned)
Subjective:  Patient ID: Amy Mccann, female    DOB: 1941/01/30  Age: 82 y.o. MRN: [id number]  Chief Complaint  Patient presents with   Medical Management of Chronic Issues   HPI Patient present today for follow up for diabetes, HTN, hyperlipidemia, and hypothyroidism.  Diabetes:  Complications: polyneuropathy Glucose checking: Once daily. Glucose logs: 125-191. Hypoglycemia: no  Most recent A1C: 6.4 Current medications: none Last Eye Exam: 08/19/2022 Foot checks: Daily  Hyperlipidemia: Current medications: Patient is currently taking Rosuvastatin 5 mg take 1 tablet daily.  Hypertension associated with diabetes: Current medications: Patient is currently taking losartan 25 mg take 1 tablet daily.  Hypothyroidism: Patient is currently taking Levothyroxine 112 mcg take 1 tablet daily     09/25/2022    9:52 AM 03/05/2022   10:19 AM 03/05/2022    9:20 AM 02/24/2022    9:23 AM 08/29/2021    8:48 AM  Depression screen PHQ 2/9  Decreased Interest 0 0 1 0 0  Down, Depressed, Hopeless 0 0 0 0 0  PHQ - 2 Score 0 0 1 0 0  Altered sleeping  0     Tired, decreased energy  0     Change in appetite  0     Feeling bad or failure about yourself   0     Trouble concentrating  0     Moving slowly or fidgety/restless  0     Suicidal thoughts  0     PHQ-9 Score  0           09/25/2022    9:52 AM  Fall Risk   Falls in the past year? 0  Number falls in past yr: 0  Injury with Fall? 0  Risk for fall due to : Impaired mobility  Follow up Falls evaluation completed;Falls prevention discussed    Patient Care Team: Blane Ohara, MD as PCP - General (Family Medicine) Arman Bogus, MD as Consulting Physician (Neurosurgery) Misenheimer, Marcial Pacas, MD as Consulting Physician (Unknown Physician Specialty) Dellia Beckwith, MD as Consulting Physician (Oncology) Rogelio Seen, OD (Optometry)   Review of Systems  Constitutional:  Positive for fatigue.  HENT:  Negative for  congestion, ear pain and sore throat.   Respiratory:  Negative for cough and shortness of breath.   Cardiovascular:  Positive for chest pain (sharp under arm. lasted 5 minutes. little sob. no gerd.).  Gastrointestinal:  Negative for abdominal pain, constipation, diarrhea, nausea and vomiting.  Genitourinary:  Negative for dysuria, frequency and urgency.  Musculoskeletal:  Positive for arthralgias and back pain. Negative for myalgias.  Neurological:  Positive for headaches. Negative for dizziness.  Psychiatric/Behavioral:  Negative for agitation and sleep disturbance. The patient is not nervous/anxious.     Current Outpatient Medications on File Prior to Visit  Medication Sig Dispense Refill   acetaminophen (TYLENOL) 500 MG tablet Take 500 mg by mouth every 6 (six) hours as needed.     Multiple Vitamin (MULTIVITAMIN) tablet Take 1 tablet by mouth daily.     Omega-3 Fatty Acids (FISH OIL) 1000 MG CAPS Take 1,000 mg by mouth daily.     Accu-Chek Softclix Lancets lancets USE AS DIRECTED 100 each 5   esomeprazole (NEXIUM) 40 MG capsule TAKE 1 CAPSULE(40 MG) BY MOUTH TWICE DAILY BEFORE A MEAL 180 capsule 3   glucose blood (ACCU-CHEK AVIVA PLUS) test strip USE TO CHECK FASTING BLOOD SUGAR ONCE DAILY AS DIRECTED 100 strip 3   levothyroxine (SYNTHROID) 112 MCG tablet TAKE 1  TABLET(112 MCG) BY MOUTH DAILY BEFORE BREAKFAST 90 tablet 1   losartan (COZAAR) 25 MG tablet TAKE 1 TABLET BY MOUTH DAILY 90 tablet 0   Multiple Vitamin (MULTIVITAMIN PO) Take by mouth every other day.     ondansetron (ZOFRAN-ODT) 8 MG disintegrating tablet DISSOLVE 1/2 TABLET(4 MG) ON THE TONGUE EVERY 8 HOURS AS NEEDED FOR NAUSEA OR VOMITING 30 tablet 1   psyllium (METAMUCIL) 58.6 % packet Take by mouth.     rosuvastatin (CRESTOR) 5 MG tablet TAKE 1 TABLET(5 MG) BY MOUTH DAILY 90 tablet 1   No current facility-administered medications on file prior to visit.   Past Medical History:  Diagnosis Date   Age-related osteoporosis  without current pathological fracture 07/03/2019   Cancer [facility]) 1990   Left Breast Cancer    Cancer (HCC) 2017   Thyroid Cancer   Cervicalgia 07/03/2019   Diabetes mellitus without complication (HCC)    Generalized anxiety disorder    GERD (gastroesophageal reflux disease)    Hypertension    Low back pain with sciatica 07/03/2019   Obesity due to excess calories 07/03/2019   S/P repair of paraesophageal hernia 10/20/2013   S/P TKR (total knee replacement) 07/25/2012   Past Surgical History:  Procedure Laterality Date   24 HOUR PH STUDY N/A 08/07/2013   Procedure: 24 HOUR PH STUDY;  Surgeon: Barrie Folk, MD;  Location: [facility];  Service: Endoscopy;  Laterality: N/A;   APPENDECTOMY  03/17/2016   ESOPHAGEAL MANOMETRY N/A 08/07/2013   Procedure: ESOPHAGEAL MANOMETRY (EM);  Surgeon: Barrie Folk, MD;  Location: [facility];  Service: Endoscopy;  Laterality: N/A;   HERNIA SURGERY  2015   HIATAL HERNIA SURGERY (FUNDOPLICATION.)   JOINT REPLACEMENT Right    right knee   MASTECTOMY Left 1990   THYROIDECTOMY Bilateral 08/2015    Family History  Problem Relation Age of Onset   Leukemia Brother    Social History   Socioeconomic History   Marital status: Divorced    Spouse name: Not on file   Number of children: 0   Years of education: Not on file   Highest education level: Not on file  Occupational History   Occupation: Retired from [employer]  Tobacco Use   Smoking status: Never    Passive exposure: Never   Smokeless tobacco: Never  Vaping Use   Vaping status: Never Used  Substance and Sexual Activity   Alcohol use: Never   Drug use: Never   Sexual activity: Not Currently  Other Topics Concern   Not on file  Social History Narrative   One sister lives near by, the other sister lives in [place]    Social Determinants of Health   Financial Resource Strain: Low Risk  (02/24/2022)   Overall Financial Resource Strain (CARDIA)    Difficulty of Paying Living Expenses: Not hard at  all  Food Insecurity: No Food Insecurity (02/24/2022)   Hunger Vital Sign    Worried About Running Out of Food in the Last Year: Never true    Ran Out of Food in the Last Year: Never true  Transportation Needs: No Transportation Needs (02/24/2022)   PRAPARE - Administrator, Civil Service (Medical): No    Lack of Transportation (Non-Medical): No  Physical Activity: Insufficiently Active (02/24/2022)   Exercise Vital Sign    Days of Exercise per Week: 7 days    Minutes of Exercise per Session: 20 min  Stress: No Stress Concern Present (02/24/2022)   [employer]  of Occupational Health - Occupational Stress Questionnaire    Feeling of Stress : Not at all  Social Connections: Moderately Integrated (02/24/2022)   Social Connection and Isolation Panel [NHANES]    Frequency of Communication with Friends and Family: More than three times a week    Frequency of Social Gatherings with Friends and Family: More than three times a week    Attends Religious Services: More than 4 times per year    Active Member of [employer] or Organizations: No    Attends Engineer, structural: More than 4 times per year    Marital Status: Divorced    Objective:  BP 128/64   Pulse 78   Temp (!) 96.5 F (35.8 C)   Resp 18   Ht 4\' 11"  (1.499 m)   Wt 158 lb (71.7 kg)   BMI 31.91 kg/m      09/25/2022    9:44 AM 08/06/2022    9:16 AM 07/10/2022    8:34 AM  BP/Weight  Systolic BP 128 118 110  Diastolic BP 64 68 64  Wt. (Lbs) 158 159 159.6  BMI 31.91 kg/m2 32.11 kg/m2 32.24 kg/m2    Physical Exam Vitals reviewed.  Constitutional:      Appearance: Normal appearance. She is obese.  Neck:     Vascular: No carotid bruit.  Cardiovascular:     Rate and Rhythm: Normal rate and regular rhythm.     Heart sounds: Normal heart sounds.  Pulmonary:     Effort: Pulmonary effort is normal. No respiratory distress.     Breath sounds: Normal breath sounds.  Abdominal:     General: Abdomen is flat.  Bowel sounds are normal.     Palpations: Abdomen is soft.     Tenderness: There is no abdominal tenderness.  Neurological:     Mental Status: She is alert and oriented to person, place, and time.  Psychiatric:        Mood and Affect: Mood normal.        Behavior: Behavior normal.     Diabetic Foot Exam - Simple   Simple Foot Form Visual Inspection See comments: Yes Sensation Testing Intact to touch and monofilament testing bilaterally: Yes Pulse Check Posterior Tibialis and Dorsalis pulse intact bilaterally: Yes Comments Bunion right foot. Corn 4th toe right foot.        Lab Results  Component Value Date   WBC 7.0 08/06/2022   HGB 12.5 08/06/2022   HCT 36.4 08/06/2022   PLT 253 08/06/2022   GLUCOSE 103 (H) 08/06/2022   CHOL 164 06/05/2022   TRIG 156 (H) 06/05/2022   HDL 68 06/05/2022   LDLCALC 70 06/05/2022   ALT 14 08/06/2022   AST 16 08/06/2022   NA 133 (L) 08/06/2022   K 4.3 08/06/2022   CL 97 08/06/2022   CREATININE 0.96 08/06/2022   BUN 11 08/06/2022   CO2 20 08/06/2022   TSH 1.050 06/05/2022   INR 0.99 01/21/2010   HGBA1C 6.6 (H) 06/05/2022   MICROALBUR 30 04/05/2019      Assessment & Plan:    Other chest pain Assessment & Plan: EKG normal. Likely musculoskeletal.   Hypertension complicating diabetes (HCC) Assessment & Plan: Hypertension and diabetes well controlled.  Recommend check sugars fasting daily. Recommend check feet daily. Recommend annual eye exams. Medicines: losartan 25 mg daily.  Continue to work on eating a healthy diet and exercise.  Labs drawn today.      Orders: -  CMP14+EGFR -     CBC with Differential/Platelet -     Hemoglobin A1c -     EKG 12-Lead  GERD without esophagitis Assessment & Plan: The current medical regimen is effective;  continue present plan and medications. nexium 40 mg twice daily   Postoperative hypothyroidism Assessment & Plan: Previously well controlled Continue Synthroid at current  dose  Recheck TSH and adjust Synthroid as indicated     Osteopenia of lumbar spine Assessment & Plan: Continue calcium with vitamin D.    Mixed hyperlipidemia Assessment & Plan: Well controlled.  No changes to medicines. Continue Rosuvastatin 5 mg take 1 tablet daily. Continue to work on eating a healthy diet and exercise.  Labs drawn today.  Orders: -     Lipid panel  Class 1 obesity due to excess calories with serious comorbidity and body mass index (BMI) of 32.0 to 32.9 in adult Assessment & Plan: continue to work on eating healthy diet and exercise.        No orders of the defined types were placed in this encounter.   Orders Placed This Encounter  Procedures   CMP14+EGFR   CBC with Differential/Platelet   Hemoglobin A1c   Lipid panel   EKG 12-Lead     Follow-up: Return in about 4 months (around 01/25/2023).   I,Jacqua L Marsh,acting as a scribe for Blane Ohara, MD.,have documented all relevant documentation on the behalf of Blane Ohara, MD,as directed by  Blane Ohara, MD while in the presence of Blane Ohara, MD.   Clayborn Bigness I Leal-Borjas,acting as a scribe for Blane Ohara, MD.,have documented all relevant documentation on the behalf of Blane Ohara, MD,as directed by  Blane Ohara, MD while in the presence of Blane Ohara, MD.    An After Visit Summary was printed and given to the patient.  Blane Ohara, MD [facility] [phone]

## 2022-09-25 NOTE — Assessment & Plan Note (Signed)
Well controlled.  No changes to medicines. Continue Rosuvastatin 5 mg take 1 tablet daily. Continue to work on eating a healthy diet and exercise.  Labs drawn today.

## 2022-09-25 NOTE — Assessment & Plan Note (Signed)
Hypertension and diabetes well controlled.  Recommend check sugars fasting daily. Recommend check feet daily. Recommend annual eye exams. Medicines: losartan 25 mg daily.  Continue to work on eating a healthy diet and exercise.  Labs drawn today.

## 2022-09-25 NOTE — Assessment & Plan Note (Signed)
continue to work on eating healthy diet and exercise.

## 2022-09-27 DIAGNOSIS — R0789 Other chest pain: Secondary | ICD-10-CM | POA: Insufficient documentation

## 2022-09-27 NOTE — Assessment & Plan Note (Signed)
The current medical regimen is effective;  continue present plan and medications. nexium 40 mg twice daily

## 2022-09-27 NOTE — Assessment & Plan Note (Signed)
EKG normal. Likely musculoskeletal.

## 2022-09-27 NOTE — Assessment & Plan Note (Signed)
Continue calcium with vitamin D.  

## 2022-09-27 NOTE — Assessment & Plan Note (Signed)
Previously well controlled Continue Synthroid at current dose  Recheck TSH and adjust Synthroid as indicated   

## 2022-09-28 ENCOUNTER — Encounter: Payer: Self-pay | Admitting: Family Medicine

## 2022-09-28 LAB — HEMOGLOBIN A1C: A1c: 6.3

## 2022-10-08 DIAGNOSIS — Z1231 Encounter for screening mammogram for malignant neoplasm of breast: Secondary | ICD-10-CM | POA: Diagnosis not present

## 2022-10-08 LAB — HM MAMMOGRAPHY

## 2022-10-13 ENCOUNTER — Encounter: Payer: Self-pay | Admitting: Family Medicine

## 2022-10-15 DIAGNOSIS — L84 Corns and callosities: Secondary | ICD-10-CM | POA: Diagnosis not present

## 2022-10-20 ENCOUNTER — Telehealth: Payer: Self-pay

## 2022-10-20 NOTE — Telephone Encounter (Signed)
Nolan called in follow-up about her mammogram and her EKG that she had in August.  I called her back and left a message on her machine with results.  Patient to call back with questions if needed.

## 2022-11-13 ENCOUNTER — Ambulatory Visit: Payer: Medicare HMO | Admitting: Oncology

## 2022-11-16 ENCOUNTER — Telehealth: Payer: Self-pay

## 2022-11-16 ENCOUNTER — Ambulatory Visit: Payer: Medicare HMO | Admitting: Oncology

## 2022-11-16 NOTE — Telephone Encounter (Signed)
Amy Mccann called with complaints of a headache. She also is complaining of her eyes feeling dry.  She has scheduled herself to be seen for an eye exam on Tuesday.  She is going to take Tylenol for the headache and follow up in the office if symptoms do not improve.

## 2022-11-18 DIAGNOSIS — H353131 Nonexudative age-related macular degeneration, bilateral, early dry stage: Secondary | ICD-10-CM | POA: Diagnosis not present

## 2022-11-18 DIAGNOSIS — E119 Type 2 diabetes mellitus without complications: Secondary | ICD-10-CM | POA: Diagnosis not present

## 2022-11-19 ENCOUNTER — Encounter: Payer: Self-pay | Admitting: Physician Assistant

## 2022-11-19 ENCOUNTER — Ambulatory Visit: Payer: Medicare HMO | Admitting: Physician Assistant

## 2022-11-19 VITALS — BP 120/60 | HR 88 | Temp 97.3°F | Resp 14 | Ht 59.0 in | Wt 156.0 lb

## 2022-11-19 DIAGNOSIS — G43C0 Periodic headache syndromes in child or adult, not intractable: Secondary | ICD-10-CM

## 2022-11-19 DIAGNOSIS — G43909 Migraine, unspecified, not intractable, without status migrainosus: Secondary | ICD-10-CM | POA: Insufficient documentation

## 2022-11-19 NOTE — Progress Notes (Signed)
Subjective:  Patient ID: Amy Mccann, female    DOB: 04-10-1940  Age: 82 y.o. MRN: [id number]  Chief Complaint  Patient presents with   Headache    HPI   Patient mentioned headache on the occipital area since 7 days ago. She mentioned that Sunday and Monday was worst. She is worried to have a stroke because her sister passed away for a stroke. She mentioned blurred vision, numbness on her hands and weakness  on both. However, she has macula degeneration and arthritis, so she is not sure if the symptoms is these diseases.  She denies chest pain, SOB, speech difficulty or any swallowing problems. Denies the vision changes being any worse. States that she does not currently have a headache and that it got better the next day. States that she recently saw a new eye doctor and said she also had excessive dry eyes. He told her to get some eye drops to help with the dry eyes. States that her headaches started coming on after she started having the try eye and blurry vision.      09/25/2022    9:52 AM 03/05/2022   10:19 AM 03/05/2022    9:20 AM 02/24/2022    9:23 AM 08/29/2021    8:48 AM  Depression screen PHQ 2/9  Decreased Interest 0 0 1 0 0  Down, Depressed, Hopeless 0 0 0 0 0  PHQ - 2 Score 0 0 1 0 0  Altered sleeping  0     Tired, decreased energy  0     Change in appetite  0     Feeling bad or failure about yourself   0     Trouble concentrating  0     Moving slowly or fidgety/restless  0     Suicidal thoughts  0     PHQ-9 Score  0           09/25/2022    9:52 AM  Fall Risk   Falls in the past year? 0  Number falls in past yr: 0  Injury with Fall? 0  Risk for fall due to : Impaired mobility  Follow up Falls evaluation completed;Falls prevention discussed    Patient Care Team: Blane Ohara, MD as PCP - General (Family Medicine) Arman Bogus, MD as Consulting Physician (Neurosurgery) Misenheimer, Marcial Pacas, MD as Consulting Physician (Unknown Physician  Specialty) Dellia Beckwith, MD as Consulting Physician (Oncology) Rogelio Seen, OD (Optometry)   Review of Systems  Constitutional:  Negative for chills, fatigue and fever.  HENT:  Negative for congestion, ear pain and sore throat.   Eyes:  Positive for visual disturbance.  Respiratory:  Positive for cough. Negative for shortness of breath.   Cardiovascular:  Negative for chest pain and palpitations.  Gastrointestinal:  Negative for abdominal pain, constipation, diarrhea, nausea and vomiting.  Endocrine: Negative for polydipsia, polyphagia and polyuria.  Genitourinary:  Negative for difficulty urinating and dysuria.  Musculoskeletal:  Negative for arthralgias, back pain and myalgias.  Skin:  Negative for rash.  Neurological:  Positive for dizziness, facial asymmetry, weakness, numbness and headaches. Negative for speech difficulty.  Psychiatric/Behavioral:  Negative for dysphoric mood. The patient is not nervous/anxious.     Current Outpatient Medications on File Prior to Visit  Medication Sig Dispense Refill   Accu-Chek Softclix Lancets lancets USE AS DIRECTED 100 each 5   acetaminophen (TYLENOL) 500 MG tablet Take 500 mg by mouth every 6 (six) hours as needed.  esomeprazole (NEXIUM) 40 MG capsule TAKE 1 CAPSULE(40 MG) BY MOUTH TWICE DAILY BEFORE A MEAL 180 capsule 3   glucose blood (ACCU-CHEK AVIVA PLUS) test strip USE TO CHECK FASTING BLOOD SUGAR ONCE DAILY AS DIRECTED 100 strip 3   levothyroxine (SYNTHROID) 112 MCG tablet TAKE 1 TABLET(112 MCG) BY MOUTH DAILY BEFORE BREAKFAST 90 tablet 1   losartan (COZAAR) 25 MG tablet TAKE 1 TABLET BY MOUTH DAILY 90 tablet 0   Multiple Vitamin (MULTIVITAMIN PO) Take by mouth every other day.     Multiple Vitamin (MULTIVITAMIN) tablet Take 1 tablet by mouth daily.     Omega-3 Fatty Acids (FISH OIL) 1000 MG CAPS Take 1,000 mg by mouth daily.     ondansetron (ZOFRAN-ODT) 8 MG disintegrating tablet DISSOLVE 1/2 TABLET(4 MG) ON THE TONGUE  EVERY 8 HOURS AS NEEDED FOR NAUSEA OR VOMITING 30 tablet 1   psyllium (METAMUCIL) 58.6 % packet Take by mouth.     rosuvastatin (CRESTOR) 5 MG tablet TAKE 1 TABLET(5 MG) BY MOUTH DAILY 90 tablet 1   No current facility-administered medications on file prior to visit.   Past Medical History:  Diagnosis Date   Age-related osteoporosis without current pathological fracture 07/03/2019   Cancer [facility]) 1990   Left Breast Cancer    Cancer (HCC) 2017   Thyroid Cancer   Cervicalgia 07/03/2019   Diabetes mellitus without complication (HCC)    Generalized anxiety disorder    GERD (gastroesophageal reflux disease)    Hypertension    Low back pain with sciatica 07/03/2019   Obesity due to excess calories 07/03/2019   S/P repair of paraesophageal hernia 10/20/2013   S/P TKR (total knee replacement) 07/25/2012   Past Surgical History:  Procedure Laterality Date   24 HOUR PH STUDY N/A 08/07/2013   Procedure: 24 HOUR PH STUDY;  Surgeon: Barrie Folk, MD;  Location: [facility];  Service: Endoscopy;  Laterality: N/A;   APPENDECTOMY  03/17/2016   ESOPHAGEAL MANOMETRY N/A 08/07/2013   Procedure: ESOPHAGEAL MANOMETRY (EM);  Surgeon: Barrie Folk, MD;  Location: [facility];  Service: Endoscopy;  Laterality: N/A;   HERNIA SURGERY  2015   HIATAL HERNIA SURGERY (FUNDOPLICATION.)   JOINT REPLACEMENT Right    right knee   MASTECTOMY Left 1990   THYROIDECTOMY Bilateral 08/2015    Family History  Problem Relation Age of Onset   Leukemia Brother    Social History   Socioeconomic History   Marital status: Divorced    Spouse name: Not on file   Number of children: 0   Years of education: Not on file   Highest education level: Not on file  Occupational History   Occupation: Retired from [employer]  Tobacco Use   Smoking status: Never    Passive exposure: Never   Smokeless tobacco: Never  Vaping Use   Vaping status: Never Used  Substance and Sexual Activity   Alcohol use: Never   Drug use: Never    Sexual activity: Not Currently  Other Topics Concern   Not on file  Social History Narrative   One sister lives near by, the other sister lives in [place]    Social Determinants of Health   Financial Resource Strain: Low Risk  (02/24/2022)   Overall Financial Resource Strain (CARDIA)    Difficulty of Paying Living Expenses: Not hard at all  Food Insecurity: No Food Insecurity (02/24/2022)   Hunger Vital Sign    Worried About Running Out of Food in the Last Year: Never true  Ran Out of Food in the Last Year: Never true  Transportation Needs: No Transportation Needs (02/24/2022)   PRAPARE - Administrator, Civil Service (Medical): No    Lack of Transportation (Non-Medical): No  Physical Activity: Insufficiently Active (02/24/2022)   Exercise Vital Sign    Days of Exercise per Week: 7 days    Minutes of Exercise per Session: 20 min  Stress: No Stress Concern Present (02/24/2022)   [employer] of Occupational Health - Occupational Stress Questionnaire    Feeling of Stress : Not at all  Social Connections: Moderately Integrated (02/24/2022)   Social Connection and Isolation Panel [NHANES]    Frequency of Communication with Friends and Family: More than three times a week    Frequency of Social Gatherings with Friends and Family: More than three times a week    Attends Religious Services: More than 4 times per year    Active Member of [employer] or Organizations: No    Attends Engineer, structural: More than 4 times per year    Marital Status: Divorced    Objective:  BP 120/60   Pulse 88   Temp (!) 97.3 F (36.3 C)   Resp 14   Ht 4\' 11"  (1.499 m)   Wt 156 lb (70.8 kg)   SpO2 97%   BMI 31.51 kg/m      11/19/2022   10:03 AM 09/25/2022    9:44 AM 08/06/2022    9:16 AM  BP/Weight  Systolic BP 120 128 118  Diastolic BP 60 64 68  Wt. (Lbs) 156 158 159  BMI 31.51 kg/m2 31.91 kg/m2 32.11 kg/m2    Physical Exam Vitals reviewed.  Constitutional:       Appearance: Normal appearance.  Eyes:     General: No visual field deficit or scleral icterus.    Extraocular Movements: Extraocular movements intact.     Right eye: No nystagmus.     Left eye: No nystagmus.     Pupils: Pupils are equal, round, and reactive to light.  Neck:     Vascular: No carotid bruit.  Cardiovascular:     Rate and Rhythm: Normal rate and regular rhythm.     Heart sounds: Normal heart sounds.  Pulmonary:     Effort: Pulmonary effort is normal.     Breath sounds: Normal breath sounds.  Abdominal:     General: Bowel sounds are normal.     Palpations: Abdomen is soft.     Tenderness: There is no abdominal tenderness.  Neurological:     Mental Status: She is alert and oriented to person, place, and time.     Cranial Nerves: No cranial nerve deficit or facial asymmetry.     Sensory: No sensory deficit.     Motor: No weakness.     Coordination: Coordination normal.     Gait: Gait normal.  Psychiatric:        Mood and Affect: Mood normal.        Behavior: Behavior normal.     Diabetic Foot Exam - Simple   No data filed      Lab Results  Component Value Date   WBC 7.0 08/06/2022   HGB 12.5 08/06/2022   HCT 36.4 08/06/2022   PLT 253 08/06/2022   GLUCOSE 103 (H) 08/06/2022   CHOL 164 06/05/2022   TRIG 156 (H) 06/05/2022   HDL 68 06/05/2022   LDLCALC 70 06/05/2022   ALT 14 08/06/2022   AST 16 08/06/2022  NA 133 (L) 08/06/2022   K 4.3 08/06/2022   CL 97 08/06/2022   CREATININE 0.96 08/06/2022   BUN 11 08/06/2022   CO2 20 08/06/2022   TSH 1.050 06/05/2022   INR 0.99 01/21/2010   HGBA1C 6.6 (H) 06/05/2022   MICROALBUR 30 04/05/2019   Total time spent on today's visit was greater than 30 minutes, including both face-to-face time and nonface-to-face time personally spent on review of chart (labs and imaging), discussing labs and goals, discussing further work-up, treatment options, referrals to specialist if needed, reviewing outside records of  pertinent, answering patient's questions, and coordinating care.    Assessment & Plan:    Periodic headache syndrome, not intractable Assessment & Plan: Will try excedrin during next headache Discussed signs and symptoms of strokes Will call ambulance if she thinks she is having a stroke If headaches increase in frequency and duration we will do a CT      No orders of the defined types were placed in this encounter.   No orders of the defined types were placed in this encounter.    Follow-up: No follow-ups on file.   I,Marla I Leal-Borjas,acting as a scribe for [employer], PA.,have documented all relevant documentation on the behalf of Langley Gauss, PA,as directed by  Langley Gauss, PA while in the presence of Langley Gauss, [place].   An After Visit Summary was printed and given to the patient.  Langley Gauss, [place] [facility] [phone]

## 2022-11-19 NOTE — Assessment & Plan Note (Signed)
Will try excedrin during next headache Discussed signs and symptoms of strokes Will call ambulance if she thinks she is having a stroke If headaches increase in frequency and duration we will do a CT

## 2022-11-26 NOTE — Progress Notes (Signed)
[facility]  [address] [place],  [place]  [address] [phone]   Clinic Day: 11/27/22   Referring physician: Blane Ohara, MD   ASSESSMENT & PLAN:  History of left breast cancer Most recent mammogram benign. No evidence of disease recurrence. She will repeat mammogram next July.    Age-related osteopenia without current pathological fracture Bone density exam reveals continued osteopenia. PCP is managing this for her. She will be due for repeat imaging this year.    Papillary thyroid cancer in July 2017 She had a total thyroidectomy and is now hypothyroid. Last TSH was 1.77  in April of 2023. This is being managed by her endocrinologist.    Plan: She had a screening unilateral right mammogram done on 10/08/2022 that was clear. She asked whether she still has to get mammograms and I encouraged her to continue them since she has a good performance status for her age. I recommended that she can get her mammogram every other year if she wishes. After our discussion, she would like to continue yearly mammograms and this will be scheduled by Dr. Sedalia Muta. Her PCP does routine labs on her. Her CBC in August was normal. I will see her back in 1 year for reevaluation. I discussed the assessment and treatment plan with the patient.  The patient was provided an opportunity to ask questions and all were answered.  The patient agreed with the plan and demonstrated an understanding of the instructions.  The patient was advised to call back if she has issues concerning breast cancer.   I provided 10 minutes of face-to-face time during this this encounter and > 50% was spent counseling as documented under my assessment and plan.    Dellia Beckwith, MD [facility] [address] [place] [address] Dept: [phone] Dept Fax: [phone]     Dellia Beckwith, MD   10/27/20236:08 PM   CHIEF  COMPLAINT:  CC: history of left breast cancer   Current Treatment:  surveillance    HISTORY OF PRESENT ILLNESS:       Oncology History    No history exists.    Amy Mccann is an 82 year old woman with a history of stage III hormone receptor positive left breast cancer diagnosed in  October 1990. She was treated with left modified radical mastectomy and axillary dissection with negative nodes. She received adjuvant chemotherapy with 5 fluorouracil, doxorubicin and cyclophosphamide, followed by adjuvant radiation and 5 years of tamoxifen. She has never had evidence of recurrence. She was found to have papillary thyroid cancer in July 2017 treated with total thyroidectomy and postoperative I 131. 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 She also had papillary microcarcinoma, follicular variant, which was circumscribed and only 1 mm in size. There was also a possible additional focus of papillary carcinoma associated with an intratympanic psammoma body. She ois seen by Dr. Katrinka Blazing, her endocrinologist in [facility] for follow up of thyroid cancer.    INTERVAL HISTORY:  Amy Mccann is seen here today with a remote history of left breast cancer. Patient states that she feels well but complains of arthralgias of her hands. She had a screening unilateral right mammogram done on 10/08/2022 that was clear. She asked whether she still has to get mammograms and I encouraged her to continue them since she has a good performance status for her age. I recommended  that she can get her mammogram every other year if she wishes. After our discussion, she would like to continue yearly mammograms and this will be scheduled by Dr. Sedalia Muta. Her PCP does routine labs on her. Her CBC in August was normal. I will see her back in 1 year for reevaluation.   She denies signs of infection such as sore throat, sinus drainage, cough, or urinary symptoms.  She  denies fevers or recurrent chills. She denies pain. She denies nausea, vomiting, chest pain, dyspnea or cough. Her appetite is good and her weight has increased 2 pounds over last 2 months .  REVIEW OF SYSTEMS:  Review of Systems  Constitutional: Negative.  Negative for appetite change, chills, diaphoresis, fatigue, fever and unexpected weight change.  HENT:  Negative.  Negative for hearing loss, lump/mass, mouth sores, nosebleeds, sore throat, tinnitus, trouble swallowing and voice change.   Eyes: Negative.  Negative for eye problems and icterus.  Respiratory: Negative.  Negative for chest tightness, cough, hemoptysis, shortness of breath and wheezing.   Cardiovascular: Negative.  Negative for chest pain, leg swelling and palpitations.  Gastrointestinal: Negative.  Negative for abdominal distention, abdominal pain, blood in stool, constipation, diarrhea, nausea, rectal pain and vomiting.  Endocrine: Negative.   Genitourinary: Negative.  Negative for bladder incontinence, difficulty urinating, dyspareunia, dysuria, frequency, hematuria, menstrual problem, nocturia, pelvic pain, vaginal bleeding and vaginal discharge.   Musculoskeletal:  Positive for arthralgias (fingers). Negative for back pain, flank pain, gait problem, myalgias, neck pain and neck stiffness.  Skin: Negative.  Negative for itching, rash and wound.  Neurological:  Negative for dizziness, extremity weakness, gait problem, headaches, light-headedness, numbness, seizures and speech difficulty.  Hematological: Negative.  Negative for adenopathy. Does not bruise/bleed easily.  Psychiatric/Behavioral: Negative.  Negative for confusion, decreased concentration, depression, sleep disturbance and suicidal ideas. The patient is not nervous/anxious.    VITALS:  Blood pressure 135/70, pulse 75, temperature 97.8 F (36.6 C), temperature source Oral, resp. rate 18, height 4' 10.5" (1.486 m), weight 158 lb 4.8 oz (71.8 kg), SpO2 98 %.     Wt  Readings from Last 3 Encounters:  11/13/21 158 lb 4.8 oz (71.8 kg)  08/29/21 159 lb (72.1 kg)  05/15/21 164 lb 3.2 oz (74.5 kg)    Body mass index is 32.52 kg/m.   Performance status (ECOG): 0 - Asymptomatic   PHYSICAL EXAM:  Physical Exam Vitals and nursing note reviewed.  Constitutional:      General: She is not in acute distress.    Appearance: Normal appearance. She is normal weight. She is not ill-appearing, toxic-appearing or diaphoretic.  HENT:     Head: Normocephalic and atraumatic.     Right Ear: Tympanic membrane, ear canal and external ear normal. There is no impacted cerumen.     Left Ear: Tympanic membrane, ear canal and external ear normal. There is no impacted cerumen.     Nose: Nose normal. No congestion or rhinorrhea.     Mouth/Throat:     Mouth: Mucous membranes are moist.     Pharynx: Oropharynx is clear. No oropharyngeal exudate or posterior oropharyngeal erythema.  Eyes:     General: No scleral icterus.       Right eye: No discharge.        Left eye: No discharge.     Extraocular Movements: Extraocular movements intact.     Conjunctiva/sclera: Conjunctivae normal.     Pupils: Pupils are equal, round, and reactive to light.  Neck:  Vascular: No carotid bruit.  Cardiovascular:     Rate and Rhythm: Normal rate and regular rhythm.     Pulses: Normal pulses.     Heart sounds: Normal heart sounds. No murmur heard.    No friction rub. No gallop.  Pulmonary:     Effort: Pulmonary effort is normal. No respiratory distress.     Breath sounds: Normal breath sounds. No stridor. No wheezing, rhonchi or rales.  Chest:     Chest wall: No tenderness.     Comments: Left mastectomy is negative. Right breast is without masses.  Abdominal:     General: Bowel sounds are normal. There is no distension.     Palpations: Abdomen is soft. There is no hepatomegaly, splenomegaly or mass.     Tenderness: There is no abdominal tenderness. There is no right CVA tenderness,  left CVA tenderness, guarding or rebound.     Hernia: No hernia is present.  Musculoskeletal:        General: No swelling, tenderness, deformity or signs of injury. Normal range of motion.     Cervical back: Normal range of motion and neck supple. No rigidity or tenderness.     Right lower leg: No edema.     Left lower leg: No edema.  Lymphadenopathy:     Cervical: No cervical adenopathy.     Right cervical: No superficial, deep or posterior cervical adenopathy.    Left cervical: No superficial, deep or posterior cervical adenopathy.     Upper Body:     Right upper body: No supraclavicular, axillary or pectoral adenopathy.     Left upper body: No supraclavicular, axillary or pectoral adenopathy.  Skin:    General: Skin is warm and dry.     Coloration: Skin is not jaundiced or pale.     Findings: No bruising, erythema, lesion or rash.  Neurological:     General: No focal deficit present.     Mental Status: She is alert and oriented to person, place, and time. Mental status is at baseline.     Cranial Nerves: No cranial nerve deficit.     Sensory: No sensory deficit.     Motor: No weakness.     Coordination: Coordination normal.     Gait: Gait normal.     Deep Tendon Reflexes: Reflexes normal.  Psychiatric:        Mood and Affect: Mood normal.        Behavior: Behavior normal.        Thought Content: Thought content normal.        Judgment: Judgment normal.    LABS:      STUDIES:    HISTORY:   Allergies  Allergen Reactions   Ergocalciferol Other (See Comments)    somnolence   Codeine Itching and Rash   Penicillins Rash   Outpatient Medications Prior to Visit  Medication Sig Dispense Refill   Accu-Chek Softclix Lancets lancets USE AS DIRECTED 100 each 5   acetaminophen (TYLENOL) 500 MG tablet Take 500 mg by mouth every 6 (six) hours as needed.     esomeprazole (NEXIUM) 40 MG capsule TAKE 1 CAPSULE(40 MG) BY MOUTH TWICE DAILY BEFORE A MEAL 180 capsule 3   glucose  blood (ACCU-CHEK AVIVA PLUS) test strip USE TO CHECK FASTING BLOOD SUGAR ONCE DAILY AS DIRECTED 100 strip 3   levothyroxine (SYNTHROID) 112 MCG tablet TAKE 1 TABLET(112 MCG) BY MOUTH DAILY BEFORE BREAKFAST 90 tablet 1   losartan (COZAAR) 25 MG tablet TAKE 1  TABLET BY MOUTH DAILY 90 tablet 0   Multiple Vitamin (MULTIVITAMIN PO) Take by mouth every other day.     Multiple Vitamin (MULTIVITAMIN) tablet Take 1 tablet by mouth daily.     Omega-3 Fatty Acids (FISH OIL) 1000 MG CAPS Take 1,000 mg by mouth daily.     ondansetron (ZOFRAN-ODT) 8 MG disintegrating tablet DISSOLVE 1/2 TABLET(4 MG) ON THE TONGUE EVERY 8 HOURS AS NEEDED FOR NAUSEA OR VOMITING 30 tablet 1   psyllium (METAMUCIL) 58.6 % packet Take by mouth.     rosuvastatin (CRESTOR) 5 MG tablet TAKE 1 TABLET(5 MG) BY MOUTH DAILY 90 tablet 1   No facility-administered medications prior to visit.    Past Medical History:  Diagnosis Date   Age-related osteoporosis without current pathological fracture 07/03/2019   Cancer [facility]) 1990   Left Breast Cancer    Cancer (HCC) 2017   Thyroid Cancer   Cervicalgia 07/03/2019   Diabetes mellitus without complication (HCC)    Generalized anxiety disorder    GERD (gastroesophageal reflux disease)    Hypertension    Low back pain with sciatica 07/03/2019   Obesity due to excess calories 07/03/2019   S/P repair of paraesophageal hernia 10/20/2013   S/P TKR (total knee replacement) 07/25/2012   Past Surgical History:  Procedure Laterality Date   24 HOUR PH STUDY N/A 08/07/2013   Procedure: 24 HOUR PH STUDY;  Surgeon: Barrie Folk, MD;  Location: [facility];  Service: Endoscopy;  Laterality: N/A;   APPENDECTOMY  03/17/2016   ESOPHAGEAL MANOMETRY N/A 08/07/2013   Procedure: ESOPHAGEAL MANOMETRY (EM);  Surgeon: Barrie Folk, MD;  Location: [facility];  Service: Endoscopy;  Laterality: N/A;   HERNIA SURGERY  2015   HIATAL HERNIA SURGERY (FUNDOPLICATION.)   JOINT REPLACEMENT Right    right knee    MASTECTOMY Left 1990   THYROIDECTOMY Bilateral 08/2015   Family History  Problem Relation Age of Onset   Leukemia Brother    Social History   Substance and Sexual Activity  Alcohol Use Never   Social History   Tobacco Use  Smoking Status Never   Passive exposure: Never  Smokeless Tobacco Never   Social History   Substance and Sexual Activity  Drug Use Never    I,Jasmine M Lassiter,acting as a scribe for Dellia Beckwith, MD.,have documented all relevant documentation on the behalf of Dellia Beckwith, MD,as directed by  Dellia Beckwith, MD while in the presence of Dellia Beckwith, MD.

## 2022-11-27 ENCOUNTER — Encounter: Payer: Self-pay | Admitting: Oncology

## 2022-11-27 ENCOUNTER — Inpatient Hospital Stay: Payer: Medicare HMO | Attending: Oncology | Admitting: Oncology

## 2022-11-27 VITALS — BP 143/68 | HR 85 | Temp 98.0°F | Resp 18 | Ht 59.0 in | Wt 156.6 lb

## 2022-11-27 DIAGNOSIS — Z9012 Acquired absence of left breast and nipple: Secondary | ICD-10-CM | POA: Diagnosis not present

## 2022-11-27 DIAGNOSIS — M2550 Pain in unspecified joint: Secondary | ICD-10-CM | POA: Insufficient documentation

## 2022-11-27 DIAGNOSIS — C73 Malignant neoplasm of thyroid gland: Secondary | ICD-10-CM | POA: Diagnosis not present

## 2022-11-27 DIAGNOSIS — E119 Type 2 diabetes mellitus without complications: Secondary | ICD-10-CM | POA: Diagnosis not present

## 2022-11-27 DIAGNOSIS — I10 Essential (primary) hypertension: Secondary | ICD-10-CM | POA: Insufficient documentation

## 2022-11-27 DIAGNOSIS — Z885 Allergy status to narcotic agent status: Secondary | ICD-10-CM | POA: Insufficient documentation

## 2022-11-27 DIAGNOSIS — E890 Postprocedural hypothyroidism: Secondary | ICD-10-CM | POA: Insufficient documentation

## 2022-11-27 DIAGNOSIS — Z79899 Other long term (current) drug therapy: Secondary | ICD-10-CM | POA: Diagnosis not present

## 2022-11-27 DIAGNOSIS — Z853 Personal history of malignant neoplasm of breast: Secondary | ICD-10-CM | POA: Insufficient documentation

## 2022-11-27 DIAGNOSIS — K219 Gastro-esophageal reflux disease without esophagitis: Secondary | ICD-10-CM | POA: Diagnosis not present

## 2022-11-27 DIAGNOSIS — Z806 Family history of leukemia: Secondary | ICD-10-CM | POA: Diagnosis not present

## 2022-11-27 DIAGNOSIS — Z9049 Acquired absence of other specified parts of digestive tract: Secondary | ICD-10-CM | POA: Diagnosis not present

## 2022-11-27 DIAGNOSIS — Z880 Allergy status to penicillin: Secondary | ICD-10-CM | POA: Diagnosis not present

## 2022-11-27 DIAGNOSIS — M8580 Other specified disorders of bone density and structure, unspecified site: Secondary | ICD-10-CM | POA: Diagnosis not present

## 2022-11-27 DIAGNOSIS — E039 Hypothyroidism, unspecified: Secondary | ICD-10-CM

## 2022-11-27 DIAGNOSIS — Z9089 Acquired absence of other organs: Secondary | ICD-10-CM

## 2022-11-27 DIAGNOSIS — Z79890 Hormone replacement therapy (postmenopausal): Secondary | ICD-10-CM | POA: Insufficient documentation

## 2022-12-08 ENCOUNTER — Ambulatory Visit: Payer: Medicare HMO | Admitting: Oncology

## 2022-12-28 ENCOUNTER — Other Ambulatory Visit: Payer: Self-pay | Admitting: Family Medicine

## 2023-01-07 ENCOUNTER — Other Ambulatory Visit: Payer: Self-pay | Admitting: Physician Assistant

## 2023-01-14 ENCOUNTER — Other Ambulatory Visit: Payer: Self-pay | Admitting: Family Medicine

## 2023-01-24 NOTE — Progress Notes (Unsigned)
Subjective:  Patient ID: Amy Mccann, female    DOB: [birth year]  Age: 82 y.o. MRN: [id number]  Chief Complaint  Patient presents with   Medical Management of Chronic Issues    HPI   Patient present today for follow up for diabetes, HTN, hyperlipidemia, and hypothyroidism. History of Present Illness The patient, with a history of diabetes, presents with intermittent sinus or inner ear issues. He describes the issue as not being an all-day occurrence but happens every once in a while. He has been taking over-the-counter sinus or allergy medication, but he is unsure if it is helping.  The patient's diabetes control is suboptimal with a recent A1c of 11.3, the highest he has ever had. He reports blood sugars ranging from 154 to 270. He is currently on Rybelsus, Tresiba, and Metformin for diabetes management. He has been checking his sugars twice daily. The patient has issues with medication costs and access, which may be affecting his diabetes management.  The patient is not currently exercising regularly but is open to incorporating exercise into his routine. He is also trying to eat healthily.  Diabetes:  Complications: polyneuropathy Glucose checking: Once daily. Glucose logs: 130-191. Hypoglycemia: no  Most recent A1C: 6.3 Current medications: none Last Eye Exam: 08/19/2022 Foot checks: Daily  Hyperlipidemia: Current medications: Patient is currently taking Rosuvastatin 5 mg take 1 tablet daily.  Hypertension associated with diabetes: Current medications: Patient is currently taking losartan 25 mg take 1 tablet daily.  Hypothyroidism: Patient is currently taking Levothyroxine 112 mcg take 1 tablet daily     09/25/2022    9:52 AM 03/05/2022   10:19 AM 03/05/2022    9:20 AM 02/24/2022    9:23 AM 08/29/2021    8:48 AM  Depression screen PHQ 2/9  Decreased Interest 0 0 1 0 0  Down, Depressed, Hopeless 0 0 0 0 0  PHQ - 2 Score 0 0 1 0 0  Altered sleeping  0     Tired,  decreased energy  0     Change in appetite  0     Feeling bad or failure about yourself   0     Trouble concentrating  0     Moving slowly or fidgety/restless  0     Suicidal thoughts  0     PHQ-9 Score  0           09/25/2022    9:52 AM  Fall Risk   Falls in the past year? 0  Number falls in past yr: 0  Injury with Fall? 0  Risk for fall due to : Impaired mobility  Follow up Falls evaluation completed;Falls prevention discussed    Patient Care Team: Blane Ohara, MD as PCP - General (Family Medicine) Arman Bogus, MD as Consulting Physician (Neurosurgery) Misenheimer, Marcial Pacas, MD as Consulting Physician (Unknown Physician Specialty) Dellia Beckwith, MD as Consulting Physician (Oncology) Rogelio Seen, OD (Optometry)   Review of Systems  Constitutional:  Negative for chills, fatigue and fever.  HENT:  Negative for congestion, ear pain and sore throat.   Respiratory:  Negative for cough and shortness of breath.   Cardiovascular:  Negative for chest pain.  Gastrointestinal:  Negative for abdominal pain, constipation, diarrhea, nausea and vomiting.  Genitourinary:  Negative for dysuria and urgency.  Musculoskeletal:  Positive for arthralgias. Negative for myalgias.  Skin:  Negative for rash.  Neurological:  Negative for dizziness and headaches.  Psychiatric/Behavioral:  Negative for dysphoric mood. The patient is  not nervous/anxious.     Current Outpatient Medications on File Prior to Visit  Medication Sig Dispense Refill   Accu-Chek Softclix Lancets lancets USE AS DIRECTED 100 each 5   acetaminophen (TYLENOL) 500 MG tablet Take 500 mg by mouth every 6 (six) hours as needed.     esomeprazole (NEXIUM) 40 MG capsule TAKE 1 CAPSULE(40 MG) BY MOUTH TWICE DAILY BEFORE A MEAL 180 capsule 3   glucose blood (ACCU-CHEK AVIVA PLUS) test strip USE TO CHECK FASTING BLOOD SUGAR ONCE DAILY AS DIRECTED 100 strip 3   levothyroxine (SYNTHROID) 112 MCG tablet TAKE 1 TABLET(112 MCG)  BY MOUTH DAILY BEFORE BREAKFAST 90 tablet 1   losartan (COZAAR) 25 MG tablet TAKE 1 TABLET BY MOUTH DAILY 90 tablet 1   Multiple Vitamin (MULTIVITAMIN PO) Take by mouth every other day.     Multiple Vitamin (MULTIVITAMIN) tablet Take 1 tablet by mouth daily.     Omega-3 Fatty Acids (FISH OIL) 1000 MG CAPS Take 1,000 mg by mouth daily.     ondansetron (ZOFRAN-ODT) 8 MG disintegrating tablet DISSOLVE 1/2 TABLET(4 MG) ON THE TONGUE EVERY 8 HOURS AS NEEDED FOR NAUSEA OR VOMITING 30 tablet 1   psyllium (METAMUCIL) 58.6 % packet Take by mouth.     rosuvastatin (CRESTOR) 5 MG tablet TAKE 1 TABLET(5 MG) BY MOUTH DAILY 90 tablet 1   No current facility-administered medications on file prior to visit.   Past Medical History:  Diagnosis Date   Age-related osteoporosis without current pathological fracture 07/03/2019   Cancer [facility]) 1990   Left Breast Cancer    Cancer (HCC) 2017   Thyroid Cancer   Cervicalgia 07/03/2019   Diabetes mellitus without complication (HCC)    Generalized anxiety disorder    GERD (gastroesophageal reflux disease)    Hypertension    Low back pain with sciatica 07/03/2019   Obesity due to excess calories 07/03/2019   S/P repair of paraesophageal hernia 10/20/2013   S/P TKR (total knee replacement) 07/25/2012   Past Surgical History:  Procedure Laterality Date   24 HOUR PH STUDY N/A 08/07/2013   Procedure: 24 HOUR PH STUDY;  Surgeon: Barrie Folk, MD;  Location: [facility];  Service: Endoscopy;  Laterality: N/A;   APPENDECTOMY  03/17/2016   ESOPHAGEAL MANOMETRY N/A 08/07/2013   Procedure: ESOPHAGEAL MANOMETRY (EM);  Surgeon: Barrie Folk, MD;  Location: [facility];  Service: Endoscopy;  Laterality: N/A;   HERNIA SURGERY  2015   HIATAL HERNIA SURGERY (FUNDOPLICATION.)   JOINT REPLACEMENT Right    right knee   MASTECTOMY Left 1990   THYROIDECTOMY Bilateral 08/2015    Family History  Problem Relation Age of Onset   Leukemia Brother    Social History    Socioeconomic History   Marital status: Divorced    Spouse name: Not on file   Number of children: 0   Years of education: Not on file   Highest education level: Not on file  Occupational History   Occupation: Retired from [employer]  Tobacco Use   Smoking status: Never    Passive exposure: Never   Smokeless tobacco: Never  Vaping Use   Vaping status: Never Used  Substance and Sexual Activity   Alcohol use: Never   Drug use: Never   Sexual activity: Not Currently  Other Topics Concern   Not on file  Social History Narrative   One sister lives near by, the other sister lives in [place]    Social Drivers of [employer]  Resource Strain: Low Risk  (02/24/2022)   Overall Financial Resource Strain (CARDIA)    Difficulty of Paying Living Expenses: Not hard at all  Food Insecurity: No Food Insecurity (02/24/2022)   Hunger Vital Sign    Worried About Running Out of Food in the Last Year: Never true    Ran Out of Food in the Last Year: Never true  Transportation Needs: No Transportation Needs (02/24/2022)   PRAPARE - Administrator, Civil Service (Medical): No    Lack of Transportation (Non-Medical): No  Physical Activity: Insufficiently Active (02/24/2022)   Exercise Vital Sign    Days of Exercise per Week: 7 days    Minutes of Exercise per Session: 20 min  Stress: No Stress Concern Present (02/24/2022)   [employer] of Occupational Health - Occupational Stress Questionnaire    Feeling of Stress : Not at all  Social Connections: Moderately Integrated (02/24/2022)   Social Connection and Isolation Panel [NHANES]    Frequency of Communication with Friends and Family: More than three times a week    Frequency of Social Gatherings with Friends and Family: More than three times a week    Attends Religious Services: More than 4 times per year    Active Member of [employer] or Organizations: No    Attends Engineer, structural: More than 4 times per year    Marital  Status: Divorced    Objective:  BP 126/64   Pulse 99   Temp 97.6 F (36.4 C)   Ht 4\' 11"  (1.499 m)   Wt 154 lb (69.9 kg)   SpO2 97%   BMI 31.10 kg/m      01/25/2023    9:04 AM 11/27/2022   10:39 AM 11/19/2022   10:03 AM  BP/Weight  Systolic BP 126 143 120  Diastolic BP 64 68 60  Wt. (Lbs) 154 156.6 156  BMI 31.1 kg/m2 31.63 kg/m2 31.51 kg/m2    Physical Exam Vitals reviewed.  Constitutional:      Appearance: Normal appearance. She is normal weight.  Neck:     Vascular: No carotid bruit.  Cardiovascular:     Rate and Rhythm: Normal rate and regular rhythm.     Heart sounds: Normal heart sounds.  Pulmonary:     Effort: Pulmonary effort is normal. No respiratory distress.     Breath sounds: Normal breath sounds.  Abdominal:     General: Abdomen is flat. Bowel sounds are normal.     Palpations: Abdomen is soft.     Tenderness: There is no abdominal tenderness.  Neurological:     Mental Status: She is alert and oriented to person, place, and time.  Psychiatric:        Mood and Affect: Mood normal.        Behavior: Behavior normal.     Diabetic Foot Exam - Simple   Simple Foot Form Diabetic Foot exam was performed with the following findings: Yes 01/25/2023  9:38 AM  Visual Inspection See comments: Yes Sensation Testing Intact to touch and monofilament testing bilaterally: Yes Pulse Check Posterior Tibialis and Dorsalis pulse intact bilaterally: Yes Comments Calluses on medial great toes. BL bunions (right > left) Thickened nails.       Lab Results  Component Value Date   WBC 8.2 01/25/2023   HGB 12.8 01/25/2023   HCT 39.4 01/25/2023   PLT 302 01/25/2023   GLUCOSE 116 (H) 01/25/2023   CHOL 161 01/25/2023   TRIG 132 01/25/2023  HDL 62 01/25/2023   LDLCALC 76 01/25/2023   ALT 18 01/25/2023   AST 20 01/25/2023   NA 134 01/25/2023   K 4.7 01/25/2023   CL 96 01/25/2023   CREATININE 0.87 01/25/2023   BUN 13 01/25/2023   CO2 22 01/25/2023   TSH  1.420 01/25/2023   INR 0.99 01/21/2010   HGBA1C 6.4 (H) 01/25/2023   MICROALBUR 30 04/05/2019      Assessment & Plan:    Hypertension complicating diabetes (HCC) Assessment & Plan: Hypertension and diabetes well controlled.  Recommend check sugars fasting daily. Recommend check feet daily. Recommend annual eye exams. Medicines: losartan 25 mg daily.  Continue to work on eating a healthy diet and exercise.  Labs drawn today.      Orders: -     CBC with Differential/Platelet -     Comprehensive metabolic panel  GERD without esophagitis Assessment & Plan: The current medical regimen is effective;  continue present plan and medications. nexium 40 mg twice daily   Mixed hyperlipidemia Assessment & Plan: Well controlled.  No changes to medicines. Continue Rosuvastatin 5 mg take 1 tablet daily. Continue to work on eating a healthy diet and exercise.  Labs drawn today.  Orders: -     Lipid panel  Postoperative hypothyroidism -     TSH  Type 2 diabetes mellitus with polyneuropathy (HCC) Assessment & Plan: Prediabetic 6.4 Recommend check sugars fasting daily runs between  Recommend check feet daily. Recommend annual eye exams. Medicines: None, patient says she wants to check sugar  Discussed for future diabetic medicine  Continue to work on eating a healthy diet and exercise.  Labs drawn today.    Orders: -     Hemoglobin A1c  Encounter for osteoporosis screening in asymptomatic postmenopausal patient -     DG Bone Density; Future    Assessment & Plan Uncontrolled Diabetes Mellitus A1c of 11.3, highest recorded for the patient. Patient reports morning blood glucose levels ranging from 154 to 270. Currently on Tresiba 22 units before bed, Rybelsus 14mg , and Metformin 1000mg  twice a day. Patient has been having issues with medication cost and adherence. -Continue current medications. -Check blood glucose levels before each meal. -Consider continuous glucose  monitoring for better understanding of daily glucose trends, despite patient's initial resistance.  Sinus/Allergy Symptoms Intermittent symptoms, not severe. Patient has been taking over-the-counter sinus/allergy medication with some relief. -Continue current over-the-counter medication as needed.  Hypertension Patient confirmed taking Lisinopril, dose not specified in conversation. -Continue Lisinopril, dose as previously prescribed.  Hyperlipidemia Patient confirmed taking Fenofibrate and Vascepa. -Continue Fenofibrate and Vascepa as previously prescribed.  Thyroid Disorder Patient confirmed taking thyroid medication, specific medication not mentioned in conversation. -Continue thyroid medication as previously prescribed.  General Health Maintenance -Ensure patient has access to necessary medications, address any issues with medication cost or availability. -Schedule earlier follow-up appointment due to patient's current state and recent hospitalization. -Encourage patient to incorporate regular exercise into daily routine.  No orders of the defined types were placed in this encounter.   Orders Placed This Encounter  Procedures   DG Bone Density   CBC with Differential/Platelet   Comprehensive metabolic panel   Hemoglobin A1c   Lipid panel   TSH     Follow-up: No follow-ups on file.   Clayborn Bigness I Leal-Borjas,acting as a scribe for Blane Ohara, MD.,have documented all relevant documentation on the behalf of Blane Ohara, MD,as directed by  Blane Ohara, MD while in the presence of Blane Ohara,  MD.   An After Visit Summary was printed and given to the patient.  Blane Ohara, MD [facility] [phone]

## 2023-01-25 ENCOUNTER — Ambulatory Visit: Payer: Medicare HMO | Admitting: Family Medicine

## 2023-01-25 ENCOUNTER — Encounter: Payer: Self-pay | Admitting: Family Medicine

## 2023-01-25 VITALS — BP 126/64 | HR 99 | Temp 97.6°F | Ht 59.0 in | Wt 154.0 lb

## 2023-01-25 DIAGNOSIS — K219 Gastro-esophageal reflux disease without esophagitis: Secondary | ICD-10-CM

## 2023-01-25 DIAGNOSIS — I10 Essential (primary) hypertension: Secondary | ICD-10-CM | POA: Diagnosis not present

## 2023-01-25 DIAGNOSIS — E782 Mixed hyperlipidemia: Secondary | ICD-10-CM

## 2023-01-25 DIAGNOSIS — E1159 Type 2 diabetes mellitus with other circulatory complications: Secondary | ICD-10-CM

## 2023-01-25 DIAGNOSIS — E890 Postprocedural hypothyroidism: Secondary | ICD-10-CM | POA: Diagnosis not present

## 2023-01-25 DIAGNOSIS — Z13820 Encounter for screening for osteoporosis: Secondary | ICD-10-CM

## 2023-01-25 DIAGNOSIS — Z780 Asymptomatic menopausal state: Secondary | ICD-10-CM | POA: Diagnosis not present

## 2023-01-25 DIAGNOSIS — E119 Type 2 diabetes mellitus without complications: Secondary | ICD-10-CM

## 2023-01-25 DIAGNOSIS — E1142 Type 2 diabetes mellitus with diabetic polyneuropathy: Secondary | ICD-10-CM | POA: Diagnosis not present

## 2023-01-26 LAB — LIPID PANEL
Chol/HDL Ratio: 2.6 {ratio} (ref 0.0–4.4)
Cholesterol, Total: 161 mg/dL (ref 100–199)
HDL: 62 mg/dL (ref >39)
LDL Chol Calc (NIH): 76 mg/dL (ref 0–99)
Triglycerides: 132 mg/dL (ref 0–149)
VLDL Cholesterol Cal: 23 mg/dL (ref 5–40)

## 2023-01-26 LAB — CBC WITH DIFFERENTIAL/PLATELET
Basophils Absolute: 0.1 10*3/uL (ref 0.0–0.2)
Basos: 1 %
EOS (ABSOLUTE): 0.1 10*3/uL (ref 0.0–0.4)
Eos: 1 %
Hematocrit: 39.4 % (ref 34.0–46.6)
Hemoglobin: 12.8 g/dL (ref 11.1–15.9)
Immature Grans (Abs): 0 10*3/uL (ref 0.0–0.1)
Immature Granulocytes: 0 %
Lymphocytes Absolute: 1.6 10*3/uL (ref 0.7–3.1)
Lymphs: 20 %
MCH: 31.5 pg (ref 26.6–33.0)
MCHC: 32.5 g/dL (ref 31.5–35.7)
MCV: 97 fL (ref 79–97)
Monocytes Absolute: 0.7 10*3/uL (ref 0.1–0.9)
Monocytes: 8 %
Neutrophils Absolute: 5.8 10*3/uL (ref 1.4–7.0)
Neutrophils: 70 %
Platelets: 302 10*3/uL (ref 150–450)
RBC: 4.06 x10E6/uL (ref 3.77–5.28)
RDW: 12.1 % (ref 11.7–15.4)
WBC: 8.2 10*3/uL (ref 3.4–10.8)

## 2023-01-26 LAB — COMPREHENSIVE METABOLIC PANEL
ALT: 18 [IU]/L (ref 0–32)
AST: 20 [IU]/L (ref 0–40)
Albumin: 4.2 g/dL (ref 3.7–4.7)
Alkaline Phosphatase: 127 [IU]/L — ABNORMAL HIGH (ref 44–121)
BUN/Creatinine Ratio: 15 (ref 12–28)
BUN: 13 mg/dL (ref 8–27)
Bilirubin Total: 0.4 mg/dL (ref 0.0–1.2)
CO2: 22 mmol/L (ref 20–29)
Calcium: 9.5 mg/dL (ref 8.7–10.3)
Chloride: 96 mmol/L (ref 96–106)
Creatinine, Ser: 0.87 mg/dL (ref 0.57–1.00)
Globulin, Total: 2.5 g/dL (ref 1.5–4.5)
Glucose: 116 mg/dL — ABNORMAL HIGH (ref 70–99)
Potassium: 4.7 mmol/L (ref 3.5–5.2)
Sodium: 134 mmol/L (ref 134–144)
Total Protein: 6.7 g/dL (ref 6.0–8.5)
eGFR: 66 mL/min/{1.73_m2} (ref >59)

## 2023-01-26 LAB — HEMOGLOBIN A1C
Est. average glucose Bld gHb Est-mCnc: 137 mg/dL
Hgb A1c MFr Bld: 6.4 % — ABNORMAL HIGH (ref 4.8–5.6)

## 2023-01-26 LAB — TSH: TSH: 1.42 u[IU]/mL (ref 0.450–4.500)

## 2023-01-27 NOTE — Assessment & Plan Note (Signed)
Well controlled.  No changes to medicines. Continue Rosuvastatin 5 mg take 1 tablet daily. Continue to work on eating a healthy diet and exercise.  Labs drawn today.

## 2023-01-27 NOTE — Assessment & Plan Note (Signed)
The current medical regimen is effective;  continue present plan and medications. nexium 40 mg twice daily

## 2023-01-27 NOTE — Assessment & Plan Note (Signed)
Hypertension and diabetes well controlled.  Recommend check sugars fasting daily. Recommend check feet daily. Recommend annual eye exams. Medicines: losartan 25 mg daily.  Continue to work on eating a healthy diet and exercise.  Labs drawn today.

## 2023-01-27 NOTE — Assessment & Plan Note (Signed)
Prediabetic 6.4 Recommend check sugars fasting daily runs between  Recommend check feet daily. Recommend annual eye exams. Medicines: None, patient says she wants to check sugar  Discussed for future diabetic medicine  Continue to work on eating a healthy diet and exercise.  Labs drawn today.

## 2023-02-11 ENCOUNTER — Telehealth: Payer: Self-pay | Admitting: Family Medicine

## 2023-02-11 NOTE — Telephone Encounter (Signed)
   Amy Mccann has been scheduled for the following appointment:  WHAT: BONE DENSITY WHERE: [facility] DATE: 04/16/2023 TIME: 9:00 AM CHECK-IN  Patient has been made aware.

## 2023-03-05 DIAGNOSIS — E079 Disorder of thyroid, unspecified: Secondary | ICD-10-CM | POA: Diagnosis not present

## 2023-03-05 DIAGNOSIS — Z85850 Personal history of malignant neoplasm of thyroid: Secondary | ICD-10-CM | POA: Diagnosis not present

## 2023-03-05 DIAGNOSIS — S0990XA Unspecified injury of head, initial encounter: Secondary | ICD-10-CM | POA: Diagnosis not present

## 2023-03-05 DIAGNOSIS — S0083XA Contusion of other part of head, initial encounter: Secondary | ICD-10-CM | POA: Diagnosis not present

## 2023-03-05 DIAGNOSIS — R519 Headache, unspecified: Secondary | ICD-10-CM | POA: Diagnosis not present

## 2023-03-05 DIAGNOSIS — S80912A Unspecified superficial injury of left knee, initial encounter: Secondary | ICD-10-CM | POA: Diagnosis not present

## 2023-03-05 DIAGNOSIS — S80919A Unspecified superficial injury of unspecified knee, initial encounter: Secondary | ICD-10-CM | POA: Diagnosis not present

## 2023-03-05 DIAGNOSIS — Z853 Personal history of malignant neoplasm of breast: Secondary | ICD-10-CM | POA: Diagnosis not present

## 2023-03-05 DIAGNOSIS — S199XXA Unspecified injury of neck, initial encounter: Secondary | ICD-10-CM | POA: Diagnosis not present

## 2023-03-05 DIAGNOSIS — S299XXA Unspecified injury of thorax, initial encounter: Secondary | ICD-10-CM | POA: Diagnosis not present

## 2023-03-05 DIAGNOSIS — M1990 Unspecified osteoarthritis, unspecified site: Secondary | ICD-10-CM | POA: Diagnosis not present

## 2023-03-05 DIAGNOSIS — S80212A Abrasion, left knee, initial encounter: Secondary | ICD-10-CM | POA: Diagnosis not present

## 2023-03-05 DIAGNOSIS — Z79899 Other long term (current) drug therapy: Secondary | ICD-10-CM | POA: Diagnosis not present

## 2023-03-05 DIAGNOSIS — M25561 Pain in right knee: Secondary | ICD-10-CM | POA: Diagnosis not present

## 2023-03-05 DIAGNOSIS — K219 Gastro-esophageal reflux disease without esophagitis: Secondary | ICD-10-CM | POA: Diagnosis not present

## 2023-03-05 DIAGNOSIS — S8991XA Unspecified injury of right lower leg, initial encounter: Secondary | ICD-10-CM | POA: Diagnosis not present

## 2023-03-05 DIAGNOSIS — Z79890 Hormone replacement therapy (postmenopausal): Secondary | ICD-10-CM | POA: Diagnosis not present

## 2023-03-05 DIAGNOSIS — E119 Type 2 diabetes mellitus without complications: Secondary | ICD-10-CM | POA: Diagnosis not present

## 2023-03-05 DIAGNOSIS — M25551 Pain in right hip: Secondary | ICD-10-CM | POA: Diagnosis not present

## 2023-03-05 DIAGNOSIS — M25562 Pain in left knee: Secondary | ICD-10-CM | POA: Diagnosis not present

## 2023-03-05 DIAGNOSIS — R918 Other nonspecific abnormal finding of lung field: Secondary | ICD-10-CM | POA: Diagnosis not present

## 2023-03-05 DIAGNOSIS — M47812 Spondylosis without myelopathy or radiculopathy, cervical region: Secondary | ICD-10-CM | POA: Diagnosis not present

## 2023-03-05 DIAGNOSIS — R0781 Pleurodynia: Secondary | ICD-10-CM | POA: Diagnosis not present

## 2023-03-05 DIAGNOSIS — E785 Hyperlipidemia, unspecified: Secondary | ICD-10-CM | POA: Diagnosis not present

## 2023-03-08 ENCOUNTER — Ambulatory Visit: Payer: Self-pay | Admitting: Family Medicine

## 2023-03-08 ENCOUNTER — Telehealth: Payer: Self-pay
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
## 2023-03-08 NOTE — Transitions of Care (Post Inpatient/ED Visit) (Signed)
   03/08/2023  Name: ESTELENE CARMACK MRN: [id number] DOB: 27-Mar-1940  Today's TOC FU Call Status: Today's TOC FU Call Status:: Unsuccessful Call (1st Attempt) Unsuccessful Call (1st Attempt) Date: 03/08/23  Attempted to reach the patient regarding the most recent Inpatient/ED visit.  Follow Up Plan: Additional outreach attempts will be made to reach the patient to complete the Transitions of Care (Post Inpatient/ED visit) call.   Signature Creola Corn, LPN  40/98/11 91:47 AM
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
## 2023-03-08 NOTE — Telephone Encounter (Signed)
Recommend patient call or return to ED if worsening headache or confusion. For now patient may stay home. Dr. Sedalia Muta

## 2023-03-09 ENCOUNTER — Other Ambulatory Visit: Payer: Self-pay | Admitting: Family Medicine

## 2023-03-09 NOTE — Telephone Encounter (Signed)
Patient informed and verbalized understanding

## 2023-03-18 DIAGNOSIS — M25561 Pain in right knee: Secondary | ICD-10-CM | POA: Diagnosis not present

## 2023-03-18 DIAGNOSIS — M25562 Pain in left knee: Secondary | ICD-10-CM | POA: Diagnosis not present

## 2023-03-24 ENCOUNTER — Telehealth: Payer: Self-pay
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
## 2023-03-26 ENCOUNTER — Inpatient Hospital Stay: Payer: Medicare HMO | Attending: Oncology | Admitting: Oncology

## 2023-03-26 ENCOUNTER — Other Ambulatory Visit: Payer: Self-pay | Admitting: Oncology

## 2023-03-26 ENCOUNTER — Encounter: Payer: Self-pay | Admitting: Oncology

## 2023-03-26 VITALS — BP 137/65 | HR 81 | Temp 97.9°F | Resp 20 | Ht 59.0 in | Wt 161.5 lb

## 2023-03-26 DIAGNOSIS — K219 Gastro-esophageal reflux disease without esophagitis: Secondary | ICD-10-CM | POA: Diagnosis not present

## 2023-03-26 DIAGNOSIS — Z806 Family history of leukemia: Secondary | ICD-10-CM | POA: Diagnosis not present

## 2023-03-26 DIAGNOSIS — Z880 Allergy status to penicillin: Secondary | ICD-10-CM | POA: Insufficient documentation

## 2023-03-26 DIAGNOSIS — Z9049 Acquired absence of other specified parts of digestive tract: Secondary | ICD-10-CM | POA: Diagnosis not present

## 2023-03-26 DIAGNOSIS — Z9012 Acquired absence of left breast and nipple: Secondary | ICD-10-CM | POA: Insufficient documentation

## 2023-03-26 DIAGNOSIS — Z08 Encounter for follow-up examination after completed treatment for malignant neoplasm: Secondary | ICD-10-CM

## 2023-03-26 DIAGNOSIS — Z79899 Other long term (current) drug therapy: Secondary | ICD-10-CM | POA: Insufficient documentation

## 2023-03-26 DIAGNOSIS — M85859 Other specified disorders of bone density and structure, unspecified thigh: Secondary | ICD-10-CM | POA: Diagnosis not present

## 2023-03-26 DIAGNOSIS — E890 Postprocedural hypothyroidism: Secondary | ICD-10-CM | POA: Insufficient documentation

## 2023-03-26 DIAGNOSIS — Z885 Allergy status to narcotic agent status: Secondary | ICD-10-CM | POA: Insufficient documentation

## 2023-03-26 DIAGNOSIS — Z79890 Hormone replacement therapy (postmenopausal): Secondary | ICD-10-CM | POA: Diagnosis not present

## 2023-03-26 DIAGNOSIS — M2550 Pain in unspecified joint: Secondary | ICD-10-CM | POA: Insufficient documentation

## 2023-03-26 DIAGNOSIS — I10 Essential (primary) hypertension: Secondary | ICD-10-CM | POA: Diagnosis not present

## 2023-03-26 DIAGNOSIS — Z85850 Personal history of malignant neoplasm of thyroid: Secondary | ICD-10-CM | POA: Diagnosis not present

## 2023-03-26 DIAGNOSIS — E119 Type 2 diabetes mellitus without complications: Secondary | ICD-10-CM | POA: Diagnosis not present

## 2023-03-26 DIAGNOSIS — Z853 Personal history of malignant neoplasm of breast: Secondary | ICD-10-CM | POA: Insufficient documentation

## 2023-03-26 DIAGNOSIS — M8580 Other specified disorders of bone density and structure, unspecified site: Secondary | ICD-10-CM | POA: Diagnosis not present

## 2023-03-26 DIAGNOSIS — M47812 Spondylosis without myelopathy or radiculopathy, cervical region: Secondary | ICD-10-CM | POA: Diagnosis not present

## 2023-03-29 ENCOUNTER — Ambulatory Visit: Payer: Medicare HMO

## 2023-03-29 VITALS — Ht 59.0 in | Wt 158.0 lb

## 2023-03-29 DIAGNOSIS — Z0000 Encounter for general adult medical examination without abnormal findings: Secondary | ICD-10-CM | POA: Diagnosis not present

## 2023-03-29 NOTE — Patient Instructions (Signed)
 Amy Mccann , Thank you for taking time to come for your Medicare Wellness Visit. I appreciate your ongoing commitment to your health goals. Please review the following plan we discussed and let me know if I can assist you in the future.   Referrals/Orders/Follow-Ups/Clinician Recommendations: Yes; Keep maintaining your health by keeping your appointments with Dr. Sedalia Muta and any specialists that you may see.  Call us if you need anything.  Have a great year!!!!  This is a list of the screening recommended for you and due dates:  Health Maintenance  Topic Date Due   DEXA scan (bone density measurement)  10/17/2022   Zoster (Shingles) Vaccine (2 of 2) 01/27/2023   COVID-19 Vaccine (8 - 2024-25 season) 01/25/2024*   Yearly kidney health urinalysis for diabetes  06/05/2023   Hemoglobin A1C  07/26/2023   Eye exam for diabetics  08/19/2023   Mammogram  10/08/2023   Yearly kidney function blood test for diabetes  01/25/2024   Complete foot exam   01/25/2024   Medicare Annual Wellness Visit  03/28/2024   DTaP/Tdap/Td vaccine (2 - Td or Tdap) 11/05/2026   Pneumonia Vaccine  Completed   Flu Shot  Completed   HPV Vaccine  Aged Out  *Topic was postponed. The date shown is not the original due date.    Advanced directives: (Copy Requested) Please bring a copy of your health care power of attorney and living will to the office to be added to your chart at your convenience.  Next Medicare Annual Wellness Visit scheduled for next year: Yes
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
## 2023-04-02 DIAGNOSIS — M25561 Pain in right knee: Secondary | ICD-10-CM | POA: Diagnosis not present

## 2023-04-02 DIAGNOSIS — M25662 Stiffness of left knee, not elsewhere classified: Secondary | ICD-10-CM | POA: Diagnosis not present

## 2023-04-02 DIAGNOSIS — M6281 Muscle weakness (generalized): Secondary | ICD-10-CM | POA: Diagnosis not present

## 2023-04-05 DIAGNOSIS — M25662 Stiffness of left knee, not elsewhere classified: Secondary | ICD-10-CM | POA: Diagnosis not present

## 2023-04-05 DIAGNOSIS — M25561 Pain in right knee: Secondary | ICD-10-CM | POA: Diagnosis not present

## 2023-04-05 DIAGNOSIS — M6281 Muscle weakness (generalized): Secondary | ICD-10-CM | POA: Diagnosis not present

## 2023-04-07 DIAGNOSIS — M25561 Pain in right knee: Secondary | ICD-10-CM | POA: Diagnosis not present

## 2023-04-07 DIAGNOSIS — M25662 Stiffness of left knee, not elsewhere classified: Secondary | ICD-10-CM | POA: Diagnosis not present

## 2023-04-07 DIAGNOSIS — M6281 Muscle weakness (generalized): Secondary | ICD-10-CM | POA: Diagnosis not present

## 2023-04-16 DIAGNOSIS — N959 Unspecified menopausal and perimenopausal disorder: Secondary | ICD-10-CM | POA: Diagnosis not present

## 2023-04-16 DIAGNOSIS — M6281 Muscle weakness (generalized): Secondary | ICD-10-CM | POA: Diagnosis not present

## 2023-04-16 DIAGNOSIS — M25561 Pain in right knee: Secondary | ICD-10-CM | POA: Diagnosis not present

## 2023-04-16 DIAGNOSIS — M25662 Stiffness of left knee, not elsewhere classified: Secondary | ICD-10-CM | POA: Diagnosis not present

## 2023-04-16 LAB — HM DEXA SCAN

## 2023-04-19 DIAGNOSIS — M85851 Other specified disorders of bone density and structure, right thigh: Secondary | ICD-10-CM | POA: Diagnosis not present

## 2023-04-19 DIAGNOSIS — M85852 Other specified disorders of bone density and structure, left thigh: Secondary | ICD-10-CM | POA: Diagnosis not present

## 2023-04-19 DIAGNOSIS — Z13820 Encounter for screening for osteoporosis: Secondary | ICD-10-CM | POA: Diagnosis not present

## 2023-04-20 ENCOUNTER — Encounter: Payer: Self-pay | Admitting: Family Medicine

## 2023-04-22 ENCOUNTER — Other Ambulatory Visit: Payer: Self-pay | Admitting: Family Medicine

## 2023-04-22 DIAGNOSIS — L84 Corns and callosities: Secondary | ICD-10-CM | POA: Diagnosis not present

## 2023-04-27 ENCOUNTER — Encounter: Payer: Self-pay | Admitting: Family Medicine

## 2023-04-27 ENCOUNTER — Ambulatory Visit (INDEPENDENT_AMBULATORY_CARE_PROVIDER_SITE_OTHER): Admitting: Family Medicine

## 2023-04-27 VITALS — BP 122/64 | HR 78 | Temp 97.8°F | Ht 59.0 in | Wt 158.0 lb

## 2023-04-27 DIAGNOSIS — J028 Acute pharyngitis due to other specified organisms: Secondary | ICD-10-CM

## 2023-04-27 DIAGNOSIS — M25561 Pain in right knee: Secondary | ICD-10-CM | POA: Diagnosis not present

## 2023-04-27 DIAGNOSIS — M25562 Pain in left knee: Secondary | ICD-10-CM | POA: Diagnosis not present

## 2023-04-27 MED ORDER — LORATADINE 10 MG PO TABS: 10.0000 mg | ORAL_TABLET | Freq: Every day | ORAL | 2 refills | Status: DC

## 2023-04-27 MED ORDER — AZITHROMYCIN 250 MG PO TABS: ORAL_TABLET | ORAL | 0 refills | Status: DC

## 2023-04-27 NOTE — Progress Notes (Signed)
 Acute Office Visit  Subjective:    Patient ID: Amy Mccann, female    DOB: 10/04/1940, 83 y.o.   MRN: [id number]  Chief Complaint  Patient presents with   Cough   Discussed the use of AI scribe software for clinical note transcription with the patient, who gave verbal consent to proceed.  HPI: Patient is in today for sore throat x 6 days, scratchy throat with mild cough, no fever/chills. OTC cough med has helped some. Able to eat and drink.   Past Medical History:  Diagnosis Date   Age-related osteoporosis without current pathological fracture 07/03/2019   Cancer [facility]) 1990   Left Breast Cancer    Cancer (HCC) 2017   Thyroid Cancer   Cervicalgia 07/03/2019   Diabetes mellitus without complication (HCC)    Generalized anxiety disorder    GERD (gastroesophageal reflux disease)    Hypertension    Low back pain with sciatica 07/03/2019   Obesity due to excess calories 07/03/2019   S/P repair of paraesophageal hernia 10/20/2013   S/P TKR (total knee replacement) 07/25/2012    Past Surgical History:  Procedure Laterality Date   24 HOUR PH STUDY N/A 08/07/2013   Procedure: 24 HOUR PH STUDY;  Surgeon: Barrie Folk, MD;  [facility];  Service: Endoscopy;  Laterality: N/A;   APPENDECTOMY  03/17/2016   ESOPHAGEAL MANOMETRY N/A 08/07/2013   Procedure: ESOPHAGEAL MANOMETRY (EM);  Surgeon: Barrie Folk, MD;  Location: [facility];  Service: Endoscopy;  Laterality: N/A;   HERNIA SURGERY  2015   HIATAL HERNIA SURGERY (FUNDOPLICATION.)   JOINT REPLACEMENT Right    right knee   MASTECTOMY Left 1990   THYROIDECTOMY Bilateral 08/2015    Family History  Problem Relation Age of Onset   Leukemia Brother     Social History   Socioeconomic History   Marital status: Divorced    Spouse name: Not on file   Number of children: 0   Years of education: Not on file   Highest education level: Not on file  Occupational History   Occupation: Retired from [employer]  Tobacco Use    Smoking status: Never    Passive exposure: Never   Smokeless tobacco: Never  Vaping Use   Vaping status: Never Used  Substance and Sexual Activity   Alcohol use: Never   Drug use: Never   Sexual activity: Not Currently  Other Topics Concern   Not on file  Social History Narrative   One sister lives near by, the other sister lives in [place]    Social Drivers of Health   Financial Resource Strain: Low Risk  (03/29/2023)   Overall Financial Resource Strain (CARDIA)    Difficulty of Paying Living Expenses: Not hard at all  Food Insecurity: No Food Insecurity (03/29/2023)   Hunger Vital Sign    Worried About Running Out of Food in the Last Year: Never true    Ran Out of Food in the Last Year: Never true  Transportation Needs: No Transportation Needs (03/29/2023)   PRAPARE - Administrator, Civil Service (Medical): No    Lack of Transportation (Non-Medical): No  Physical Activity: Insufficiently Active (03/29/2023)   Exercise Vital Sign    Days of Exercise per Week: 7 days    Minutes of Exercise per Session: 20 min  Stress: No Stress Concern Present (03/29/2023)   [employer] of Occupational Health - Occupational Stress Questionnaire    Feeling of Stress : Not at all  Social Connections: Moderately Integrated (03/29/2023)   Social Connection and Isolation Panel [NHANES]    Frequency of Communication with Friends and Family: More than three times a week    Frequency of Social Gatherings with Friends and Family: More than three times a week    Attends Religious Services: More than 4 times per year    Active Member of [employer] or Organizations: No    Attends Engineer, structural: More than 4 times per year    Marital Status: Divorced  Catering manager Violence: Not At Risk (03/29/2023)   Humiliation, Afraid, Rape, and Kick questionnaire    Fear of Current or Ex-Partner: No    Emotionally Abused: No    Physically Abused: No    Sexually Abused: No    Outpatient  Medications Prior to Visit  Medication Sig Dispense Refill   Accu-Chek Softclix Lancets lancets USE AS DIRECTED 100 each 5   acetaminophen (TYLENOL) 500 MG tablet Take 500 mg by mouth every 6 (six) hours as needed.     esomeprazole (NEXIUM) 40 MG capsule TAKE 1 CAPSULE(40 MG) BY MOUTH TWICE DAILY BEFORE A MEAL 180 capsule 3   glucose blood (ACCU-CHEK AVIVA PLUS) test strip USE TO CHECK FASTING BLOOD SUGAR ONCE DAILY AS DIRECTED 100 strip 3   levothyroxine (SYNTHROID) 112 MCG tablet TAKE 1 TABLET(112 MCG) BY MOUTH DAILY BEFORE BREAKFAST 90 tablet 1   losartan (COZAAR) 25 MG tablet TAKE 1 TABLET BY MOUTH DAILY 90 tablet 1   Multiple Vitamin (MULTIVITAMIN PO) Take by mouth every other day.     Multiple Vitamin (MULTIVITAMIN) tablet Take 1 tablet by mouth daily.     Omega-3 Fatty Acids (FISH OIL) 1000 MG CAPS Take 1,000 mg by mouth daily.     ondansetron (ZOFRAN-ODT) 8 MG disintegrating tablet DISSOLVE 1/2 TABLET(4 MG) ON THE TONGUE EVERY 8 HOURS AS NEEDED FOR NAUSEA OR VOMITING 30 tablet 1   psyllium (METAMUCIL) 58.6 % packet Take by mouth.     rosuvastatin (CRESTOR) 5 MG tablet TAKE 1 TABLET(5 MG) BY MOUTH DAILY 90 tablet 1   No facility-administered medications prior to visit.    Allergies  Allergen Reactions   Ergocalciferol Other (See Comments)    somnolence   Codeine Itching and Rash   Penicillins Rash    Review of Systems  Constitutional:  Negative for chills, fatigue and fever.  HENT:  Positive for congestion, rhinorrhea and sore throat. Negative for ear pain, postnasal drip, sinus pressure and sinus pain.   Respiratory:  Negative for cough and shortness of breath.   Cardiovascular:  Negative for chest pain.       Objective:        04/27/2023   10:15 AM 03/29/2023    1:42 PM 03/26/2023    2:18 PM  Vitals with BMI  Height 4\' 11"  4\' 11"  4\' 11"   Weight 158 lbs 158 lbs 161 lbs 8 oz  BMI 31.89 31.89 32.6  Systolic 122  137  Diastolic 64  65  Pulse 78  81    No data  found.   Physical Exam Vitals reviewed.  Constitutional:      Appearance: Normal appearance.  HENT:     Right Ear: Tympanic membrane, ear canal and external ear normal.     Left Ear: Tympanic membrane, ear canal and external ear normal.     Nose: Congestion present.     Mouth/Throat:     Pharynx: Oropharynx is clear. Posterior oropharyngeal erythema present. No oropharyngeal exudate.  Cardiovascular:     Rate and Rhythm: Normal rate and regular rhythm.     Heart sounds: Normal heart sounds. No murmur heard. Pulmonary:     Effort: Pulmonary effort is normal. No respiratory distress.     Breath sounds: Normal breath sounds.  Lymphadenopathy:     Cervical: No cervical adenopathy.  Neurological:     Mental Status: She is alert and oriented to person, place, and time.  Psychiatric:        Mood and Affect: Mood normal.        Behavior: Behavior normal.     Health Maintenance Due  Topic Date Due   Zoster Vaccines- Shingrix (2 of 2) 01/27/2023    There are no preventive care reminders to display for this patient.   Lab Results  Component Value Date   TSH 1.420 01/25/2023   Lab Results  Component Value Date   WBC 8.2 01/25/2023   HGB 12.8 01/25/2023   HCT 39.4 01/25/2023   MCV 97 01/25/2023   PLT 302 01/25/2023   Lab Results  Component Value Date   NA 134 01/25/2023   K 4.7 01/25/2023   CO2 22 01/25/2023   GLUCOSE 116 (H) 01/25/2023   BUN 13 01/25/2023   CREATININE 0.87 01/25/2023   BILITOT 0.4 01/25/2023   ALKPHOS 127 (H) 01/25/2023   AST 20 01/25/2023   ALT 18 01/25/2023   PROT 6.7 01/25/2023   ALBUMIN 4.2 01/25/2023   CALCIUM 9.5 01/25/2023   EGFR 66 01/25/2023   Lab Results  Component Value Date   CHOL 161 01/25/2023   Lab Results  Component Value Date   HDL 62 01/25/2023   Lab Results  Component Value Date   LDLCALC 76 01/25/2023   Lab Results  Component Value Date   TRIG 132 01/25/2023   Lab Results  Component Value Date   CHOLHDL 2.6  01/25/2023   Lab Results  Component Value Date   HGBA1C 6.4 (H) 01/25/2023       Assessment & Plan:  Acute pharyngitis due to other specified organisms -     Azithromycin; 2 DAILY FOR FIRST DAY, THEN DECREASE TO ONE DAILY FOR 4 MORE DAYS.  Dispense: 6 each; Refill: 0 -     Loratadine; Take 1 tablet (10 mg total) by mouth daily.  Dispense: 30 tablet; Refill: 2    Meds ordered this encounter  Medications   azithromycin (ZITHROMAX Z-PAK) 250 MG tablet    Sig: 2 DAILY FOR FIRST DAY, THEN DECREASE TO ONE DAILY FOR 4 MORE DAYS.    Dispense:  6 each    Refill:  0   loratadine (CLARITIN) 10 MG tablet    Sig: Take 1 tablet (10 mg total) by mouth daily.    Dispense:  30 tablet    Refill:  2    No orders of the defined types were placed in this encounter.    Follow-up: No follow-ups on file.  An After Visit Summary was printed and given to the patient.  Blane Ohara, MD [facility] [phone]

## 2023-04-28 DIAGNOSIS — M25662 Stiffness of left knee, not elsewhere classified: Secondary | ICD-10-CM | POA: Diagnosis not present

## 2023-04-28 DIAGNOSIS — M6281 Muscle weakness (generalized): Secondary | ICD-10-CM | POA: Diagnosis not present

## 2023-04-28 DIAGNOSIS — M25561 Pain in right knee: Secondary | ICD-10-CM | POA: Diagnosis not present

## 2023-05-07 ENCOUNTER — Ambulatory Visit: Payer: Medicare HMO | Admitting: Family Medicine

## 2023-05-12 ENCOUNTER — Other Ambulatory Visit: Payer: Self-pay | Admitting: Family Medicine

## 2023-05-12 DIAGNOSIS — K219 Gastro-esophageal reflux disease without esophagitis: Secondary | ICD-10-CM

## 2023-05-19 DIAGNOSIS — M25662 Stiffness of left knee, not elsewhere classified: Secondary | ICD-10-CM | POA: Diagnosis not present

## 2023-05-19 DIAGNOSIS — M25561 Pain in right knee: Secondary | ICD-10-CM | POA: Diagnosis not present

## 2023-05-19 DIAGNOSIS — M6281 Muscle weakness (generalized): Secondary | ICD-10-CM | POA: Diagnosis not present

## 2023-05-20 ENCOUNTER — Encounter: Payer: Medicare HMO | Admitting: Family Medicine

## 2023-05-20 NOTE — Progress Notes (Deleted)
 Subjective:  Patient ID: Amy Mccann, female    DOB: Jan 15, 1941  Age: 83 y.o. MRN: [id number]  Chief Complaint  Patient presents with   Medical Management of Chronic Issues    Discussed the use of AI scribe software for clinical note transcription with the patient, who gave verbal consent to proceed.  Diabetes:  Complications: polyneuropathy Glucose checking: Once daily. Glucose logs: 130-191. Hypoglycemia: no  Most recent A1C: 6.4 Current medications: none Last Eye Exam: 08/19/2022 Foot checks: Daily  Hyperlipidemia: Current medications: Patient is currently taking Rosuvastatin 5 mg take 1 tablet daily.  Hypertension associated with diabetes: Current medications: Patient is currently taking losartan 25 mg take 1 tablet daily.  Hypothyroidism: Patient is currently taking Levothyroxine 112 mcg take 1 tablet daily       03/29/2023    1:48 PM 09/25/2022    9:52 AM 03/05/2022   10:19 AM 03/05/2022    9:20 AM 02/24/2022    9:23 AM  Depression screen PHQ 2/9  Decreased Interest 0 0 0 1 0  Down, Depressed, Hopeless 1 0 0 0 0  PHQ - 2 Score 1 0 0 1 0  Altered sleeping 0  0    Tired, decreased energy 1  0    Change in appetite 0  0    Feeling bad or failure about yourself  0  0    Trouble concentrating 0  0    Moving slowly or fidgety/restless 0  0    Suicidal thoughts 0  0    PHQ-9 Score 2  0    Difficult doing work/chores Not difficult at all            03/29/2023    1:48 PM  Fall Risk   Falls in the past year? 1  Number falls in past yr: 0  Injury with Fall? 1  Risk for fall due to : History of fall(s);Orthopedic patient;Impaired balance/gait    Patient Care Team: Blane Ohara, MD as PCP - General (Family Medicine) Arman Bogus, MD as Consulting Physician (Neurosurgery) Misenheimer, Marcial Pacas, MD as Consulting Physician (Unknown Physician Specialty) Dellia Beckwith, MD as Consulting Physician (Oncology) Rogelio Seen, OD (Optometry)   Review of  Systems  Current Outpatient Medications on File Prior to Visit  Medication Sig Dispense Refill   Accu-Chek Softclix Lancets lancets USE AS DIRECTED 100 each 5   acetaminophen (TYLENOL) 500 MG tablet Take 500 mg by mouth every 6 (six) hours as needed.     azithromycin (ZITHROMAX Z-PAK) 250 MG tablet 2 DAILY FOR FIRST DAY, THEN DECREASE TO ONE DAILY FOR 4 MORE DAYS. 6 each 0   esomeprazole (NEXIUM) 40 MG capsule TAKE 1 CAPSULE(40 MG) BY MOUTH TWICE DAILY BEFORE A MEAL 180 capsule 3   glucose blood (ACCU-CHEK AVIVA PLUS) test strip USE TO CHECK FASTING BLOOD SUGAR ONCE DAILY AS DIRECTED 100 strip 3   levothyroxine (SYNTHROID) 112 MCG tablet TAKE 1 TABLET(112 MCG) BY MOUTH DAILY BEFORE BREAKFAST 90 tablet 1   loratadine (CLARITIN) 10 MG tablet Take 1 tablet (10 mg total) by mouth daily. 30 tablet 2   losartan (COZAAR) 25 MG tablet TAKE 1 TABLET BY MOUTH DAILY 90 tablet 1   Multiple Vitamin (MULTIVITAMIN PO) Take by mouth every other day.     Multiple Vitamin (MULTIVITAMIN) tablet Take 1 tablet by mouth daily.     Omega-3 Fatty Acids (FISH OIL) 1000 MG CAPS Take 1,000 mg by mouth daily.     ondansetron (ZOFRAN-ODT)  8 MG disintegrating tablet DISSOLVE 1/2 TABLET(4 MG) ON THE TONGUE EVERY 8 HOURS AS NEEDED FOR NAUSEA OR VOMITING 30 tablet 1   psyllium (METAMUCIL) 58.6 % packet Take by mouth.     rosuvastatin (CRESTOR) 5 MG tablet TAKE 1 TABLET(5 MG) BY MOUTH DAILY 90 tablet 1   No current facility-administered medications on file prior to visit.   Past Medical History:  Diagnosis Date   Age-related osteoporosis without current pathological fracture 07/03/2019   Cancer [facility]) 1990   Left Breast Cancer    Cancer (HCC) 2017   Thyroid Cancer   Cervicalgia 07/03/2019   Diabetes mellitus without complication (HCC)    Generalized anxiety disorder    GERD (gastroesophageal reflux disease)    Hypertension    Low back pain with sciatica 07/03/2019   Obesity due to excess calories 07/03/2019   S/P repair  of paraesophageal hernia 10/20/2013   S/P TKR (total knee replacement) 07/25/2012   Past Surgical History:  Procedure Laterality Date   24 HOUR PH STUDY N/A 08/07/2013   Procedure: 24 HOUR PH STUDY;  Surgeon: Barrie Folk, MD;  Location: [facility];  Service: Endoscopy;  Laterality: N/A;   APPENDECTOMY  03/17/2016   ESOPHAGEAL MANOMETRY N/A 08/07/2013   Procedure: ESOPHAGEAL MANOMETRY (EM);  Surgeon: Barrie Folk, MD;  Location: [facility];  Service: Endoscopy;  Laterality: N/A;   HERNIA SURGERY  2015   HIATAL HERNIA SURGERY (FUNDOPLICATION.)   JOINT REPLACEMENT Right    right knee   MASTECTOMY Left 1990   THYROIDECTOMY Bilateral 08/2015    Family History  Problem Relation Age of Onset   Leukemia Brother    Social History   Socioeconomic History   Marital status: Divorced    Spouse name: Not on file   Number of children: 0   Years of education: Not on file   Highest education level: Not on file  Occupational History   Occupation: Retired from [employer]  Tobacco Use   Smoking status: Never    Passive exposure: Never   Smokeless tobacco: Never  Vaping Use   Vaping status: Never Used  Substance and Sexual Activity   Alcohol use: Never   Drug use: Never   Sexual activity: Not Currently  Other Topics Concern   Not on file  Social History Narrative   One sister lives near by, the other sister lives in [place]    Social Drivers of Health   Financial Resource Strain: Low Risk  (03/29/2023)   Overall Financial Resource Strain (CARDIA)    Difficulty of Paying Living Expenses: Not hard at all  Food Insecurity: No Food Insecurity (03/29/2023)   Hunger Vital Sign    Worried About Running Out of Food in the Last Year: Never true    Ran Out of Food in the Last Year: Never true  Transportation Needs: No Transportation Needs (03/29/2023)   PRAPARE - Administrator, Civil Service (Medical): No    Lack of Transportation (Non-Medical): No  Physical Activity: Insufficiently  Active (03/29/2023)   Exercise Vital Sign    Days of Exercise per Week: 7 days    Minutes of Exercise per Session: 20 min  Stress: No Stress Concern Present (03/29/2023)   [employer] of Occupational Health - Occupational Stress Questionnaire    Feeling of Stress : Not at all  Social Connections: Moderately Integrated (03/29/2023)   Social Connection and Isolation Panel [NHANES]    Frequency of Communication with Friends and Family: More than three  times a week    Frequency of Social Gatherings with Friends and Family: More than three times a week    Attends Religious Services: More than 4 times per year    Active Member of Clubs or Organizations: No    Attends Engineer, structural: More than 4 times per year    Marital Status: Divorced    Objective:  There were no vitals taken for this visit.     04/27/2023   10:15 AM 03/29/2023    1:42 PM 03/26/2023    2:18 PM  BP/Weight  Systolic BP 122  956  Diastolic BP 64  65  Wt. (Lbs) 158 158 161.5  BMI 31.91 kg/m2 31.91 kg/m2 32.62 kg/m2    Physical Exam  Diabetic Foot Exam - Simple   No data filed      Lab Results  Component Value Date   WBC 8.2 01/25/2023   HGB 12.8 01/25/2023   HCT 39.4 01/25/2023   PLT 302 01/25/2023   GLUCOSE 116 (H) 01/25/2023   CHOL 161 01/25/2023   TRIG 132 01/25/2023   HDL 62 01/25/2023   LDLCALC 76 01/25/2023   ALT 18 01/25/2023   AST 20 01/25/2023   NA 134 01/25/2023   K 4.7 01/25/2023   CL 96 01/25/2023   CREATININE 0.87 01/25/2023   BUN 13 01/25/2023   CO2 22 01/25/2023   TSH 1.420 01/25/2023   INR 0.99 01/21/2010   HGBA1C 6.4 (H) 01/25/2023   MICROALBUR 30 04/05/2019      Assessment & Plan:  Assessment and Plan       Hypertension complicating diabetes (HCC)  Type 2 diabetes mellitus with polyneuropathy (HCC)  Postoperative hypothyroidism  Mixed hyperlipidemia     No orders of the defined types were placed in this encounter.   No orders of the  defined types were placed in this encounter.    Follow-up: No follow-ups on file.   I,Tylik Treese I Leal-Borjas,acting as a scribe for Blane Ohara, MD.,have documented all relevant documentation on the behalf of Blane Ohara, MD,as directed by  Blane Ohara, MD while in the presence of Blane Ohara, MD.   An After Visit Summary was printed and given to the patient.  Blane Ohara, MD [facility] [phone]

## 2023-05-22 NOTE — Progress Notes (Unsigned)
 This encounter was created in error - please disregard.

## 2023-05-24 ENCOUNTER — Other Ambulatory Visit: Payer: Self-pay

## 2023-05-26 ENCOUNTER — Ambulatory Visit: Admitting: Physician Assistant

## 2023-05-26 ENCOUNTER — Encounter: Payer: Self-pay | Admitting: Physician Assistant

## 2023-05-26 DIAGNOSIS — M6281 Muscle weakness (generalized): Secondary | ICD-10-CM | POA: Diagnosis not present

## 2023-05-26 DIAGNOSIS — I10 Essential (primary) hypertension: Secondary | ICD-10-CM

## 2023-05-26 DIAGNOSIS — E1142 Type 2 diabetes mellitus with diabetic polyneuropathy: Secondary | ICD-10-CM | POA: Diagnosis not present

## 2023-05-26 DIAGNOSIS — M25662 Stiffness of left knee, not elsewhere classified: Secondary | ICD-10-CM | POA: Diagnosis not present

## 2023-05-26 DIAGNOSIS — M25561 Pain in right knee: Secondary | ICD-10-CM | POA: Diagnosis not present

## 2023-05-26 DIAGNOSIS — E1169 Type 2 diabetes mellitus with other specified complication: Secondary | ICD-10-CM | POA: Diagnosis not present

## 2023-05-26 DIAGNOSIS — E785 Hyperlipidemia, unspecified: Secondary | ICD-10-CM

## 2023-05-26 DIAGNOSIS — R140 Abdominal distension (gaseous): Secondary | ICD-10-CM | POA: Diagnosis not present

## 2023-05-26 DIAGNOSIS — K219 Gastro-esophageal reflux disease without esophagitis: Secondary | ICD-10-CM | POA: Diagnosis not present

## 2023-05-26 DIAGNOSIS — M79645 Pain in left finger(s): Secondary | ICD-10-CM | POA: Diagnosis not present

## 2023-05-26 DIAGNOSIS — E890 Postprocedural hypothyroidism: Secondary | ICD-10-CM | POA: Diagnosis not present

## 2023-05-26 DIAGNOSIS — E119 Type 2 diabetes mellitus without complications: Secondary | ICD-10-CM | POA: Diagnosis not present

## 2023-05-26 DIAGNOSIS — M79644 Pain in right finger(s): Secondary | ICD-10-CM

## 2023-05-26 NOTE — Assessment & Plan Note (Signed)
 Controlled Continue taking Nexium 40mg  as prescirbed Will adjust treatment depending on symptoms

## 2023-05-26 NOTE — Assessment & Plan Note (Signed)
 Controlled Denies any changes in symptoms Continue to monitor symptoms Will adjust treatment depending on readings BP Readings from Last 3 Encounters:  04/27/23 122/64  03/26/23 137/65  01/25/23 126/64

## 2023-05-26 NOTE — Assessment & Plan Note (Signed)
 Controlled Labs drawn today Continue to monitor diet and exercise Will adjust treatment depending on labs Lab Results  Component Value Date   HGBA1C 6.4 (H) 01/25/2023   HGBA1C 6.6 (H) 06/05/2022   HGBA1C 6.5 (H) 03/05/2022
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
## 2023-05-26 NOTE — Assessment & Plan Note (Signed)
 Arthritis in fingers causing pain, managed with Tylenol. Aware of liver risks. - Order liver function tests.

## 2023-05-26 NOTE — Patient Instructions (Signed)
 VISIT SUMMARY:  You came in today with ongoing stomach pain and bloating, along with a recent episode of diarrhea. You also mentioned a recent sore throat that has resolved, and arthritis in your fingers that you manage with Tylenol. Additionally, you have a history of hypothyroidism and recently resumed your medication.  YOUR PLAN:  -ABDOMINAL PAIN: Your stomach pain and bloating, along with intermittent diarrhea, may be related to stress or other conditions like constipation or irritable bowel syndrome (IBS). We will conduct blood tests and a urine sample to evaluate your gastrointestinal health. If symptoms persist, we may consider an abdominal x-ray.  -HYPOTHYROIDISM: Hypothyroidism is a condition where your thyroid gland does not produce enough thyroid hormone. You have resumed taking levothyroxine, and we will order blood tests to assess your thyroid function and adjust your medication if needed. Your prescription for levothyroxine has been refilled.  -ARTHRITIS: Arthritis in your fingers is causing pain, which you manage with Tylenol. We are aware of your concerns about liver damage from Tylenol, so we will order liver function tests to monitor your liver health.  -GENERAL HEALTH MAINTENANCE: You have completed the shingles vaccination series and are maintaining regular exercise and hydration. Keep up the good work!  INSTRUCTIONS:  Please follow up for lab results and further evaluation if your symptoms persist. Schedule a follow-up appointment based on your lab results and provide a urine sample for analysis.

## 2023-05-26 NOTE — Assessment & Plan Note (Signed)
 Chronic abdominal pain with intermittent diarrhea, likely related to stress. Differential includes constipation or IBS. - Order blood tests and urine sample for GI evaluation. - Consider abdominal x-ray if symptoms persist.

## 2023-05-26 NOTE — Assessment & Plan Note (Signed)
 On levothyroxine, recently resumed after misplacement. Blood tests to assess thyroid function. - Order thyroid function tests. - Refill levothyroxine prescription.

## 2023-05-27 LAB — COMPREHENSIVE METABOLIC PANEL WITH GFR
ALT: 16 IU/L (ref 0–32)
AST: 19 IU/L (ref 0–40)
Albumin: 4.3 g/dL (ref 3.7–4.7)
Alkaline Phosphatase: 131 IU/L — ABNORMAL HIGH (ref 44–121)
BUN/Creatinine Ratio: 15 (ref 12–28)
BUN: 13 mg/dL (ref 8–27)
Bilirubin Total: 0.4 mg/dL (ref 0.0–1.2)
CO2: 22 mmol/L (ref 20–29)
Calcium: 9.5 mg/dL (ref 8.7–10.3)
Chloride: 97 mmol/L (ref 96–106)
Creatinine, Ser: 0.84 mg/dL (ref 0.57–1.00)
Globulin, Total: 2.7 g/dL (ref 1.5–4.5)
Glucose: 116 mg/dL — ABNORMAL HIGH (ref 70–99)
Potassium: 4.6 mmol/L (ref 3.5–5.2)
Sodium: 134 mmol/L (ref 134–144)
Total Protein: 7 g/dL (ref 6.0–8.5)
eGFR: 69 mL/min/{1.73_m2} (ref >59)

## 2023-05-27 LAB — LIPID PANEL
Chol/HDL Ratio: 2.8 ratio (ref 0.0–4.4)
Cholesterol, Total: 177 mg/dL (ref 100–199)
HDL: 63 mg/dL (ref >39)
LDL Chol Calc (NIH): 91 mg/dL (ref 0–99)
Triglycerides: 130 mg/dL (ref 0–149)
VLDL Cholesterol Cal: 23 mg/dL (ref 5–40)

## 2023-05-27 LAB — MICROALBUMIN / CREATININE URINE RATIO
Creatinine, Urine: 78.3 mg/dL
Microalb/Creat Ratio: 4 mg/g{creat} (ref 0–29)
Microalbumin, Urine: 3.4 ug/mL

## 2023-05-27 LAB — CBC WITH DIFFERENTIAL/PLATELET
Basophils Absolute: 0.1 10*3/uL (ref 0.0–0.2)
Basos: 1 %
EOS (ABSOLUTE): 0.1 10*3/uL (ref 0.0–0.4)
Eos: 1 %
Hematocrit: 38.6 % (ref 34.0–46.6)
Hemoglobin: 13.3 g/dL (ref 11.1–15.9)
Immature Grans (Abs): 0 10*3/uL (ref 0.0–0.1)
Immature Granulocytes: 1 %
Lymphocytes Absolute: 1.6 10*3/uL (ref 0.7–3.1)
Lymphs: 19 %
MCH: 33.4 pg — ABNORMAL HIGH (ref 26.6–33.0)
MCHC: 34.5 g/dL (ref 31.5–35.7)
MCV: 97 fL (ref 79–97)
Monocytes Absolute: 0.7 10*3/uL (ref 0.1–0.9)
Monocytes: 9 %
Neutrophils Absolute: 5.9 10*3/uL (ref 1.4–7.0)
Neutrophils: 69 %
Platelets: 293 10*3/uL (ref 150–450)
RBC: 3.98 x10E6/uL (ref 3.77–5.28)
RDW: 12.7 % (ref 11.7–15.4)
WBC: 8.4 10*3/uL (ref 3.4–10.8)

## 2023-05-27 LAB — TSH: TSH: 2.98 u[IU]/mL (ref 0.450–4.500)

## 2023-05-27 LAB — HEMOGLOBIN A1C
Est. average glucose Bld gHb Est-mCnc: 131 mg/dL
Hgb A1c MFr Bld: 6.2 % — ABNORMAL HIGH (ref 4.8–5.6)

## 2023-06-01 ENCOUNTER — Telehealth: Payer: Self-pay

## 2023-06-01 NOTE — Telephone Encounter (Signed)
 Copied from CRM [id number]. Topic: Clinical - Lab/Test Results >> Jun 01, 2023 11:37 AM Rennis Case wrote: Reason for CRM: Pt given lab results. No further questions.

## 2023-06-07 ENCOUNTER — Ambulatory Visit: Payer: Self-pay | Admitting: Family Medicine

## 2023-06-07 DIAGNOSIS — R109 Unspecified abdominal pain: Secondary | ICD-10-CM | POA: Diagnosis not present

## 2023-06-07 DIAGNOSIS — Z5321 Procedure and treatment not carried out due to patient leaving prior to being seen by health care provider: Secondary | ICD-10-CM | POA: Diagnosis not present

## 2023-06-07 NOTE — Telephone Encounter (Signed)
 Chief Complaint: Nausea since Saturday  Symptoms: Nausea, sore in stomach, swollen in stomach, loose bowel movements Pertinent Negatives: Patient denies vomiting, difficulty breathing, signs of dehydration  Disposition: [x] Urgent Care (no appt availability in office)   Additional Notes: Pt states she has a stomach virus that started Saturday night. Pt states she took some Pepto bismol and it is helping some. This RN advised pt to be seen today but no appt availability in office. Pt states she knows of an urgent care near where she lives. This RN advised to go there today and and have another adult drive. This RN educated pt on  new-worsening symptoms and when to call back/seek emergent care. Pt verbalized understanding and agrees to plan.     Copied from CRM [id number]. Topic: Clinical - Red Word Triage >> Jun 07, 2023 11:25 AM Baldomero Bone wrote: Red Word that prompted transfer to Nurse Triage: bad stomach infection with diarrhea and nausea since Saturday Reason for Disposition  [1] MODERATE-SEVERE SWELLING of abdomen (e.g., looks very distended or swollen) AND [2] NEW-onset or much worse  Answer Assessment - Initial Assessment Questions Chief Complaint: Nausea since Saturday  Symptoms: Nausea, sore in stomach, swollen in stomach, loose bowel movements    Pertinent Negatives: Patient denies vomiting, difficulty breathing, signs of dehydration  Protocols used: Abdomen Bloating and Swelling-A-AH

## 2023-06-07 NOTE — Telephone Encounter (Signed)
 Agree. Dr. Sedalia Muta

## 2023-06-12 ENCOUNTER — Other Ambulatory Visit: Payer: Self-pay | Admitting: Family Medicine

## 2023-06-17 DIAGNOSIS — E119 Type 2 diabetes mellitus without complications: Secondary | ICD-10-CM | POA: Diagnosis not present

## 2023-06-17 DIAGNOSIS — H353131 Nonexudative age-related macular degeneration, bilateral, early dry stage: Secondary | ICD-10-CM | POA: Diagnosis not present

## 2023-06-21 ENCOUNTER — Ambulatory Visit: Payer: Self-pay

## 2023-06-21 NOTE — Telephone Encounter (Signed)
 This RN attempted to continue triage but triage locked. Patient states she has distention in her stomach. Patient rates abdominal pain as a 8/10. Patient states she saw Mirian Ames on April 16th and was told she needs her stomach x-rayed. Please see previous triage notes for today as well. Patient would like further information on suggestions for X-Ray -- she states she called imaging department and they told her she needs orders sent. Please advise to patient @ [phone].    Patient states she called an Imaging place in [place] at hospital [phone]

## 2023-06-21 NOTE — Telephone Encounter (Signed)
2nd attempt made - no answer.

## 2023-06-21 NOTE — Telephone Encounter (Signed)
  Chief Complaint: abdominal pain Symptoms: nausea, abdominal pain, back pain Frequency: x 1 month Pertinent Negatives: Patient denies vomiting,  Disposition: [] ED /[] Urgent Care (no appt availability in office) / [] Appointment(In office/virtual)/ []  [facility]/ [] Home Care/ [] Refused Recommended Disposition /[]  Mobile Bus/ []  Follow-up with PCP Additional Notes: Patient states she has been having constipation since last week and treated with Miralax (she states she was having a BM only once or twice a week). She states yesterday she was able to have 3 bowel movements. She states at night she has been taking Pepto Bismol.   Call dropped during triage. Attempted to call patient back and busy signal.  Copied from CRM [id number]. Topic: Clinical - Red Word Triage >> Jun 21, 2023  8:12 AM Alethia Huxley E wrote: [facility] that prompted transfer to Nurse Triage: Stomach pain. Patient stated she is having stomach pain/issues that have been going on for a few weeks. Pain is making her nauseated and rated the pain a level 8-9 out of 10. Answer Assessment - Initial Assessment Questions 1. LOCATION: "Where does it hurt?"      Upper abdomen.  2. RADIATION: "Does the pain shoot anywhere else?" (e.g., chest, back)     Back.  3. ONSET: "When did the pain begin?" (e.g., minutes, hours or days ago)      X 1 month.  4. SUDDEN: "Gradual or sudden onset?"     Sudden.  5. PATTERN "Does the pain come and go, or is it constant?"    - If it comes and goes: "How long does it last?" "Do you have pain now?"     (Note: Comes and goes means the pain is intermittent. It goes away completely between bouts.)    - If constant: "Is it getting better, staying the same, or getting worse?"      (Note: Constant means the pain never goes away completely; most serious pain is constant and gets worse.)      Constant, states it has been an hour and half this morning.   6. SEVERITY: "How bad is the pain?"   (e.g., Scale 1-10; mild, moderate, or severe)    - MILD (1-3): Doesn't interfere with normal activities, abdomen soft and not tender to touch.     - MODERATE (4-7): Interferes with normal activities or awakens from sleep, abdomen tender to touch.     - SEVERE (8-10): Excruciating pain, doubled over, unable to do any normal activities.       *No Answer*  7. RECURRENT SYMPTOM: "Have you ever had this type of stomach pain before?" If Yes, ask: "When was the last time?" and "What happened that time?"      *No Answer*  8. CAUSE: "What do you think is causing the stomach pain?"     She states she wonders if it is her hernia.   9. RELIEVING/AGGRAVATING FACTORS: "What makes it better or worse?" (e.g., antacids, bending or twisting motion, bowel movement)     *No Answer*  10. OTHER SYMPTOMS: "Do you have any other symptoms?" (e.g., back pain, diarrhea, fever, urination pain, vomiting)       Nausea, constipation (treated with Miralax)  11. PREGNANCY: "Is there any chance you are pregnant?" "When was your last menstrual period?"       N/A.  Protocols used: Abdominal Pain - Female-A-AH

## 2023-06-22 ENCOUNTER — Other Ambulatory Visit: Payer: Self-pay | Admitting: Physician Assistant

## 2023-06-23 NOTE — Telephone Encounter (Signed)
Called patient left message for patient to call office back. 

## 2023-06-28 ENCOUNTER — Telehealth: Payer: Self-pay

## 2023-06-28 ENCOUNTER — Ambulatory Visit: Payer: Self-pay

## 2023-06-28 DIAGNOSIS — R1031 Right lower quadrant pain: Secondary | ICD-10-CM | POA: Diagnosis not present

## 2023-06-28 DIAGNOSIS — R109 Unspecified abdominal pain: Secondary | ICD-10-CM | POA: Diagnosis not present

## 2023-06-28 DIAGNOSIS — I10 Essential (primary) hypertension: Secondary | ICD-10-CM | POA: Diagnosis not present

## 2023-06-28 NOTE — Telephone Encounter (Signed)
 Agree. Dr. Sedalia Muta

## 2023-06-28 NOTE — Telephone Encounter (Signed)
 Copied from CRM [id number]. Topic: Clinical - Request for Lab/Test Order >> Jun 28, 2023 10:19 AM Amy Mccann wrote: Reason for CRM: Patient waiting to hear about referral for CT scan of abdomen- [phone]

## 2023-06-28 NOTE — Telephone Encounter (Signed)
 Copied from CRM [id number]. Topic: Clinical - Red Word Triage >> Jun 28, 2023 10:20 AM Star East wrote: Red Word that prompted transfer to Nurse Triage: abdominal pain level 8  Chief Complaint: abd pain, 8/10 Symptoms: pain Frequency: constant Pertinent Negatives: Patient denies diarrhea, n/v Disposition: [x] ED /[] Urgent Care (no appt availability in office) / [] Appointment(In office/virtual)/ []  [place] [facility]/ [] Home Care/ [] Refused Recommended Disposition /[] [facility]/ []  Follow-up with PCP Additional Notes: instructed to go to ER.  This nurse called 911. Pcp office updated.  At some point during the call to 911, patient dropped phone, nurse can hear patient breathing heavy and moaning.  Continuing to wait for EMS.  Patient disconnected phone, will call back. No answer.  Reason for Disposition  [1] SEVERE pain (e.g., excruciating) AND [2] present > 1 hour  Answer Assessment - Initial Assessment Questions 1. LOCATION: "Where does it hurt?"      Abdominal pain in middle 2. RADIATION: "Does the pain shoot anywhere else?" (e.g., chest, back)     no 3. ONSET: "When did the pain begin?" (e.g., minutes, hours or days ago)      This am 4. SUDDEN: "Gradual or sudden onset?"     suddenly 5. PATTERN "Does the pain come and go, or is it constant?"    - If it comes and goes: "How long does it last?" "Do you have pain now?"     (Note: Comes and goes means the pain is intermittent. It goes away completely between bouts.)    - If constant: "Is it getting better, staying the same, or getting worse?"      (Note: Constant means the pain never goes away completely; most serious pain is constant and gets worse.)      constant 6. SEVERITY: "How bad is the pain?"  (e.g., Scale 1-10; mild, moderate, or severe)    - MILD (1-3): Doesn't interfere with normal activities, abdomen soft and not tender to touch.     - MODERATE (4-7): Interferes with normal activities or awakens from sleep,  abdomen tender to touch.     - SEVERE (8-10): Excruciating pain, doubled over, unable to do any normal activities.       8/10 7. RECURRENT SYMPTOM: "Have you ever had this type of stomach pain before?" If Yes, ask: "When was the last time?" and "What happened that time?"      Yes, 2015 8. CAUSE: "What do you think is causing the stomach pain?"     hernia 9. RELIEVING/AGGRAVATING FACTORS: "What makes it better or worse?" (e.g., antacids, bending or twisting motion, bowel movement)     Pepto, did not help 10. OTHER SYMPTOMS: "Do you have any other symptoms?" (e.g., back pain, diarrhea, fever, urination pain, vomiting)       no 11. PREGNANCY: "Is there any chance you are pregnant?" "When was your last menstrual period?"       na  Protocols used: Abdominal Pain - [facility]

## 2023-07-02 NOTE — Telephone Encounter (Signed)
 Pt called back requesting to speak to the clinic, she has questions.

## 2023-07-06 ENCOUNTER — Telehealth: Payer: Self-pay

## 2023-07-06 NOTE — Telephone Encounter (Signed)
 Left message for patient to call our office.

## 2023-07-06 NOTE — Telephone Encounter (Signed)
 Patient called in today to let us  know she is still hurting on the left side. Pain is 7/10. Patient states it has eased off.   Patient states she has only had yogurt, 3 pieces of bacon, and a boiled egg. Patient states she is eating light. Patient states she had a little bit of BBQ yesterday with slaw. Patient states she was hurting before she ate the BBQ and slaw yesterday as well.   Spoke with Dr. Reinhold Carbine, she stated for patient to come in a 1540 today to see Delford Felling, NP and if not to go to [facility] or ED.   Delford Felling, does not have 1540 as she has hit her limit for the day. Advised patient to go UC or ED. She stated it has eased off but not fully gone. Patient does not want to go the ED and asked could we just put her on the schedule for tomorrow.  I told patient multiple times we recommend the ED and made her appointment for tomorrow, and stated if pain gets worse to go to ED immediately.

## 2023-07-07 ENCOUNTER — Ambulatory Visit (INDEPENDENT_AMBULATORY_CARE_PROVIDER_SITE_OTHER): Admitting: Family Medicine

## 2023-07-07 ENCOUNTER — Encounter: Payer: Self-pay | Admitting: Family Medicine

## 2023-07-07 VITALS — BP 112/64 | HR 83 | Temp 97.5°F | Ht 59.0 in | Wt 155.4 lb

## 2023-07-07 DIAGNOSIS — K5790 Diverticulosis of intestine, part unspecified, without perforation or abscess without bleeding: Secondary | ICD-10-CM | POA: Diagnosis not present

## 2023-07-07 MED ORDER — LINACLOTIDE 72 MCG PO CAPS: 72.0000 ug | ORAL_CAPSULE | Freq: Every day | ORAL | Status: DC

## 2023-07-07 NOTE — Patient Instructions (Signed)
  During a flare-up: Start with a clear liquid diet to allow your bowels to rest.   Post-flare-up: Gradually reintroduce low-fiber solid foods, such as white bread and low-fiber cereals, before transitioning to a high-fiber diet for prevention.   Foods to include: Focus on fruits, vegetables, whole grains, and legumes, which can help reduce the risk of diverticulitis.   Foods to avoid: Limit red and processed meats, as they may increase the risk of developing diverticulitis.   Nuts and seeds: These can be included if ground or pureed, especially after recovery.

## 2023-07-07 NOTE — Assessment & Plan Note (Signed)
 Acute Diverticulitis flare Experiencing a flare-up with significant pain (7/10) and constipation. - Advise clear liquid diet during flare-up for bowel rest. - Recommend avoiding high fiber foods, nuts, seeds, and processed meats. - Instruct to take Miralax and Metamucil daily for bowel movements. - Provide sample of Linzess  to try before breakfast for bowel movements. - Advise to avoid solid foods and maintain clear liquids until symptoms improve. - Discussed importance of dietary modifications to prevent future flare-ups. - Instruct to take Zofran  for nausea as needed, using half a tablet as directed.

## 2023-07-07 NOTE — Progress Notes (Signed)
 Subjective:  Patient ID: Amy Mccann, female    DOB: 10-22-1940  Age: 83 y.o. MRN: [id number]  Chief Complaint  Patient presents with   Abdominal Pain    left    Discussed the use of AI scribe software for clinical note transcription with the patient, who gave verbal consent to proceed.  History of Present Illness   Amy Mccann is an 83 year old female with diverticulitis who presents with left-sided abdominal pain and constipation. She is accompanied by her neighbor who drove her to the appointment.  She has been experiencing significant left-sided abdominal pain, which led to a visit to the emergency department on May 19th. A CT scan confirmed a diagnosis of diverticulitis. The pain is persistent, and she notes abdominal swelling, although it has decreased slightly in size recently.  She experiences nausea but has not vomited. She was prescribed Zofran  for nausea, which she takes at a higher dose of 8 mg instead of the prescribed 4 mg, as the lower dose was ineffective. She allows it to dissolve on her tongue.  She struggles with constipation and uses Miralax daily, adding a small amount to her coffee instead of the full recommended dose. She also mentions using Metamucil but has not been consistent with it recently. Her bowel movements have been infrequent, with no movement on the previous day despite consuming light meals like soup. Her weight has fluctuated slightly, from 155 to 158 pounds.  She reports she is supposed to be on a bland diet, primarily consuming soup and BBQ, and has been cautious about eating nuts and seeds. She mentions a recent flare-up of her symptoms, which has caused increased pain on her left side.          07/07/2023    7:36 AM 03/29/2023    1:48 PM 09/25/2022    9:52 AM 03/05/2022   10:19 AM 03/05/2022    9:20 AM  Depression screen PHQ 2/9  Decreased Interest 0 0 0 0 1  Down, Depressed, Hopeless 0 1 0 0 0  PHQ - 2 Score 0 1 0 0 1  Altered sleeping   0  0   Tired, decreased energy  1  0   Change in appetite  0  0   Feeling bad or failure about yourself   0  0   Trouble concentrating  0  0   Moving slowly or fidgety/restless  0  0   Suicidal thoughts  0  0   PHQ-9 Score  2  0   Difficult doing work/chores  Not difficult at all           07/07/2023    7:36 AM  Fall Risk   Falls in the past year? 0  Number falls in past yr: 0  Injury with Fall? 0  Risk for fall due to : No Fall Risks    Patient Care Team: Mercy Stall, MD as PCP - General (Family Medicine) Joaquin Mulberry, MD as Consulting Physician (Neurosurgery) Misenheimer, Emeterio Hansen, MD as Consulting Physician (Unknown Physician Specialty) Nolia Baumgartner, MD as Consulting Physician (Oncology) Bayard Limbo, OD (Optometry)   Review of Systems  Constitutional:  Negative for chills, diaphoresis, fatigue and fever.  HENT:  Negative for congestion, ear pain and sinus pain.   Respiratory:  Negative for cough and shortness of breath.   Cardiovascular:  Negative for chest pain.  Gastrointestinal:  Positive for abdominal pain (LLQ), constipation and nausea. Negative for vomiting.  Genitourinary:  Negative for dysuria.  Musculoskeletal:  Negative for arthralgias.  Neurological:  Negative for weakness and headaches.  Psychiatric/Behavioral:  Negative for dysphoric mood. The patient is not nervous/anxious.     Current Outpatient Medications on File Prior to Visit  Medication Sig Dispense Refill   Accu-Chek Softclix Lancets lancets USE AS DIRECTED 100 each 5   acetaminophen (TYLENOL) 500 MG tablet Take 500 mg by mouth every 6 (six) hours as needed.     CALCIUM  PO Take 600 mg by mouth daily.     Cholecalciferol (VITAMIN D3 PO) Take 1,200 mg by mouth daily.     esomeprazole  (NEXIUM ) 40 MG capsule TAKE 1 CAPSULE(40 MG) BY MOUTH TWICE DAILY BEFORE A MEAL 180 capsule 3   glucose blood (ACCU-CHEK AVIVA PLUS) test strip USE TO CHECK FASTING BLOOD SUGAR ONCE DAILY AS DIRECTED 100  strip 3   levothyroxine  (SYNTHROID ) 112 MCG tablet TAKE 1 TABLET(112 MCG) BY MOUTH DAILY BEFORE BREAKFAST 90 tablet 1   loratadine  (CLARITIN ) 10 MG tablet Take 1 tablet (10 mg total) by mouth daily. 30 tablet 2   losartan (COZAAR) 25 MG tablet TAKE 1 TABLET BY MOUTH DAILY 90 tablet 1   Omega-3 Fatty Acids (FISH OIL) 1000 MG CAPS Take 1,000 mg by mouth daily.     ondansetron  (ZOFRAN -ODT) 8 MG disintegrating tablet DISSOLVE 1/2 TABLET(4 MG) ON THE TONGUE EVERY 8 HOURS AS NEEDED FOR NAUSEA OR VOMITING 30 tablet 1   psyllium (METAMUCIL) 58.6 % packet Take by mouth.     rosuvastatin  (CRESTOR ) 5 MG tablet TAKE 1 TABLET(5 MG) BY MOUTH DAILY 90 tablet 1   No current facility-administered medications on file prior to visit.   Past Medical History:  Diagnosis Date   Age-related osteoporosis without current pathological fracture 07/03/2019   Cancer [facility]) 1990   Left Breast Cancer    Cancer (HCC) 2017   Thyroid  Cancer   Cervicalgia 07/03/2019   Diabetes mellitus without complication (HCC)    Generalized anxiety disorder    GERD (gastroesophageal reflux disease)    Hypertension    Low back pain with sciatica 07/03/2019   Obesity due to excess calories 07/03/2019   S/P repair of paraesophageal hernia 10/20/2013   S/P TKR (total knee replacement) 07/25/2012   Past Surgical History:  Procedure Laterality Date   24 HOUR PH STUDY N/A 08/07/2013   Procedure: 24 HOUR PH STUDY;  Surgeon: Barbie Boon, MD;  Location: [facility];  Service: Endoscopy;  Laterality: N/A;   APPENDECTOMY  03/17/2016   ESOPHAGEAL MANOMETRY N/A 08/07/2013   Procedure: ESOPHAGEAL MANOMETRY (EM);  Surgeon: Barbie Boon, MD;  Location: [facility];  Service: Endoscopy;  Laterality: N/A;   HERNIA SURGERY  2015   HIATAL HERNIA SURGERY (FUNDOPLICATION.)   JOINT REPLACEMENT Right    right knee   MASTECTOMY Left 1990   THYROIDECTOMY Bilateral 08/2015    Family History  Problem Relation Age of Onset   Leukemia Brother    Social  History   Socioeconomic History   Marital status: Divorced    Spouse name: Not on file   Number of children: 0   Years of education: Not on file   Highest education level: Not on file  Occupational History   Occupation: Retired from [employer]  Tobacco Use   Smoking status: Never    Passive exposure: Never   Smokeless tobacco: Never  Vaping Use   Vaping status: Never Used  Substance and Sexual Activity   Alcohol use: Never   Drug  use: Never   Sexual activity: Not Currently  Other Topics Concern   Not on file  Social History Narrative   One sister lives near by, the other sister lives in [place]    Social Drivers of Health   Financial Resource Strain: Low Risk  (03/29/2023)   Overall Financial Resource Strain (CARDIA)    Difficulty of Paying Living Expenses: Not hard at all  Food Insecurity: No Food Insecurity (03/29/2023)   Hunger Vital Sign    Worried About Running Out of Food in the Last Year: Never true    Ran Out of Food in the Last Year: Never true  Transportation Needs: No Transportation Needs (03/29/2023)   PRAPARE - Administrator, Civil Service (Medical): No    Lack of Transportation (Non-Medical): No  Physical Activity: Insufficiently Active (03/29/2023)   Exercise Vital Sign    Days of Exercise per Week: 7 days    Minutes of Exercise per Session: 20 min  Stress: No Stress Concern Present (03/29/2023)   [employer] of Occupational Health - Occupational Stress Questionnaire    Feeling of Stress : Not at all  Social Connections: Moderately Integrated (03/29/2023)   Social Connection and Isolation Panel [NHANES]    Frequency of Communication with Friends and Family: More than three times a week    Frequency of Social Gatherings with Friends and Family: More than three times a week    Attends Religious Services: More than 4 times per year    Active Member of [employer] or Organizations: No    Attends Engineer, structural: More than 4 times per year     Marital Status: Divorced    Objective:  BP 112/64   Pulse 83   Temp (!) 97.5 F (36.4 C)   Ht 4\' 11"  (1.499 m)   Wt 155 lb 6.4 oz (70.5 kg)   SpO2 (!) 87%   BMI 31.39 kg/m      07/07/2023    7:32 AM 04/27/2023   10:15 AM 03/29/2023    1:42 PM  BP/Weight  Systolic BP 112 122   Diastolic BP 64 64   Wt. (Lbs) 155.4 158 158  BMI 31.39 kg/m2 31.91 kg/m2 31.91 kg/m2    Physical Exam Vitals reviewed.  Constitutional:      General: She is not in acute distress.    Appearance: Normal appearance. She is not ill-appearing.  Neck:     Vascular: No carotid bruit.  Cardiovascular:     Rate and Rhythm: Normal rate and regular rhythm.     Heart sounds: Normal heart sounds. No murmur heard. Pulmonary:     Effort: Pulmonary effort is normal.     Breath sounds: Normal breath sounds. No wheezing.  Abdominal:     General: Bowel sounds are increased. There is distension.     Palpations: Abdomen is soft.     Tenderness: There is abdominal tenderness in the left lower quadrant.  Skin:    General: Skin is warm.  Neurological:     Mental Status: She is alert. Mental status is at baseline.  Psychiatric:        Mood and Affect: Mood normal.        Behavior: Behavior normal.     Lab Results  Component Value Date   WBC 8.4 05/26/2023   HGB 13.3 05/26/2023   HCT 38.6 05/26/2023   PLT 293 05/26/2023   GLUCOSE 116 (H) 05/26/2023   CHOL 177 05/26/2023   TRIG 130 05/26/2023  HDL 63 05/26/2023   LDLCALC 91 05/26/2023   ALT 16 05/26/2023   AST 19 05/26/2023   NA 134 05/26/2023   K 4.6 05/26/2023   CL 97 05/26/2023   CREATININE 0.84 05/26/2023   BUN 13 05/26/2023   CO2 22 05/26/2023   TSH 2.980 05/26/2023   INR 0.99 01/21/2010   HGBA1C 6.2 (H) 05/26/2023   MICROALBUR 30 04/05/2019      Assessment & Plan:  Diverticulosis Assessment & Plan: Acute Diverticulitis flare Experiencing a flare-up with significant pain (7/10) and constipation. - Advise clear liquid diet during  flare-up for bowel rest. - Recommend avoiding high fiber foods, nuts, seeds, and processed meats. - Instruct to take Miralax and Metamucil daily for bowel movements. - Provide sample of Linzess to try before breakfast for bowel movements. - Advise to avoid solid foods and maintain clear liquids until symptoms improve. - Discussed importance of dietary modifications to prevent future flare-ups. - Instruct to take Zofran  for nausea as needed, using half a tablet as directed.   Orders: -     linaCLOtide; Take 1 capsule (72 mcg total) by mouth daily before breakfast.      Follow-up: Return if symptoms worsen or fail to improve.    An After Visit Summary was printed and given to the patient. Delford Felling, FNP [facility] [phone]

## 2023-08-02 ENCOUNTER — Telehealth: Payer: Self-pay

## 2023-08-02 ENCOUNTER — Other Ambulatory Visit: Payer: Self-pay | Admitting: Family Medicine

## 2023-08-02 DIAGNOSIS — R140 Abdominal distension (gaseous): Secondary | ICD-10-CM

## 2023-08-02 DIAGNOSIS — K219 Gastro-esophageal reflux disease without esophagitis: Secondary | ICD-10-CM

## 2023-08-02 DIAGNOSIS — K449 Diaphragmatic hernia without obstruction or gangrene: Secondary | ICD-10-CM

## 2023-08-02 NOTE — Telephone Encounter (Signed)
 Copied from CRM [id number]. Topic: Referral - Question >> Aug 02, 2023  9:27 AM Revonda D wrote: Reason for CRM: Pt stated that she needs a referral sent to Misenheimer, Evalene, MD for stomach issues. Pt would like to Dr.Cox to send the referral today is possible and for the office to give her a callback today after 2pm.

## 2023-08-03 NOTE — Telephone Encounter (Signed)
 Patient was informed.

## 2023-08-04 ENCOUNTER — Other Ambulatory Visit: Payer: Self-pay | Admitting: Family Medicine

## 2023-08-04 DIAGNOSIS — E1169 Type 2 diabetes mellitus with other specified complication: Secondary | ICD-10-CM

## 2023-08-11 ENCOUNTER — Encounter (HOSPITAL_BASED_OUTPATIENT_CLINIC_OR_DEPARTMENT_OTHER): Payer: Self-pay

## 2023-08-18 ENCOUNTER — Ambulatory Visit: Payer: Self-pay

## 2023-08-18 DIAGNOSIS — R1084 Generalized abdominal pain: Secondary | ICD-10-CM | POA: Diagnosis not present

## 2023-08-18 DIAGNOSIS — R109 Unspecified abdominal pain: Secondary | ICD-10-CM | POA: Diagnosis not present

## 2023-08-18 DIAGNOSIS — G8929 Other chronic pain: Secondary | ICD-10-CM | POA: Diagnosis not present

## 2023-08-18 DIAGNOSIS — K449 Diaphragmatic hernia without obstruction or gangrene: Secondary | ICD-10-CM | POA: Diagnosis not present

## 2023-08-18 DIAGNOSIS — R110 Nausea: Secondary | ICD-10-CM | POA: Diagnosis not present

## 2023-08-18 NOTE — Telephone Encounter (Signed)
 Reviewed. Dr Sedalia Muta

## 2023-08-18 NOTE — Telephone Encounter (Signed)
 FYI Only or Action Required?: FYI only for provider.  Patient was last seen in primary care on 07/07/2023 by Teressa Harrie HERO, FNP.  Called Nurse Triage reporting Nausea./severe abd pain, abd swelling, back pain, dry mouth  Symptoms began several days ago.  Interventions attempted: Rest, hydration, or home remedies.  Symptoms are: gradually worsening.  Triage Disposition: Go to ED Now (Notify PCP)  Patient/caregiver understands and will follow disposition?: Yes    Initial Assessment Questions 1. NAUSEA SEVERITY: How bad is the nausea? (e.g., mild, moderate, severe; dehydration, weight loss) - MILD: Loss of appetite without change in eating habits. - MODERATE: Decreased oral intake without significant weight loss, dehydration, or malnutrition. - SEVERE: Inadequate caloric or fluid intake, significant weight loss, symptoms of dehydration.  Dry mouth mild - has been eating and drinking  2. ONSET: When did the nausea begin?  Saturday   3. VOMITING: Any vomiting? If Yes, ask: How many times today?  No   4. RECURRENT SYMPTOM: Have you had nausea before? If Yes, ask: When was the last time? What happened that time?  Diverticulitis, abd swelling, abd pain, back pain    5. CAUSE: What do you think is causing the nausea?  Diverticulitis, abd pain (8/10)                 Copied from CRM [id number]. Topic: Clinical - Red Word Triage >> Aug 18, 2023 10:54 AM Elle L wrote: Red Word that prompted transfer to Nurse Triage: The patient states she has been experiencing extreme nausea since Saturday. The patient states she has been experiencing back pain as well and her stomach is swollen. Reason for Disposition  [1] SEVERE pain (e.g., excruciating) AND [2] present > 1 hour  Protocols used: Abdominal Pain - Female-A-AH

## 2023-08-23 ENCOUNTER — Ambulatory Visit: Payer: Self-pay

## 2023-08-23 NOTE — Telephone Encounter (Signed)
 FYI Only or Action Required?: FYI only for provider.  Patient was last seen in primary care on 07/07/2023 by Teressa Harrie HERO, FNP.  Called Nurse Triage reporting Abdominal Pain.  Symptoms began several weeks ago.  Interventions attempted: OTC medications: Pepto.  Symptoms are: unchanged.  Triage Disposition: See Physician Within 24 Hours (overriding See HCP Within 4 Hours (Or PCP Triage))  Patient/caregiver understands and will follow disposition?: Yes                             Copied from CRM [id number]. Topic: Clinical - Red Word Triage >> Aug 23, 2023 11:06 AM Powell HERO wrote: Red Word that prompted transfer to Nurse Triage: Patient is still having pain and thinks its in relation to possibly her gallbladder, has been taking promethazine  25mg  from ER. States that she is only able to eat soup and yogurt. Reason for Disposition  [1] MILD-MODERATE pain AND [2] constant AND [3] age > 60 years  Answer Assessment - Initial Assessment Questions 1. LOCATION: Where does it hurt?      Across stomach, left side hurts more 2. RADIATION: Does the pain shoot anywhere else? (e.g., chest, back)     Pain stays localized to stomach  3. ONSET: When did the pain begin? (e.g., minutes, hours or days ago)      3-4 weeks 4. SUDDEN: Gradual or sudden onset?     Sudden  5. PATTERN Does the pain come and go, or is it constant?     Constant  6. SEVERITY: How bad is the pain?  (e.g., Scale 1-10; mild, moderate, or severe)     Rates pain an 8 7. RECURRENT SYMPTOM: Have you ever had this type of stomach pain before? If Yes, ask: When was the last time? and What happened that time?      Denies 8. CAUSE: What do you think is causing the stomach pain? (e.g., gallstones, recent abdominal surgery)     Hiatal hernia or gallbladder  9. RELIEVING/AGGRAVATING FACTORS: What makes it better or worse? (e.g., antacids, bending or twisting motion, bowel movement)      Denies fever 10. OTHER SYMPTOMS: Do you have any other symptoms? (e.g., back pain, diarrhea, fever, urination pain, vomiting)     Nausea, loose stools, bloating, denies vomiting     States she went to ED on Wednesday of last week and was advised she has a hiatal hernia returning. States symptoms are the same as when she went to ED, denies worsening symptoms. Scheduled soonest HFU, due to patient's pain level.  Protocols used: Abdominal Pain - Female-A-AH

## 2023-08-24 ENCOUNTER — Ambulatory Visit (INDEPENDENT_AMBULATORY_CARE_PROVIDER_SITE_OTHER): Admitting: Family Medicine

## 2023-08-24 ENCOUNTER — Encounter: Payer: Self-pay | Admitting: Family Medicine

## 2023-08-24 VITALS — BP 110/60 | HR 78 | Temp 97.7°F | Resp 18 | Ht 59.0 in | Wt 158.0 lb

## 2023-08-24 DIAGNOSIS — K5790 Diverticulosis of intestine, part unspecified, without perforation or abscess without bleeding: Secondary | ICD-10-CM

## 2023-08-24 DIAGNOSIS — K449 Diaphragmatic hernia without obstruction or gangrene: Secondary | ICD-10-CM

## 2023-08-24 DIAGNOSIS — K5904 Chronic idiopathic constipation: Secondary | ICD-10-CM

## 2023-08-24 DIAGNOSIS — K219 Gastro-esophageal reflux disease without esophagitis: Secondary | ICD-10-CM | POA: Insufficient documentation

## 2023-08-24 DIAGNOSIS — R1084 Generalized abdominal pain: Secondary | ICD-10-CM

## 2023-08-24 MED ORDER — VOQUEZNA 10 MG PO TABS: 10.0000 mg | ORAL_TABLET | Freq: Every day | ORAL | Status: DC

## 2023-08-24 NOTE — Assessment & Plan Note (Signed)
 Hiatal hernia likely causing nausea, abdominal discomfort, bloating, and pain. Endoscopy scheduled with Dr. Nicolina. Discussed dietary and lifestyle modifications. - Provide Voquezna  samples to replace Nexium  and evaluate effectiveness. - Follow up in two weeks to assess Voquezna  effectiveness. - Avoid caffeine, spicy foods, and acidic foods. - Elevate head of bed 6-8 inches to reduce reflux. - Avoid lying down or bending over for 2-3 hours post-meal.

## 2023-08-24 NOTE — Assessment & Plan Note (Signed)
 Differentials include Hiatal hernia, reflux, nausea, food choices, or dental issues with decaying teeth and partial dentures being stuck. - Appointment 8.27.25 with GI  - Dental appointment later today to assess  - Continue nausea medication prescribed by the hospital as needed (Zofran  ineffective) phenergan  25 mg

## 2023-08-24 NOTE — Assessment & Plan Note (Signed)
 Well controlled Continue miralax every morning if no bowel movement repeat in the evening and at bedtime if needed. - Continue fiber as directed per Dr. Larene.   - Continue Linzess  72 mg sample given and effective, will discuss with specialist - GI appointment scheduled with GI on August 27th

## 2023-08-24 NOTE — Progress Notes (Signed)
 Subjective:  Patient ID: Amy Mccann, female    DOB: 1941/01/12  Age: 83 y.o. MRN: [id number]  Chief Complaint  Patient presents with   Hospitalization Follow-up    Discussed the use of AI scribe software for clinical note transcription with the patient, who gave verbal consent to proceed.  History of Present Illness   The patient is an 83 year old with diverticulosis and a hiatal hernia who presents with abdominal pain and nausea.  They have been experiencing recurrent abdominal pain and nausea, attributed to their history of diverticulosis and a hiatal hernia. They were recently hospitalized for diverticulosis and have persistent discomfort. The pain is described as 'all the way across' their abdomen with a sensation of bloating.  They have a history of a hiatal hernia, surgically repaired in 2015. Nausea has been present for the past two to three years, suspected to be related to the hernia. They currently have an appointment with Dr. Larene on August 27 th and on the wait list for any cancellations before then.  Dietary modifications include avoiding nuts, seeds, caffeine, alcohol, chocolate, spicy foods, and tomato-based products. They occasionally consume these foods, such as strawberry shortcake, which they believe worsened their symptoms. They try to adhere to dietary recommendations, including eating bland foods like yogurt, deviled eggs, and cream of chicken soup.  Current medications include Nexium  (esomeprazole ) 40 mg, Zofran  (ondansetron ), and promethazine  for nausea, along with a thyroid  medication and a diuretic. The nausea medications have not been very effective.  They have dental issues, including decaying teeth and difficulty removing partial dentures, which they suspect may contribute to their nausea. They plan to visit the dentist to address these concerns.  No heartburn but they experience bloating. They have difficulty with bowel movements and occasionally use  Miralax, Linzess  and stool softeners. They have a history of constipation.          07/07/2023    7:36 AM 03/29/2023    1:48 PM 09/25/2022    9:52 AM 03/05/2022   10:19 AM 03/05/2022    9:20 AM  Depression screen PHQ 2/9  Decreased Interest 0 0 0 0 1  Down, Depressed, Hopeless 0 1 0 0 0  PHQ - 2 Score 0 1 0 0 1  Altered sleeping  0  0   Tired, decreased energy  1  0   Change in appetite  0  0   Feeling bad or failure about yourself   0  0   Trouble concentrating  0  0   Moving slowly or fidgety/restless  0  0   Suicidal thoughts  0  0   PHQ-9 Score  2  0   Difficult doing work/chores  Not difficult at all           07/07/2023    7:36 AM  Fall Risk   Falls in the past year? 0  Number falls in past yr: 0  Injury with Fall? 0  Risk for fall due to : No Fall Risks    Patient Care Team: Sherre Clapper, MD as PCP - General (Family Medicine) Joshua Alm Hamilton, MD as Consulting Physician (Neurosurgery) Misenheimer, Evalene, MD as Consulting Physician (Unknown Physician Specialty) Cornelius Crystalyn DEL, MD as Consulting Physician (Oncology) Laurice Grice, OD (Optometry)   Review of Systems  Constitutional:  Negative for chills, diaphoresis, fatigue and fever.  HENT:  Negative for congestion, ear pain and sinus pain.   Eyes: Negative.   Respiratory:  Negative for cough and  shortness of breath.   Cardiovascular:  Negative for chest pain.  Gastrointestinal:  Positive for abdominal pain and nausea. Negative for constipation, diarrhea and vomiting.  Endocrine: Negative.   Genitourinary:  Negative for dysuria.  Musculoskeletal:  Negative for arthralgias.  Allergic/Immunologic: Negative.   Neurological:  Negative for weakness and headaches.  Hematological: Negative.   Psychiatric/Behavioral:  Negative for dysphoric mood. The patient is not nervous/anxious.     Current Outpatient Medications on File Prior to Visit  Medication Sig Dispense Refill   ACCU-CHEK AVIVA PLUS test strip  USE TO CHECK FASTING BLOOD SUGAR ONCE DAILY AS DIRECTED 100 strip 3   Accu-Chek Softclix Lancets lancets USE AS DIRECTED 100 each 5   acetaminophen (TYLENOL) 500 MG tablet Take 500 mg by mouth every 6 (six) hours as needed.     CALCIUM  PO Take 600 mg by mouth daily.     Cholecalciferol (VITAMIN D3 PO) Take 1,200 mg by mouth daily.     esomeprazole  (NEXIUM ) 40 MG capsule TAKE 1 CAPSULE(40 MG) BY MOUTH TWICE DAILY BEFORE A MEAL 180 capsule 3   levothyroxine  (SYNTHROID ) 112 MCG tablet TAKE 1 TABLET(112 MCG) BY MOUTH DAILY BEFORE BREAKFAST 90 tablet 1   linaclotide  (LINZESS ) 72 MCG capsule Take 1 capsule (72 mcg total) by mouth daily before breakfast.     losartan (COZAAR) 25 MG tablet TAKE 1 TABLET BY MOUTH DAILY 90 tablet 1   Omega-3 Fatty Acids (FISH OIL) 1000 MG CAPS Take 1,000 mg by mouth daily. (Patient taking differently: Take 1,000 mg by mouth every other day.)     ondansetron  (ZOFRAN -ODT) 8 MG disintegrating tablet DISSOLVE 1/2 TABLET(4 MG) ON THE TONGUE EVERY 8 HOURS AS NEEDED FOR NAUSEA OR VOMITING 30 tablet 1   promethazine  (PHENERGAN ) 25 MG tablet Take 25 mg by mouth every 8 (eight) hours as needed.     psyllium (METAMUCIL) 58.6 % packet Take by mouth.     rosuvastatin  (CRESTOR ) 5 MG tablet TAKE 1 TABLET(5 MG) BY MOUTH DAILY 90 tablet 1   loratadine  (CLARITIN ) 10 MG tablet Take 1 tablet (10 mg total) by mouth daily. (Patient not taking: Reported on 08/24/2023) 30 tablet 2   No current facility-administered medications on file prior to visit.   Past Medical History:  Diagnosis Date   Age-related osteoporosis without current pathological fracture 07/03/2019   Cancer [facility]) 1990   Left Breast Cancer    Cancer (HCC) 2017   Thyroid  Cancer   Cervicalgia 07/03/2019   Diabetes mellitus without complication (HCC)    Generalized anxiety disorder    GERD (gastroesophageal reflux disease)    Hypertension    Low back pain with sciatica 07/03/2019   Obesity due to excess calories 07/03/2019    S/P repair of paraesophageal hernia 10/20/2013   S/P TKR (total knee replacement) 07/25/2012   Past Surgical History:  Procedure Laterality Date   24 HOUR PH STUDY N/A 08/07/2013   Procedure: 24 HOUR PH STUDY;  Surgeon: Norleen JAYSON Hint, MD;  Location: [facility] ENDOSCOPY;  Service: Endoscopy;  Laterality: N/A;   APPENDECTOMY  03/17/2016   ESOPHAGEAL MANOMETRY N/A 08/07/2013   Procedure: ESOPHAGEAL MANOMETRY (EM);  Surgeon: Norleen JAYSON Hint, MD;  Location: [facility] ENDOSCOPY;  Service: Endoscopy;  Laterality: N/A;   HERNIA SURGERY  2015   HIATAL HERNIA SURGERY (FUNDOPLICATION.)   JOINT REPLACEMENT Right    right knee   MASTECTOMY Left 1990   THYROIDECTOMY Bilateral 08/2015    Family History  Problem Relation Age of Onset  Leukemia Brother    Social History   Socioeconomic History   Marital status: Divorced    Spouse name: Not on file   Number of children: 0   Years of education: Not on file   Highest education level: Not on file  Occupational History   Occupation: Retired from [employer]  Tobacco Use   Smoking status: Never    Passive exposure: Never   Smokeless tobacco: Never  Vaping Use   Vaping status: Never Used  Substance and Sexual Activity   Alcohol use: Never   Drug use: Never   Sexual activity: Not Currently  Other Topics Concern   Not on file  Social History Narrative   One sister lives near by, the other sister lives in [place]    Social Drivers of Health   Financial Resource Strain: Low Risk  (03/29/2023)   Overall Financial Resource Strain (CARDIA)    Difficulty of Paying Living Expenses: Not hard at all  Food Insecurity: No Food Insecurity (03/29/2023)   Hunger Vital Sign    Worried About Running Out of Food in the Last Year: Never true    Ran Out of Food in the Last Year: Never true  Transportation Needs: No Transportation Needs (03/29/2023)   PRAPARE - Administrator, Civil Service (Medical): No    Lack of Transportation (Non-Medical): No  Physical Activity:  Insufficiently Active (03/29/2023)   Exercise Vital Sign    Days of Exercise per Week: 7 days    Minutes of Exercise per Session: 20 min  Stress: No Stress Concern Present (03/29/2023)   [employer] of Occupational Health - Occupational Stress Questionnaire    Feeling of Stress : Not at all  Social Connections: Moderately Integrated (03/29/2023)   Social Connection and Isolation Panel    Frequency of Communication with Friends and Family: More than three times a week    Frequency of Social Gatherings with Friends and Family: More than three times a week    Attends Religious Services: More than 4 times per year    Active Member of [employer] or Organizations: No    Attends Engineer, structural: More than 4 times per year    Marital Status: Divorced    Objective:  BP 110/60   Pulse 78   Temp 97.7 F (36.5 C) (Temporal)   Resp 18   Ht 4' 11 (1.499 m)   Wt 158 lb (71.7 kg)   SpO2 96%   BMI 31.91 kg/m      08/24/2023    9:07 AM 07/07/2023    7:32 AM 04/27/2023   10:15 AM  BP/Weight  Systolic BP 110 112 122  Diastolic BP 60 64 64  Wt. (Lbs) 158 155.4 158  BMI 31.91 kg/m2 31.39 kg/m2 31.91 kg/m2    Physical Exam Vitals reviewed.  Constitutional:      General: She is not in acute distress.    Appearance: Normal appearance. She is obese. She is not ill-appearing.  HENT:     Mouth/Throat:     Dentition: Abnormal dentition. Has dentures (partial). Dental caries present. No dental tenderness.  Eyes:     Conjunctiva/sclera: Conjunctivae normal.  Cardiovascular:     Rate and Rhythm: Normal rate and regular rhythm.     Heart sounds: Normal heart sounds. No murmur heard. Pulmonary:     Effort: Pulmonary effort is normal.     Breath sounds: Normal breath sounds. No wheezing.  Abdominal:     Palpations: Abdomen is soft.  Tenderness: There is abdominal tenderness.  Musculoskeletal:        General: Normal range of motion.  Skin:    General: Skin is warm.   Neurological:     Mental Status: She is alert. Mental status is at baseline.  Psychiatric:        Mood and Affect: Mood normal.        Behavior: Behavior normal.     Lab Results  Component Value Date   WBC 8.4 05/26/2023   HGB 13.3 05/26/2023   HCT 38.6 05/26/2023   PLT 293 05/26/2023   GLUCOSE 116 (H) 05/26/2023   CHOL 177 05/26/2023   TRIG 130 05/26/2023   HDL 63 05/26/2023   LDLCALC 91 05/26/2023   ALT 16 05/26/2023   AST 19 05/26/2023   NA 134 05/26/2023   K 4.6 05/26/2023   CL 97 05/26/2023   CREATININE 0.84 05/26/2023   BUN 13 05/26/2023   CO2 22 05/26/2023   TSH 2.980 05/26/2023   INR 0.99 01/21/2010   HGBA1C 6.2 (H) 05/26/2023   MICROALBUR 30 04/05/2019      Assessment & Plan:  Gastroesophageal reflux disease with hiatal hernia Assessment & Plan: Hiatal hernia likely causing nausea, abdominal discomfort, bloating, and pain. Endoscopy scheduled with Dr. Nicolina. Discussed dietary and lifestyle modifications. - Provide Voquezna  samples to replace Nexium  and evaluate effectiveness. - Follow up in two weeks to assess Voquezna  effectiveness. - Avoid caffeine, spicy foods, and acidic foods. - Elevate head of bed 6-8 inches to reduce reflux. - Avoid lying down or bending over for 2-3 hours post-meal.  Orders: -     Voquezna ; Take 10 mg by mouth daily.  Diverticulosis Assessment & Plan: Diverticulosis with recent hospitalization. Symptoms possibly linked to dietary choices. Advised dietary modifications to prevent symptom exacerbation. - Avoid foods with seeds and nuts. - Consume bland diet: toast, rice, chicken. - Avoid processed foods and foods with unknown ingredients. - limit caffeine, citrus, spicy or high acidic foods.   Generalized abdominal pain Assessment & Plan: Differentials include Hiatal hernia, reflux, nausea, food choices, or dental issues with decaying teeth and partial dentures being stuck. - Appointment 8.27.25 with GI  - Dental  appointment later today to assess  - Continue nausea medication prescribed by the hospital as needed (Zofran  ineffective) phenergan  25 mg    Chronic idiopathic constipation Assessment & Plan: Well controlled Continue miralax every morning if no bowel movement repeat in the evening and at bedtime if needed. - Continue fiber as directed per Dr. Larene.   - Continue Linzess  72 mg sample given and effective, will discuss with specialist - GI appointment scheduled with GI on August 27th     Follow-up: Return in about 2 weeks (around 09/07/2023) for med check.   I,Angela Taylor,acting as a Neurosurgeon for Harrie CHRISTELLA Cedar, FNP.,have documented all relevant documentation on the behalf of Harrie CHRISTELLA Cedar, FNP,as directed by  Harrie CHRISTELLA Cedar, FNP while in the presence of Harrie CHRISTELLA Cedar, FNP.   An After Visit Summary was printed and given to the patient.  I attest that I have reviewed this visit and agree with the plan scribed by my staff.   Harrie CHRISTELLA Cedar, FNP [facility] [phone]

## 2023-08-24 NOTE — Assessment & Plan Note (Signed)
 Diverticulosis with recent hospitalization. Symptoms possibly linked to dietary choices. Advised dietary modifications to prevent symptom exacerbation. - Avoid foods with seeds and nuts. - Consume bland diet: toast, rice, chicken. - Avoid processed foods and foods with unknown ingredients. - limit caffeine, citrus, spicy or high acidic foods.

## 2023-08-27 ENCOUNTER — Ambulatory Visit (INDEPENDENT_AMBULATORY_CARE_PROVIDER_SITE_OTHER): Admitting: Family Medicine

## 2023-08-27 ENCOUNTER — Encounter: Payer: Self-pay | Admitting: Family Medicine

## 2023-08-27 VITALS — BP 128/64 | HR 80 | Temp 98.1°F | Ht 59.0 in | Wt 154.0 lb

## 2023-08-27 DIAGNOSIS — E1142 Type 2 diabetes mellitus with diabetic polyneuropathy: Secondary | ICD-10-CM

## 2023-08-27 DIAGNOSIS — E785 Hyperlipidemia, unspecified: Secondary | ICD-10-CM

## 2023-08-27 DIAGNOSIS — K219 Gastro-esophageal reflux disease without esophagitis: Secondary | ICD-10-CM

## 2023-08-27 DIAGNOSIS — E039 Hypothyroidism, unspecified: Secondary | ICD-10-CM | POA: Diagnosis not present

## 2023-08-27 DIAGNOSIS — E1159 Type 2 diabetes mellitus with other circulatory complications: Secondary | ICD-10-CM | POA: Diagnosis not present

## 2023-08-27 DIAGNOSIS — E1169 Type 2 diabetes mellitus with other specified complication: Secondary | ICD-10-CM | POA: Diagnosis not present

## 2023-08-27 DIAGNOSIS — K5904 Chronic idiopathic constipation: Secondary | ICD-10-CM

## 2023-08-27 DIAGNOSIS — K089 Disorder of teeth and supporting structures, unspecified: Secondary | ICD-10-CM

## 2023-08-27 DIAGNOSIS — E119 Type 2 diabetes mellitus without complications: Secondary | ICD-10-CM | POA: Diagnosis not present

## 2023-08-27 DIAGNOSIS — B351 Tinea unguium: Secondary | ICD-10-CM

## 2023-08-27 DIAGNOSIS — I10 Essential (primary) hypertension: Secondary | ICD-10-CM

## 2023-08-27 MED ORDER — LEVOTHYROXINE SODIUM 112 MCG PO TABS: 112.0000 ug | ORAL_TABLET | Freq: Every day | ORAL | 1 refills | Status: DC

## 2023-08-27 MED ORDER — ROSUVASTATIN CALCIUM 5 MG PO TABS
5.0000 mg | ORAL_TABLET | Freq: Every day | ORAL | 1 refills | Status: DC
Start: 2023-08-27 — End: 2023-09-24
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
## 2023-08-27 NOTE — Assessment & Plan Note (Deleted)
 Controlled Continue taking Nexium 40mg  as prescirbed Will adjust treatment depending on symptoms

## 2023-08-27 NOTE — Patient Instructions (Signed)
 Med [facility] [address], [place], [place] [address] [phone]

## 2023-08-27 NOTE — Assessment & Plan Note (Signed)
Control: good Recommend check sugars fasting daily. Recommend check feet daily. Recommend annual eye exams. Medicines: none Continue to work on eating a healthy diet and exercise.  Labs drawn today.    

## 2023-08-28 LAB — LIPID PANEL
Chol/HDL Ratio: 2.8 ratio (ref 0.0–4.4)
Cholesterol, Total: 165 mg/dL (ref 100–199)
HDL: 59 mg/dL (ref >39)
LDL Chol Calc (NIH): 77 mg/dL (ref 0–99)
Triglycerides: 175 mg/dL — ABNORMAL HIGH (ref 0–149)
VLDL Cholesterol Cal: 29 mg/dL (ref 5–40)

## 2023-08-28 LAB — CBC WITH DIFFERENTIAL/PLATELET
Basophils Absolute: 0.1 x10E3/uL (ref 0.0–0.2)
Basos: 1 %
EOS (ABSOLUTE): 0.1 x10E3/uL (ref 0.0–0.4)
Eos: 1 %
Hematocrit: 40.9 % (ref 34.0–46.6)
Hemoglobin: 13.5 g/dL (ref 11.1–15.9)
Immature Grans (Abs): 0 x10E3/uL (ref 0.0–0.1)
Immature Granulocytes: 0 %
Lymphocytes Absolute: 1.6 x10E3/uL (ref 0.7–3.1)
Lymphs: 17 %
MCH: 32.5 pg (ref 26.6–33.0)
MCHC: 33 g/dL (ref 31.5–35.7)
MCV: 99 fL — ABNORMAL HIGH (ref 79–97)
Monocytes Absolute: 0.6 x10E3/uL (ref 0.1–0.9)
Monocytes: 7 %
Neutrophils Absolute: 6.9 x10E3/uL (ref 1.4–7.0)
Neutrophils: 74 %
Platelets: 273 x10E3/uL (ref 150–450)
RBC: 4.15 x10E6/uL (ref 3.77–5.28)
RDW: 12.8 % (ref 11.7–15.4)
WBC: 9.3 x10E3/uL (ref 3.4–10.8)

## 2023-08-28 LAB — COMPREHENSIVE METABOLIC PANEL WITH GFR
ALT: 15 IU/L (ref 0–32)
AST: 18 IU/L (ref 0–40)
Albumin: 4.3 g/dL (ref 3.7–4.7)
Alkaline Phosphatase: 131 IU/L — ABNORMAL HIGH (ref 44–121)
BUN/Creatinine Ratio: 10 — ABNORMAL LOW (ref 12–28)
BUN: 9 mg/dL (ref 8–27)
Bilirubin Total: 0.5 mg/dL (ref 0.0–1.2)
CO2: 22 mmol/L (ref 20–29)
Calcium: 10 mg/dL (ref 8.7–10.3)
Chloride: 95 mmol/L — ABNORMAL LOW (ref 96–106)
Creatinine, Ser: 0.87 mg/dL (ref 0.57–1.00)
Globulin, Total: 2.8 g/dL (ref 1.5–4.5)
Glucose: 116 mg/dL — ABNORMAL HIGH (ref 70–99)
Potassium: 4.6 mmol/L (ref 3.5–5.2)
Sodium: 135 mmol/L (ref 134–144)
Total Protein: 7.1 g/dL (ref 6.0–8.5)
eGFR: 66 mL/min/1.73 (ref >59)

## 2023-08-28 LAB — HEMOGLOBIN A1C
Est. average glucose Bld gHb Est-mCnc: 137 mg/dL
Hgb A1c MFr Bld: 6.4 % — ABNORMAL HIGH (ref 4.8–5.6)

## 2023-08-29 ENCOUNTER — Ambulatory Visit: Payer: Self-pay | Admitting: Family Medicine

## 2023-08-29 DIAGNOSIS — K089 Disorder of teeth and supporting structures, unspecified: Secondary | ICD-10-CM | POA: Insufficient documentation

## 2023-08-29 DIAGNOSIS — B351 Tinea unguium: Secondary | ICD-10-CM | POA: Insufficient documentation

## 2023-08-29 NOTE — Assessment & Plan Note (Signed)
 Dental issues with decaying teeth and difficulty with denture removal. Scheduled for dental evaluation. - Attend dental appointment on September 01, 2023.

## 2023-08-29 NOTE — Assessment & Plan Note (Signed)
 Intermittent constipation managed with polyethylene glycol and occasional Linzess . Discontinued psyllium due to difficulty consuming it.

## 2023-08-29 NOTE — Assessment & Plan Note (Signed)
Well controlled.  ?No changes to medicines.  ?Continue to work on eating a healthy diet and exercise.  ?Labs drawn today.  ?

## 2023-08-29 NOTE — Assessment & Plan Note (Signed)
 Previously well controlled Continue Synthroid at current dose

## 2023-08-29 NOTE — Assessment & Plan Note (Signed)
 Well controlled.  No changes to medicines.  Continue to work on eating a healthy diet and exercise.  Labs drawn today.    Diabetes well-controlled with blood glucose levels 120-130 mg/dL, occasional spikes to 849-839 mg/dL. Not regularly monitoring blood glucose. - Encourage regular blood glucose monitoring.

## 2023-08-29 NOTE — Assessment & Plan Note (Signed)
 Chronic nausea and abdominal distension with a small hernia identified on CT scan. On Voquezna  for GERD, advised to avoid acidic foods. Scheduled for colonoscopy and endoscopy. - Continue Voquezna  for two weeks, then reassess. - Attend colonoscopy and endoscopy on October 06, 2023. - Avoid acidic foods.

## 2023-08-29 NOTE — Assessment & Plan Note (Signed)
 Fungal infection with toenail discoloration and thickening. Under podiatric care. - Continue follow-up with podiatrist.

## 2023-09-06 ENCOUNTER — Ambulatory Visit: Payer: Self-pay | Admitting: Family Medicine

## 2023-09-07 ENCOUNTER — Ambulatory Visit: Admitting: Family Medicine

## 2023-09-08 ENCOUNTER — Ambulatory Visit (INDEPENDENT_AMBULATORY_CARE_PROVIDER_SITE_OTHER): Admitting: Family Medicine

## 2023-09-08 ENCOUNTER — Encounter: Payer: Self-pay | Admitting: Family Medicine

## 2023-09-08 VITALS — BP 116/62 | HR 83 | Temp 98.0°F | Ht 59.0 in | Wt 159.0 lb

## 2023-09-08 DIAGNOSIS — K5904 Chronic idiopathic constipation: Secondary | ICD-10-CM | POA: Diagnosis not present

## 2023-09-08 DIAGNOSIS — K219 Gastro-esophageal reflux disease without esophagitis: Secondary | ICD-10-CM | POA: Diagnosis not present

## 2023-09-08 DIAGNOSIS — K429 Umbilical hernia without obstruction or gangrene: Secondary | ICD-10-CM | POA: Diagnosis not present

## 2023-09-08 DIAGNOSIS — R110 Nausea: Secondary | ICD-10-CM

## 2023-09-08 NOTE — Patient Instructions (Addendum)
 VISIT SUMMARY:  Today, we discussed your persistent nausea and gastrointestinal symptoms. We reviewed your current medications and dietary habits, and made some adjustments to better manage your symptoms.  YOUR PLAN:  CHRONIC NAUSEA: You have been experiencing persistent nausea in the upper abdomen, which is not related to food intake or gallbladder issues. -Continue taking ondansetron  (Zofran ) as needed for nausea. -Consider using Gas-X for bloating. -Trial on probiotic (I gave samples of restora once daily.] You may get this otc or get a generic similar probiotic.   GASTROESOPHAGEAL REFLUX DISEASE (GERD): Your current medication, Voquezna , has not been effective in helping nausea and upper abdominal discomfort.  -Switch back to Nexium  for better management of acid reflux.  CONSTIPATION: Your constipation is currently managed with Miralax, and you have regular bowel movements. -Continue using Miralax as needed to maintain regular bowel movements.  SMALL UMBILICAL HERNIA: You have a small umbilical hernia that is not the cause of your nausea.. -No specific treatment is needed for the hernia at this time.

## 2023-09-08 NOTE — Assessment & Plan Note (Signed)
 Constipation managed with Miralax, bowel movements once daily. - Continue Miralax as needed.

## 2023-09-08 NOTE — Assessment & Plan Note (Signed)
 Voquezna  no more effective for GERD than Nexium . Neither helps with nausea.  Complete voquezna  samples then revert to Nexium  for GERD management.

## 2023-09-08 NOTE — Assessment & Plan Note (Signed)
 Small umbilical hernia not causing nausea or pain.

## 2023-09-08 NOTE — Assessment & Plan Note (Signed)
 Chronic nausea in upper abdomen, not linked to food intake or gallbladder issues. Small umbilical hernia unlikely cause. -Trial on probiotic (I gave samples of restora once daily.] You may get this otc or get a generic similar probiotic.  - Continue ondansetron  as needed. - Consider Gas-X for bloating. - Discussed use of peppermint.  - Keep appointment with Dr. Larene.

## 2023-09-08 NOTE — Progress Notes (Unsigned)
 Acute Office Visit  Subjective:    Patient ID: Amy Mccann, female    DOB: 09-26-1940, 83 y.o.   MRN: [id number]  Chief Complaint  Patient presents with   Abdominal Pain    HPI: Patient is in today for abdominal pain and nausea comes and goes has been going on since the beginning of the year. Has been taking voquezna  10 mg daily which has not helped very much. States she feels bloated. Eating does not seem to make a difference in symptoms.  Discussed the use of AI scribe software for clinical note transcription with the patient, who gave verbal consent to proceed.  History of Present Illness  Amy Mccann is an 83 year old female with a history of acid reflux and diverticulitis who presents with persistent nausea.  Nausea and gastrointestinal symptoms - Persistent nausea localized across the upper abdomen in a band-like pattern - Nausea occurs frequently, including in the mornings - Nausea sometimes severe enough to cause her to be 'bent over' - No significant abdominal pain associated with the nausea - No vomiting - Relief of nausea and bloating with burping or passage of flatus  Medication use and response - Currently taking Voquezna  10 mg once daily for the past two weeks, switched from previous Nexium  40 mg - Uncertain if Voquezna  has improved symptoms - Uses Zofran  for nausea, which provides relief; took one dose this morning and occasionally takes two doses in a day if needed - Has tried omeprazole and Nexium  in the past, which improved acid reflux but not nausea  Bowel habits and stool characteristics - Bowel movements occur once daily - Uses Miralax in coffee most mornings to aid bowel movements - Prescribed Linzess  for constipation, used sparingly - Stools are black - Uses Pepto-Bismol before bed  Dietary modifications and triggers - Avoids nuts and seeds due to previous diagnosis of diverticulitis - Fried foods do not exacerbate symptoms; able to eat fried  fish without issue  Abdominal findings - History of small umbilical hernia identified on CT scan      Past Medical History:  Diagnosis Date   Age-related osteoporosis without current pathological fracture 07/03/2019   Cancer (HCC) 1990   Left Breast Cancer    Cancer (HCC) 2017   Thyroid  Cancer   Cervicalgia 07/03/2019   Diabetes mellitus without complication (HCC)    Generalized anxiety disorder    GERD (gastroesophageal reflux disease)    Hypertension    Low back pain with sciatica 07/03/2019   Obesity due to excess calories 07/03/2019   S/P repair of paraesophageal hernia 10/20/2013   S/P TKR (total knee replacement) 07/25/2012    Past Surgical History:  Procedure Laterality Date   24 HOUR PH STUDY N/A 08/07/2013   Procedure: 24 HOUR PH STUDY;  Surgeon: Norleen JAYSON Hint, MD;  Location: [facility];  Service: Endoscopy;  Laterality: N/A;   APPENDECTOMY  03/17/2016   ESOPHAGEAL MANOMETRY N/A 08/07/2013   Procedure: ESOPHAGEAL MANOMETRY (EM);  Surgeon: Norleen JAYSON Hint, MD;  Location: [facility];  Service: Endoscopy;  Laterality: N/A;   HERNIA SURGERY  2015   HIATAL HERNIA SURGERY (FUNDOPLICATION.)   JOINT REPLACEMENT Right    right knee   MASTECTOMY Left 1990   THYROIDECTOMY Bilateral 08/2015    Family History  Problem Relation Age of Onset   Leukemia Brother     Social History   Socioeconomic History   Marital status: Divorced    Spouse name: Not on file  Number of children: 0   Years of education: Not on file   Highest education level: Not on file  Occupational History   Occupation: Retired from [employer]  Tobacco Use   Smoking status: Never    Passive exposure: Never   Smokeless tobacco: Never  Vaping Use   Vaping status: Never Used  Substance and Sexual Activity   Alcohol use: Never   Drug use: Never   Sexual activity: Not Currently  Other Topics Concern   Not on file  Social History Narrative   One sister lives near by, the other sister lives in [place]     Social Drivers of Health   Financial Resource Strain: Low Risk  (03/29/2023)   Overall Financial Resource Strain (CARDIA)    Difficulty of Paying Living Expenses: Not hard at all  Food Insecurity: No Food Insecurity (03/29/2023)   Hunger Vital Sign    Worried About Running Out of Food in the Last Year: Never true    Ran Out of Food in the Last Year: Never true  Transportation Needs: No Transportation Needs (03/29/2023)   PRAPARE - Administrator, Civil Service (Medical): No    Lack of Transportation (Non-Medical): No  Physical Activity: Insufficiently Active (03/29/2023)   Exercise Vital Sign    Days of Exercise per Week: 7 days    Minutes of Exercise per Session: 20 min  Stress: No Stress Concern Present (03/29/2023)   [employer] of Occupational Health - Occupational Stress Questionnaire    Feeling of Stress : Not at all  Social Connections: Moderately Integrated (03/29/2023)   Social Connection and Isolation Panel    Frequency of Communication with Friends and Family: More than three times a week    Frequency of Social Gatherings with Friends and Family: More than three times a week    Attends Religious Services: More than 4 times per year    Active Member of [employer] or Organizations: No    Attends Engineer, structural: More than 4 times per year    Marital Status: Divorced  Catering manager Violence: Not At Risk (03/29/2023)   Humiliation, Afraid, Rape, and Kick questionnaire    Fear of Current or Ex-Partner: No    Emotionally Abused: No    Physically Abused: No    Sexually Abused: No    Outpatient Medications Prior to Visit  Medication Sig Dispense Refill   ACCU-CHEK AVIVA PLUS test strip USE TO CHECK FASTING BLOOD SUGAR ONCE DAILY AS DIRECTED 100 strip 3   Accu-Chek Softclix Lancets lancets USE AS DIRECTED 100 each 5   acetaminophen (TYLENOL) 500 MG tablet Take 500 mg by mouth every 6 (six) hours as needed.     CALCIUM  PO Take 600 mg by mouth daily.      Cholecalciferol (VITAMIN D3 PO) Take 1,200 mg by mouth daily.     esomeprazole  (NEXIUM ) 40 MG capsule TAKE 1 CAPSULE(40 MG) BY MOUTH TWICE DAILY BEFORE A MEAL (Patient not taking: Reported on 09/08/2023) 180 capsule 3   levothyroxine  (SYNTHROID ) 112 MCG tablet Take 1 tablet (112 mcg total) by mouth daily before breakfast. 90 tablet 1   linaclotide  (LINZESS ) 72 MCG capsule Take 1 capsule (72 mcg total) by mouth daily before breakfast.     losartan (COZAAR) 25 MG tablet TAKE 1 TABLET BY MOUTH DAILY 90 tablet 1   Omega-3 Fatty Acids (FISH OIL) 1000 MG CAPS Take 1,000 mg by mouth daily. (Patient taking differently: Take 1,000 mg by mouth every other  day.)     ondansetron  (ZOFRAN -ODT) 8 MG disintegrating tablet DISSOLVE 1/2 TABLET(4 MG) ON THE TONGUE EVERY 8 HOURS AS NEEDED FOR NAUSEA OR VOMITING 30 tablet 1   promethazine  (PHENERGAN ) 25 MG tablet Take 25 mg by mouth every 8 (eight) hours as needed.     psyllium (METAMUCIL) 58.6 % packet Take by mouth.     rosuvastatin  (CRESTOR ) 5 MG tablet Take 1 tablet (5 mg total) by mouth daily. 90 tablet 1   Vonoprazan Fumarate  (VOQUEZNA ) 10 MG TABS Take 10 mg by mouth daily.     No facility-administered medications prior to visit.    Allergies  Allergen Reactions   Ergocalciferol  Other (See Comments)    somnolence   Codeine Itching and Rash   Penicillins Rash    Review of Systems  Constitutional:  Negative for chills, fatigue and fever.  HENT:  Negative for congestion, ear pain, rhinorrhea and sore throat.   Respiratory:  Negative for cough and shortness of breath.   Cardiovascular:  Negative for chest pain.  Gastrointestinal:  Negative for abdominal pain, constipation, diarrhea, nausea and vomiting.  Genitourinary:  Negative for dysuria and urgency.  Musculoskeletal:  Negative for back pain and myalgias.  Neurological:  Negative for dizziness, weakness, light-headedness and headaches.  Psychiatric/Behavioral:  Negative for dysphoric mood. The  patient is not nervous/anxious.        Objective:        09/08/2023    9:32 AM 08/27/2023    9:06 AM 08/24/2023    9:07 AM  Vitals with BMI  Height 4' 11 4' 11 4' 11  Weight 159 lbs 154 lbs 158 lbs  BMI 32.1 31.09 31.89  Systolic 116 128 889  Diastolic 62 64 60  Pulse 83 80 78    No data found.   Physical Exam  Health Maintenance Due  Topic Date Due   OPHTHALMOLOGY EXAM  08/19/2023    There are no preventive care reminders to display for this patient.   Lab Results  Component Value Date   TSH 2.980 05/26/2023   Lab Results  Component Value Date   WBC 9.3 08/27/2023   HGB 13.5 08/27/2023   HCT 40.9 08/27/2023   MCV 99 (H) 08/27/2023   PLT 273 08/27/2023   Lab Results  Component Value Date   NA 135 08/27/2023   K 4.6 08/27/2023   CO2 22 08/27/2023   GLUCOSE 116 (H) 08/27/2023   BUN 9 08/27/2023   CREATININE 0.87 08/27/2023   BILITOT 0.5 08/27/2023   ALKPHOS 131 (H) 08/27/2023   AST 18 08/27/2023   ALT 15 08/27/2023   PROT 7.1 08/27/2023   ALBUMIN 4.3 08/27/2023   CALCIUM  10.0 08/27/2023   EGFR 66 08/27/2023   Lab Results  Component Value Date   CHOL 165 08/27/2023   Lab Results  Component Value Date   HDL 59 08/27/2023   Lab Results  Component Value Date   LDLCALC 77 08/27/2023   Lab Results  Component Value Date   TRIG 175 (H) 08/27/2023   Lab Results  Component Value Date   CHOLHDL 2.8 08/27/2023   Lab Results  Component Value Date   HGBA1C 6.4 (H) 08/27/2023       Assessment & Plan:  GERD without esophagitis Assessment & Plan: Voquezna  ineffective for GERD. Nexium  effective for acid reflux but not nausea. - Revert to Nexium  for GERD management.   Chronic idiopathic constipation Assessment & Plan: Constipation managed with Miralax, bowel movements once daily. -  Continue Miralax as needed.   Chronic nausea Assessment & Plan: Chronic nausea in upper abdomen, not linked to food intake or gallbladder issues. Small  umbilical hernia unlikely cause. - Continue ondansetron  as needed. - Consider Gas-X for bloating.   Umbilical hernia without obstruction and without gangrene Assessment & Plan: Small umbilical hernia not causing nausea or pain.      No orders of the defined types were placed in this encounter.   No orders of the defined types were placed in this encounter.    Follow-up: No follow-ups on file.  An After Visit Summary was printed and given to the patient.  I attest that I have reviewed this visit and agree with the plan scribed by my staff.   LILLETTE Kato I Leal-Borjas,acting as a scribe for Abigail Free, MD.,have documented all relevant documentation on the behalf of Abigail Free, MD,as directed by  Abigail Free, MD while in the presence of Abigail Free, MD.   Abigail Free, MD [facility] [phone]

## 2023-09-12 DIAGNOSIS — R112 Nausea with vomiting, unspecified: Secondary | ICD-10-CM | POA: Diagnosis not present

## 2023-09-12 DIAGNOSIS — E119 Type 2 diabetes mellitus without complications: Secondary | ICD-10-CM | POA: Diagnosis not present

## 2023-09-12 DIAGNOSIS — R1013 Epigastric pain: Secondary | ICD-10-CM | POA: Diagnosis not present

## 2023-09-12 DIAGNOSIS — E785 Hyperlipidemia, unspecified: Secondary | ICD-10-CM | POA: Diagnosis not present

## 2023-09-15 DIAGNOSIS — M6208 Separation of muscle (nontraumatic), other site: Secondary | ICD-10-CM | POA: Diagnosis not present

## 2023-09-24 ENCOUNTER — Encounter: Payer: Self-pay | Admitting: Family Medicine

## 2023-09-24 ENCOUNTER — Ambulatory Visit (INDEPENDENT_AMBULATORY_CARE_PROVIDER_SITE_OTHER): Admitting: Family Medicine

## 2023-09-24 VITALS — BP 110/60 | HR 72 | Temp 97.4°F | Resp 18 | Ht 59.0 in | Wt 155.0 lb

## 2023-09-24 DIAGNOSIS — E782 Mixed hyperlipidemia: Secondary | ICD-10-CM

## 2023-09-24 DIAGNOSIS — K449 Diaphragmatic hernia without obstruction or gangrene: Secondary | ICD-10-CM

## 2023-09-24 DIAGNOSIS — R110 Nausea: Secondary | ICD-10-CM | POA: Diagnosis not present

## 2023-09-24 DIAGNOSIS — K5904 Chronic idiopathic constipation: Secondary | ICD-10-CM | POA: Diagnosis not present

## 2023-09-24 DIAGNOSIS — E039 Hypothyroidism, unspecified: Secondary | ICD-10-CM | POA: Diagnosis not present

## 2023-09-24 DIAGNOSIS — I10 Essential (primary) hypertension: Secondary | ICD-10-CM | POA: Diagnosis not present

## 2023-09-24 DIAGNOSIS — K219 Gastro-esophageal reflux disease without esophagitis: Secondary | ICD-10-CM | POA: Diagnosis not present

## 2023-09-24 DIAGNOSIS — E785 Hyperlipidemia, unspecified: Secondary | ICD-10-CM

## 2023-09-24 DIAGNOSIS — E119 Type 2 diabetes mellitus without complications: Secondary | ICD-10-CM

## 2023-09-24 DIAGNOSIS — E1169 Type 2 diabetes mellitus with other specified complication: Secondary | ICD-10-CM | POA: Diagnosis not present

## 2023-09-24 MED ORDER — METOCLOPRAMIDE HCL 10 MG PO TABS: 10.0000 mg | ORAL_TABLET | Freq: Three times a day (TID) | ORAL | 1 refills | Status: DC

## 2023-09-24 MED ORDER — ROSUVASTATIN CALCIUM 5 MG PO TABS: 5.0000 mg | ORAL_TABLET | Freq: Every day | ORAL | 1 refills | Status: AC

## 2023-09-24 MED ORDER — PROMETHAZINE HCL 25 MG PO TABS
25.0000 mg | ORAL_TABLET | Freq: Three times a day (TID) | ORAL | 2 refills | Status: DC | PRN
Start: 2023-09-24 — End: 2023-12-27

## 2023-09-24 MED ORDER — LEVOTHYROXINE SODIUM 112 MCG PO TABS: 112.0000 ug | ORAL_TABLET | Freq: Every day | ORAL | 1 refills | Status: AC

## 2023-09-24 MED ORDER — LOSARTAN POTASSIUM 25 MG PO TABS
25.0000 mg | ORAL_TABLET | Freq: Every day | ORAL | 1 refills | Status: AC
Start: 2023-09-24 — End: ?

## 2023-09-24 MED ORDER — SUCRALFATE 1 GM/10ML PO SUSP: 1.0000 g | Freq: Three times a day (TID) | ORAL | 1 refills | Status: DC

## 2023-09-24 MED ORDER — ESOMEPRAZOLE MAGNESIUM 40 MG PO CPDR: 40.0000 mg | DELAYED_RELEASE_CAPSULE | Freq: Two times a day (BID) | ORAL | 3 refills | Status: AC

## 2023-09-24 NOTE — Patient Instructions (Signed)
 NAUSEA/REFLUX/HIATAL HERNIA - INCREASE METOCLOPRAMIDE  TO ONE 30 MINUTE BEFORE MEALS AND BEFORE     BEDTIME.  - INCREASE SUCRALFATE  (CARAFATE ) TAKE 2 TEASPOONS THREE TIMES BEFORE MEALS.  - CONTINUE PROMETHAZINE  25 MG UP TO THREE TIMES A DAY AS NEEDED FOR NAUSEA.   - I ORDERED A GASTRIC EMPTYING STUDY AT [facility].   - KEEP APPOINTMENTS WITH DR. LARA OFFICE AND DR WESTCOT'S OFFICE.   CONSTIPATION STOP LINZESS .  USE MIRALAX AND METAMUCIL FOR CONSTIPATION.  HYPERTENSION: - LOSARTAN  25 MG DAILY  HIGH CHOLESTEROL - CRESTOR  5 MG every day

## 2023-09-24 NOTE — Progress Notes (Signed)
 Subjective:  Patient ID: Amy Mccann, female    DOB: [birth year]  Age: 83 y.o. MRN: [id number]  Chief Complaint  Patient presents with   Follow-up   Abdominal Pain   Nausea   Discussed the use of AI scribe software for clinical note transcription with the patient, who gave verbal consent to proceed.  History of Present Illness   Amy Mccann is an 83 year old female with a history of hernia and diverticulitis who presents with nausea and abdominal discomfort.  Nausea and abdominal discomfort - Significant nausea and abdominal discomfort associated with hernia - Severe pain on August 3rd requiring evaluation at [facility] - Underwent EKG and blood work during hospital visit - Prescribed sucralfate , taken in varying doses for symptom relief - Prescribed a medication for nausea, which is effective - Symptoms interfere with ability to maintain usual activities, such as attending church - EKG showed normal rate, normal sinus rhythm with a right bundle branch block, normal axis, normal intervals, and no significant ST segment deviations other than that attributable to RBBB.  -Troponin undetectable. CBC WNL. CMP with sodium of 130 but otherwise WNL. Lipase WNL.   Gastrointestinal dietary modifications and bowel habits - Avoids foods with seeds, nuts, and high acidity due to diverticulitis diagnosis on May 26th - Diet primarily consists of chicken, fish, pasta, and salads - Uses Miralax mixed with coffee for constipation - Occasionally takes Metamucil - Takes Nexium  (esomeprazole ) 40 mg twice daily.   Hypertension - losartan  25 mg once daily.  Hypothyroidism - levothyroxine  112 mcg once daily in am.          07/07/2023    7:36 AM 03/29/2023    1:48 PM 09/25/2022    9:52 AM 03/05/2022   10:19 AM 03/05/2022    9:20 AM  Depression screen PHQ 2/9  Decreased Interest 0 0 0 0 1  Down, Depressed, Hopeless 0 1 0 0 0  PHQ - 2 Score 0 1 0 0 1  Altered sleeping  0  0   Tired,  decreased energy  1  0   Change in appetite  0  0   Feeling bad or failure about yourself   0  0   Trouble concentrating  0  0   Moving slowly or fidgety/restless  0  0   Suicidal thoughts  0  0   PHQ-9 Score  2  0   Difficult doing work/chores  Not difficult at all           07/07/2023    7:36 AM  Fall Risk   Falls in the past year? 0  Number falls in past yr: 0  Injury with Fall? 0  Risk for fall due to : No Fall Risks    Patient Care Team: Sherre Clapper, MD as PCP - General (Family Medicine) Joshua Alm Hamilton, MD as Consulting Physician (Neurosurgery) Misenheimer, Evalene, MD as Consulting Physician (Unknown Physician Specialty) Cornelius Terril DEL, MD as Consulting Physician (Oncology) Laurice Grice, OD (Optometry)   Review of Systems  Constitutional:  Negative for chills, fatigue and fever.  HENT:  Negative for congestion, ear pain and sore throat.   Respiratory:  Negative for cough and shortness of breath.   Cardiovascular:  Negative for chest pain and palpitations.  Gastrointestinal:  Positive for abdominal pain (epigastric pain) and nausea. Negative for constipation, diarrhea and vomiting.  Endocrine: Negative for polydipsia, polyphagia and polyuria.  Genitourinary:  Negative for difficulty urinating and dysuria.  Musculoskeletal:  Negative for arthralgias, back pain and myalgias.  Skin:  Negative for rash.  Neurological:  Negative for headaches.  Psychiatric/Behavioral:  Negative for dysphoric mood. The patient is not nervous/anxious.     Current Outpatient Medications on File Prior to Visit  Medication Sig Dispense Refill   ACCU-CHEK AVIVA PLUS test strip USE TO CHECK FASTING BLOOD SUGAR ONCE DAILY AS DIRECTED 100 strip 3   Accu-Chek Softclix Lancets lancets USE AS DIRECTED 100 each 5   acetaminophen (TYLENOL) 500 MG tablet Take 500 mg by mouth every 6 (six) hours as needed.     CALCIUM  PO Take 600 mg by mouth daily.     Cholecalciferol (VITAMIN D3 PO) Take  1,200 mg by mouth daily.     psyllium (METAMUCIL) 58.6 % packet Take 1 packet by mouth daily as needed. FOR CONSTIPATION     No current facility-administered medications on file prior to visit.   Past Medical History:  Diagnosis Date   Age-related osteoporosis without current pathological fracture 07/03/2019   Cancer [facility]) 1990   Left Breast Cancer    Cancer (HCC) 2017   Thyroid  Cancer   Cervicalgia 07/03/2019   Diabetes mellitus without complication (HCC)    Generalized anxiety disorder    GERD (gastroesophageal reflux disease)    Hypertension    Low back pain with sciatica 07/03/2019   Obesity due to excess calories 07/03/2019   S/P repair of paraesophageal hernia 10/20/2013   S/P TKR (total knee replacement) 07/25/2012   Past Surgical History:  Procedure Laterality Date   24 HOUR PH STUDY N/A 08/07/2013   Procedure: 24 HOUR PH STUDY;  Surgeon: Norleen JAYSON Hint, MD;  Location: [facility]COPY;  Service: Endoscopy;  Laterality: N/A;   APPENDECTOMY  03/17/2016   ESOPHAGEAL MANOMETRY N/A 08/07/2013   Procedure: ESOPHAGEAL MANOMETRY (EM);  Surgeon: Norleen JAYSON Hint, MD;  Location: [facility];  Service: Endoscopy;  Laterality: N/A;   HERNIA SURGERY  2015   HIATAL HERNIA SURGERY (FUNDOPLICATION.)   JOINT REPLACEMENT Right    right knee   MASTECTOMY Left 1990   THYROIDECTOMY Bilateral 08/2015    Family History  Problem Relation Age of Onset   Leukemia Brother    Social History   Socioeconomic History   Marital status: Divorced    Spouse name: Not on file   Number of children: 0   Years of education: Not on file   Highest education level: Not on file  Occupational History   Occupation: Retired from [employer]  Tobacco Use   Smoking status: Never    Passive exposure: Never   Smokeless tobacco: Never  Vaping Use   Vaping status: Never Used  Substance and Sexual Activity   Alcohol use: Never   Drug use: Never   Sexual activity: Not Currently  Other Topics Concern   Not on file  Social  History Narrative   One sister lives near by, the other sister lives in [place]    Social Drivers of Health   Financial Resource Strain: Low Risk  (03/29/2023)   Overall Financial Resource Strain (CARDIA)    Difficulty of Paying Living Expenses: Not hard at all  Food Insecurity: No Food Insecurity (03/29/2023)   Hunger Vital Sign    Worried About Running Out of Food in the Last Year: Never true    Ran Out of Food in the Last Year: Never true  Transportation Needs: No Transportation Needs (03/29/2023)   PRAPARE - Administrator, Civil Service (Medical):  No    Lack of Transportation (Non-Medical): No  Physical Activity: Insufficiently Active (03/29/2023)   Exercise Vital Sign    Days of Exercise per Week: 7 days    Minutes of Exercise per Session: 20 min  Stress: No Stress Concern Present (03/29/2023)   [employer] of Occupational Health - Occupational Stress Questionnaire    Feeling of Stress : Not at all  Social Connections: Moderately Integrated (03/29/2023)   Social Connection and Isolation Panel    Frequency of Communication with Friends and Family: More than three times a week    Frequency of Social Gatherings with Friends and Family: More than three times a week    Attends Religious Services: More than 4 times per year    Active Member of [employer] or Organizations: No    Attends Engineer, structural: More than 4 times per year    Marital Status: Divorced    Objective:  BP 110/60   Pulse 72   Temp (!) 97.4 F (36.3 C)   Resp 18   Ht 4' 11 (1.499 m)   Wt 155 lb (70.3 kg)   SpO2 98%   BMI 31.31 kg/m      09/24/2023   10:51 AM 09/08/2023    9:32 AM 08/27/2023    9:06 AM  BP/Weight  Systolic BP 110 116 128  Diastolic BP 60 62 64  Wt. (Lbs) 155 159 154  BMI 31.31 kg/m2 32.11 kg/m2 31.1 kg/m2    Physical Exam Vitals reviewed.  Constitutional:      Appearance: Normal appearance. She is well-developed and normal weight.  Cardiovascular:     Rate  and Rhythm: Normal rate and regular rhythm.     Heart sounds: Normal heart sounds.  Pulmonary:     Effort: Pulmonary effort is normal. No respiratory distress.     Breath sounds: Normal breath sounds.  Abdominal:     General: Abdomen is flat. Bowel sounds are normal.     Palpations: Abdomen is soft.     Tenderness: There is abdominal tenderness (upper left quadrant). There is no guarding or rebound.  Neurological:     Mental Status: She is alert and oriented to person, place, and time.  Psychiatric:        Mood and Affect: Mood normal.        Behavior: Behavior normal.         Lab Results  Component Value Date   WBC 9.3 08/27/2023   HGB 13.5 08/27/2023   HCT 40.9 08/27/2023   PLT 273 08/27/2023   GLUCOSE 116 (H) 08/27/2023   CHOL 165 08/27/2023   TRIG 175 (H) 08/27/2023   HDL 59 08/27/2023   LDLCALC 77 08/27/2023   ALT 15 08/27/2023   AST 18 08/27/2023   NA 135 08/27/2023   K 4.6 08/27/2023   CL 95 (L) 08/27/2023   CREATININE 0.87 08/27/2023   BUN 9 08/27/2023   CO2 22 08/27/2023   TSH 2.980 05/26/2023   INR 0.99 01/21/2010   HGBA1C 6.4 (H) 08/27/2023   MICROALBUR 30 04/05/2019      Assessment & Plan:  Hiatal hernia Assessment & Plan: NAUSEA/REFLUX/HIATAL HERNIA - INCREASE METOCLOPRAMIDE  TO ONE 30 MINUTE BEFORE MEALS AND BEFORE  BEDTIME.  - INCREASE SUCRALFATE  (CARAFATE ) TAKE 2 TEASPOONS THREE TIMES BEFORE MEALS.  - CONTINUE PROMETHAZINE  25 MG UP TO THREE TIMES A DAY AS NEEDED FOR NAUSEA.   - I ORDERED A GASTRIC EMPTYING STUDY AT [facility].   - KEEP  APPOINTMENTS WITH DR. LARA OFFICE AND DR WESTCOT'S OFFICE.   Orders: -     NM GASTRIC EMPTYING; Future -     Metoclopramide  HCl; Take 1 tablet (10 mg total) by mouth 4 (four) times daily -  before meals and at bedtime. FOR IMPROVING STOMACH EMPTYING.  Dispense: 120 tablet; Refill: 1  Nausea Assessment & Plan: NAUSEA/REFLUX/HIATAL HERNIA - INCREASE METOCLOPRAMIDE  TO ONE 30 MINUTE BEFORE MEALS  AND BEFORE     BEDTIME.  - INCREASE SUCRALFATE  (CARAFATE ) TAKE 2 TEASPOONS THREE TIMES BEFORE MEALS.  - CONTINUE PROMETHAZINE  25 MG UP TO THREE TIMES A DAY AS NEEDED FOR NAUSEA.   - I ORDERED A GASTRIC EMPTYING STUDY AT [facility].   - KEEP APPOINTMENTS WITH DR. LARA OFFICE AND DR WESTCOT'S OFFICE.   Orders: -     Promethazine  HCl; Take 1 tablet (25 mg total) by mouth every 8 (eight) hours as needed. NAUSEA AND/OR VOMITING  Dispense: 30 tablet; Refill: 2 -     NM GASTRIC EMPTYING; Future -     Metoclopramide  HCl; Take 1 tablet (10 mg total) by mouth 4 (four) times daily -  before meals and at bedtime. FOR IMPROVING STOMACH EMPTYING.  Dispense: 120 tablet; Refill: 1  Gastroesophageal reflux disease without esophagitis -     NM GASTRIC EMPTYING; Future  GERD without esophagitis Assessment & Plan: NAUSEA/REFLUX/HIATAL HERNIA - INCREASE METOCLOPRAMIDE  TO ONE 30 MINUTE BEFORE MEALS AND BEFORE     BEDTIME.  - INCREASE SUCRALFATE  (CARAFATE ) TAKE 2 TEASPOONS THREE TIMES BEFORE MEALS.  - CONTINUE PROMETHAZINE  25 MG UP TO THREE TIMES A DAY AS NEEDED FOR NAUSEA.   - I ORDERED A GASTRIC EMPTYING STUDY AT [facility].   - KEEP APPOINTMENTS WITH DR. LARA OFFICE AND DR WESTCOT'S OFFICE.   Orders: -     Metoclopramide  HCl; Take 1 tablet (10 mg total) by mouth 4 (four) times daily -  before meals and at bedtime. FOR IMPROVING STOMACH EMPTYING.  Dispense: 120 tablet; Refill: 1 -     Sucralfate ; Take 10 mLs (1 g total) by mouth 3 (three) times daily before meals. For hiatal hernia/stomach inflammation  Dispense: 473 mL; Refill: 1 -     Esomeprazole  Magnesium ; Take 1 capsule (40 mg total) by mouth 2 (two) times daily before a meal. FOR REFLUX  Dispense: 180 capsule; Refill: 3  Hyperlipidemia associated with type 2 diabetes mellitus (HCC) Assessment & Plan: Diabetes and hyperlipidemia well controlled.  No changes to medicines. Continue crestor  5 mg before bed.  Continue to  work on eating a healthy diet and exercise.  Labs drawn today.   Orders: -     Rosuvastatin  Calcium ; Take 1 tablet (5 mg total) by mouth daily. FOR HIGH CHOLESTEROL  Dispense: 90 tablet; Refill: 1  Acquired hypothyroidism Assessment & Plan: Previously well controlled Continue Synthroid  at current dose    Orders: -     Levothyroxine  Sodium; Take 1 tablet (112 mcg total) by mouth daily before breakfast. FOR HYPOTHYROIDISM.  Dispense: 90 tablet; Refill: 1  Hypertension complicating diabetes (HCC) Assessment & Plan: Well controlled.  No changes to medicines. Continue losartan  25 mg daily.  Continue to work on eating a healthy diet and exercise.  Labs drawn today.    Orders: -     Losartan  Potassium; Take 1 tablet (25 mg total) by mouth daily. FOR HIGH BP  Dispense: 90 tablet; Refill: 1  Essential hypertension, benign Assessment & Plan: Well controlled.  No changes to medicines. Continue losartan   25 mg daily.  Continue to work on eating a healthy diet and exercise.  Labs drawn today.     Mixed hyperlipidemia Assessment & Plan: Well controlled.  No changes to medicines. Continue Rosuvastatin  5 mg take 1 tablet daily. Continue to work on eating a healthy diet and exercise.  Labs drawn today.   Chronic idiopathic constipation Assessment & Plan: STOP LINZESS .  USE MIRALAX AND METAMUCIL FOR CONSTIPATION.      Meds ordered this encounter  Medications   promethazine  (PHENERGAN ) 25 MG tablet    Sig: Take 1 tablet (25 mg total) by mouth every 8 (eight) hours as needed. NAUSEA AND/OR VOMITING    Dispense:  30 tablet    Refill:  2   metoCLOPramide  (REGLAN ) 10 MG tablet    Sig: Take 1 tablet (10 mg total) by mouth 4 (four) times daily -  before meals and at bedtime. FOR IMPROVING STOMACH EMPTYING.    Dispense:  120 tablet    Refill:  1   sucralfate  (CARAFATE ) 1 GM/10ML suspension    Sig: Take 10 mLs (1 g total) by mouth 3 (three) times daily before meals. For hiatal  hernia/stomach inflammation    Dispense:  473 mL    Refill:  1   esomeprazole  (NEXIUM ) 40 MG capsule    Sig: Take 1 capsule (40 mg total) by mouth 2 (two) times daily before a meal. FOR REFLUX    Dispense:  180 capsule    Refill:  3   losartan  (COZAAR ) 25 MG tablet    Sig: Take 1 tablet (25 mg total) by mouth daily. FOR HIGH BP    Dispense:  90 tablet    Refill:  1   rosuvastatin  (CRESTOR ) 5 MG tablet    Sig: Take 1 tablet (5 mg total) by mouth daily. FOR HIGH CHOLESTEROL    Dispense:  90 tablet    Refill:  1   levothyroxine  (SYNTHROID ) 112 MCG tablet    Sig: Take 1 tablet (112 mcg total) by mouth daily before breakfast. FOR HYPOTHYROIDISM.    Dispense:  90 tablet    Refill:  1    Orders Placed This Encounter  Procedures   NM Gastric Emptying     Follow-up: Return in about 3 months (around 12/25/2023), or if symptoms worsen or fail to improve.  An After Visit Summary was printed and given to the patient.  Abigail Free, MD [facility] [phone]

## 2023-09-26 ENCOUNTER — Encounter: Payer: Self-pay | Admitting: Family Medicine

## 2023-09-26 NOTE — Assessment & Plan Note (Signed)
 NAUSEA/REFLUX/HIATAL HERNIA - INCREASE METOCLOPRAMIDE  TO ONE 30 MINUTE BEFORE MEALS AND BEFORE     BEDTIME.  - INCREASE SUCRALFATE  (CARAFATE ) TAKE 2 TEASPOONS THREE TIMES BEFORE MEALS.  - CONTINUE PROMETHAZINE  25 MG UP TO THREE TIMES A DAY AS NEEDED FOR NAUSEA.   - I ORDERED A GASTRIC EMPTYING STUDY AT [facility].   - KEEP APPOINTMENTS WITH DR. LARA OFFICE AND DR WESTCOT'S OFFICE.

## 2023-09-26 NOTE — Assessment & Plan Note (Signed)
 Previously well controlled Continue Synthroid at current dose

## 2023-09-26 NOTE — Assessment & Plan Note (Signed)
 Diabetes and hyperlipidemia well controlled.  No changes to medicines. Continue crestor  5 mg before bed.  Continue to work on eating a healthy diet and exercise.  Labs drawn today.

## 2023-09-26 NOTE — Assessment & Plan Note (Signed)
 STOP LINZESS .  USE MIRALAX AND METAMUCIL FOR CONSTIPATION.

## 2023-09-26 NOTE — Assessment & Plan Note (Signed)
Well controlled.  No changes to medicines. Continue Rosuvastatin 5 mg take 1 tablet daily. Continue to work on eating a healthy diet and exercise.  Labs drawn today.

## 2023-09-26 NOTE — Assessment & Plan Note (Signed)
Well controlled.  No changes to medicines. Continue losartan 25 mg daily.  Continue to work on eating a healthy diet and exercise.  Labs drawn today.   

## 2023-10-06 DIAGNOSIS — K5790 Diverticulosis of intestine, part unspecified, without perforation or abscess without bleeding: Secondary | ICD-10-CM | POA: Diagnosis not present

## 2023-10-06 DIAGNOSIS — K219 Gastro-esophageal reflux disease without esophagitis: Secondary | ICD-10-CM | POA: Diagnosis not present

## 2023-10-06 DIAGNOSIS — K449 Diaphragmatic hernia without obstruction or gangrene: Secondary | ICD-10-CM | POA: Diagnosis not present

## 2023-10-06 DIAGNOSIS — R112 Nausea with vomiting, unspecified: Secondary | ICD-10-CM | POA: Diagnosis not present

## 2023-10-08 ENCOUNTER — Telehealth: Payer: Self-pay

## 2023-10-08 ENCOUNTER — Other Ambulatory Visit: Payer: Self-pay

## 2023-10-08 DIAGNOSIS — K449 Diaphragmatic hernia without obstruction or gangrene: Secondary | ICD-10-CM

## 2023-10-08 DIAGNOSIS — K429 Umbilical hernia without obstruction or gangrene: Secondary | ICD-10-CM

## 2023-10-08 DIAGNOSIS — K219 Gastro-esophageal reflux disease without esophagitis: Secondary | ICD-10-CM

## 2023-10-08 NOTE — Telephone Encounter (Signed)
 Copied from CRM #[id number]. Topic: Referral - Question >> Oct 08, 2023  9:51 AM Marylynn H wrote: Reason for CRM: Patient is requesting a referral to GI for an endoscopy ASAP. Wants to know if it can be sent to [facility], the other provider she was looking into seeing (Dr.Misenheimer) does not deal with hernias. Please advise # [phone], lvm if no answer if referral is placed

## 2023-10-12 NOTE — Telephone Encounter (Signed)
 I left detailed message to let her know that Dr Sirivol put the referral to GI in [facility].

## 2023-10-13 ENCOUNTER — Encounter (HOSPITAL_BASED_OUTPATIENT_CLINIC_OR_DEPARTMENT_OTHER): Payer: Self-pay | Admitting: Radiology

## 2023-10-13 ENCOUNTER — Inpatient Hospital Stay (HOSPITAL_BASED_OUTPATIENT_CLINIC_OR_DEPARTMENT_OTHER)
Admission: RE | Admit: 2023-10-13 | Discharge: 2023-10-13 | Disposition: A | Discharge status: Home / Self Care | Source: Ambulatory Visit | Attending: Oncology | Admitting: Oncology

## 2023-10-13 DIAGNOSIS — Z853 Personal history of malignant neoplasm of breast: Secondary | ICD-10-CM

## 2023-10-13 DIAGNOSIS — Z1231 Encounter for screening mammogram for malignant neoplasm of breast: Secondary | ICD-10-CM

## 2023-10-14 ENCOUNTER — Telehealth: Payer: Self-pay | Admitting: Family Medicine

## 2023-10-14 NOTE — Telephone Encounter (Unsigned)
 Copied from CRM [id number]. Topic: Referral - Question >> Oct 14, 2023 11:02 AM Donee H wrote: Reason for CRM: Patient called requesting for referral to be resent to [facility] for GI to have endoscopy. She stated she reached out to them and they stated never received referral. She also wanted to know if someone can call her once it has been sent at [phone]

## 2023-10-24 DIAGNOSIS — R1013 Epigastric pain: Secondary | ICD-10-CM | POA: Diagnosis not present

## 2023-10-25 ENCOUNTER — Telehealth: Payer: Self-pay

## 2023-10-25 NOTE — Telephone Encounter (Signed)
 Copied from CRM [id number]. Topic: Referral - Question >> Oct 25, 2023 10:14 AM Sophia H wrote: Reason for CRM: Patient states she is trying to get an endoscopy done but for some reason the clinic she was referred to is saying she is missing an authorized referral?  Looks like referral was put in by Dr. Sirivol, referral dates 10/08/2023-10/07/2024. Please reach out and clarify with patient, they will not schedule endoscopy without the authorization. # [phone]  [phone] - Office of Dorn Lauth (provider that patient was referred to)

## 2023-10-26 ENCOUNTER — Telehealth: Payer: Self-pay

## 2023-10-26 DIAGNOSIS — Z85850 Personal history of malignant neoplasm of thyroid: Secondary | ICD-10-CM | POA: Diagnosis not present

## 2023-10-26 DIAGNOSIS — R140 Abdominal distension (gaseous): Secondary | ICD-10-CM | POA: Diagnosis not present

## 2023-10-26 DIAGNOSIS — R1084 Generalized abdominal pain: Secondary | ICD-10-CM | POA: Diagnosis not present

## 2023-10-26 DIAGNOSIS — Z885 Allergy status to narcotic agent status: Secondary | ICD-10-CM | POA: Diagnosis not present

## 2023-10-26 DIAGNOSIS — R197 Diarrhea, unspecified: Secondary | ICD-10-CM | POA: Diagnosis not present

## 2023-10-26 DIAGNOSIS — K219 Gastro-esophageal reflux disease without esophagitis: Secondary | ICD-10-CM | POA: Diagnosis not present

## 2023-10-26 DIAGNOSIS — Z853 Personal history of malignant neoplasm of breast: Secondary | ICD-10-CM | POA: Diagnosis not present

## 2023-10-26 DIAGNOSIS — Z79899 Other long term (current) drug therapy: Secondary | ICD-10-CM | POA: Diagnosis not present

## 2023-10-26 DIAGNOSIS — I10 Essential (primary) hypertension: Secondary | ICD-10-CM | POA: Diagnosis not present

## 2023-10-26 DIAGNOSIS — K449 Diaphragmatic hernia without obstruction or gangrene: Secondary | ICD-10-CM | POA: Diagnosis not present

## 2023-10-26 DIAGNOSIS — E785 Hyperlipidemia, unspecified: Secondary | ICD-10-CM | POA: Diagnosis not present

## 2023-10-26 DIAGNOSIS — Z880 Allergy status to penicillin: Secondary | ICD-10-CM | POA: Diagnosis not present

## 2023-10-26 DIAGNOSIS — R109 Unspecified abdominal pain: Secondary | ICD-10-CM | POA: Diagnosis not present

## 2023-10-26 DIAGNOSIS — E119 Type 2 diabetes mellitus without complications: Secondary | ICD-10-CM | POA: Diagnosis not present

## 2023-10-26 NOTE — Telephone Encounter (Signed)
 Copied from CRM [id number]. Topic: Referral - Question >> Oct 25, 2023 10:14 AM Sophia H wrote: Reason for CRM: Patient states she is trying to get an endoscopy done but for some reason the clinic she was referred to is saying she is missing an authorized referral?  Looks like referral was put in by Dr. Sirivol, referral dates 10/08/2023-10/07/2024. Please reach out and clarify with patient, they will not schedule endoscopy without the authorization. # [phone]  [phone] - Office of Dorn Lauth (provider that patient was referred to) >> Oct 25, 2023  4:47 PM Winona R wrote: Pt states Lauth Dorn, MD, is no longer taking new pts and would like to know what DR. Cox would like for her to do now.

## 2023-10-26 NOTE — ED Provider Notes (Signed)
 [facility] eMERGENCY dEPARTMENT eNCOUnter     CLINICAL IMPRESSION Final diagnoses:  Hiatal hernia (Primary)    ASSESSMENT & PLAN 83yo w/ PMHx hiatal hernia, GERD, diverticulosis, chronic constipation, h/o appendectomy, presenting with acute on chronic abdominal pain and nausea. Most likely etiology is worsening of her baseline abdominal pain from hiatal hernia, vs possible obstruction with diarrheal overflow in the setting of laxatives, vs diverticulitis vs UTI/pyelo (though less likely without vomiting or fever). - CBC, CMP, lipase - f/u CT A/P w/ contrast - f/u EKG for Qtc  - following EKG can give IV antiemetics and PO challenge - 1L LR bolus  - f/u UA, consider Ucx  - following CT, can PO challenge if no contraindications to eating       Discussion with other professionals: None Independent interpretation: EKG(s) - NSR, Qtc 460 CT scan(s) - reassuring, no acute changes   Social Determinants that significantly affected care: Lacks transportation      ED Course as of 10/26/23 1543  Tue Oct 26, 2023  1515 CT A/P with no acute findings, no SBO, small hiatal hernia. Bloodwork very reassuring. Qtc 460, will defer further antiemetics at this time. Will PO challenge after completing LR bolus, and hope to discharge home.  1539 Feeling better after LR bolus. Has passed PO challenge keeping down liquids and peanut butter crackers. Will discharge with instructions to maintain PO hydration, take home medications including PPI, sucralfate , and phenergan , and f/u with GI in October as planned.        _______________________________________________________________  Time seen: October 26, 2023 2:05 PM  I have reviewed the triage vital signs and the nursing notes.  CHIEF COMPLAINT   Chief Complaint  Patient presents with  . Abdominal Pain    HPI  Amy Mccann is a 83 y.o. female with past medical history of Thyroid  Cancer (s/p thyroidectomy), HTN, Chronic Constipation, DM,  Diverticulosis, GERD, Hiatal Hernia, h/o Breast Cancer (s/p L mastectomy), HLD, h/o appendectomy who presents with intermittent nausea and abdominal pain for 8 months, worse for the past 2 days.  She reports 2 days ago started to have worsening intermittent nausea and lower abdominal pain. She has been taking her sucralfate  but forgets sometimes. Phenergan  and Protonix  do help her symptoms. She has not noted any vomiting, but has had 3 loose bowel movements today, black (also on pepto bismol), nonbloody. She took Miralax this morning and attributes the runny stools to that. She denies any dysuria, hematuria, fevers, sick contacts, CP, SOB.   She reports that she was previously told she is developing a hiatal hernia and has an appointment in October for that. Given the longstanding nature of her symptoms, she had difficulty explaining exactly why she presented today to the ED, though it seems that her symptoms are worse than her baseline at present.   PAST MEDICAL HISTORY   Past Medical History[1]  Problem List[2]  SURGICAL HISTORY   Past Surgical History[3]  CURRENT MEDICATIONS   No current facility-administered medications for this encounter.  Current Outpatient Medications:  .  esomeprazole  (NEXIUM ) 40 MG capsule, Take 1 capsule (40 mg total) by mouth daily before breakfast., Disp: , Rfl:  .  ascorbic acid (ASCORBIC ACID WITH ROSE HIPS) 500 MG tablet, Take 500 mg by mouth daily., Disp: , Rfl:  .  CALCIUM  CARBONATE (CALCIUM  500 ORAL), Take by mouth., Disp: , Rfl:  .  CHOLECALCIFEROL, VITAMIN D3, (VITAMIN D3 ORAL), Take by mouth., Disp: , Rfl:  .  cyanocobalamin (VITAMIN B-12) 1000  MCG tablet, Take 1,000 mcg by mouth daily., Disp: , Rfl:  .  levothyroxine  (SYNTHROID , LEVOTHROID) 100 MCG tablet, Take 100 mcg by mouth daily., Disp: , Rfl:  .  ondansetron  (ZOFRAN -ODT) 4 MG disintegrating tablet, Take 1 tablet (4 mg total) by mouth every eight (8) hours as needed. for up to 12 doses, Disp: 12  tablet, Rfl: 0 .  pantoprazole  (PROTONIX ) 40 MG tablet, Take 40 mg by mouth daily., Disp: , Rfl:  .  promethazine  (PHENERGAN ) 25 MG tablet, Take 1 tablet (25 mg total) by mouth., Disp: , Rfl:  .  ranitidine (ZANTAC) 150 MG tablet, Take 150 mg by mouth Two (2) times a day., Disp: , Rfl:  .  rosuvastatin  (CRESTOR ) 5 MG tablet, Take 5 mg by mouth daily., Disp: , Rfl:   ALLERGIES   Allergies[4]  FAMILY HISTORY   Family History[5]  SOCIAL HISTORY Social History[6]   PHYSICAL EXAM    VITAL SIGNS:   ED Triage Vitals [10/26/23 1140]  Enc Vitals Group     BP      Pulse 73     SpO2 Pulse      Resp 18     Temp 36.7 C (98.1 F)     Temp Source Oral     SpO2 97 %     Weight      Height      Head Circumference      Peak Flow      Pain Score      Pain Loc      Pain Education      Exclude from Growth Chart     Constitutional: Alert and oriented. Well appearing and in no distress.  HEENT      Head: Normocephalic and atraumatic.      Ears: normal external ears bilaterally      Eyes: Conjunctivae are normal. EOMI. No scleral icterus.      Nose: No congestion.      Mouth/Throat: Normal voice. Dry mucous membranes.      Neck: No stridor. Hematologic/Lymphatic/Immunologic: No cervical lymphadenopathy. Cardiovascular: Normal rate, regular rhythm. Systolic murmur appreciated.  Good distal peripheral pulses. No LE edema. Respiratory: Normal respiratory effort. CTAB with no rales, rhonchi, or wheeze.  Good air movement. Gastrointestinal: Soft, w/ hyperactive bowel sounds, and TTP worst in the RLQ without rebound or guarding Musculoskeletal: Normal range of motion in all extremities. Neurologic: Normal speech and language. No gross focal neurologic deficits are appreciated other than difficulty hearing. Motor 5/5. Sensation intact.  CN II-XII grossly intact.  No cerebellar signs. Skin: Skin is warm, dry and intact. No rash noted. Psychiatric: Mood and affect are normal. Speech and  behavior are normal.    RADIOLOGY   CT Abdomen Pelvis W Contrast  Final Result  - No acute findings in the abdomen/pelvis. No bowel obstruction.  - Hepatic steatosis.  - Chronic appearing T10 anterior compression deformity with 50% vertebral body height loss, new since 2019.      LABS Labs Reviewed  COMPREHENSIVE METABOLIC PANEL - Abnormal; Notable for the following components:      Result Value   Sodium 134 (*)    Alkaline Phosphatase 133 (*)    All other components within normal limits  LIPASE - Normal  CBC W/ DIFFERENTIAL   Narrative:    The following orders were created for panel order CBC w/ Differential.                 Procedure  Abnormality         Status                                    ---------                               -----------         ------                                    CBC w/ Differential[[id number]]                             Final result                                               Please view results for these tests on the individual orders.  URINALYSIS WITH MICROSCOPY  CBC W/ AUTO DIFF     ATTESTATIONS         [1] Past Medical History: Diagnosis Date  . Arthritis   . Breast cancer    (CMS-HCC)   . Cancer of thyroid  gland    (CMS-HCC)   . Diabetes mellitus    (CMS-HCC)   . Disease of thyroid  gland    Thyroid  cancer  . Diverticulitis   . GERD (gastroesophageal reflux disease)   . Hyperlipemia   [2] There is no problem list on file for this patient.  [3] Past Surgical History: Procedure Laterality Date  . HERNIA REPAIR    . JOINT REPLACEMENT     TKA  . MASTECTOMY    . THYROIDECTOMY, PARTIAL    . TONSILLECTOMY    [4] Allergies Allergen Reactions  . Penicillins   . Codeine Rash  [5] History reviewed. No pertinent family history. [6] Social History Socioeconomic History  . Marital status: Divorced    Spouse name: None  . Number of children: None  . Years of education: None  .  Highest education level: None  Tobacco Use  . Smoking status: Never  . Smokeless tobacco: Never  Vaping Use  . Vaping status: Never Used  Substance and Sexual Activity  . Alcohol use: No  . Drug use: Never   Social Drivers of Corporate investment banker Strain: Low Risk  (03/29/2023)   Received from [facility]   Overall Financial Resource Strain (CARDIA)   . Difficulty of Paying Living Expenses: Not hard at all  Food Insecurity: No Food Insecurity (03/29/2023)   Received from [facility]   Hunger Vital Sign   . Within the past 12 months, you worried that your food would run out before you got the money to buy more.: Never true   . Within the past 12 months, the food you bought just didn't last and you didn't have money to get more.: Never true  Transportation Needs: No Transportation Needs (03/29/2023)   Received from [facility] - Transportation   . Lack of Transportation (Medical): No   . Lack of Transportation (Non-Medical): No  Physical Activity: Insufficiently Active (03/29/2023)   Received from [facility]   Exercise Vital  Sign   . On average, how many days per week do you engage in moderate to strenuous exercise (like a brisk walk)?: 7 days   . On average, how many minutes do you engage in exercise at this level?: 20 min  Stress: No Stress Concern Present (03/29/2023)   Received from [facility] of Occupational Health - Occupational Stress Questionnaire   . Feeling of Stress : Not at all  Social Connections: Moderately Integrated (03/29/2023)   Received from [facility]   Social Connection and Isolation Panel   . In a typical week, how many times do you talk on the phone with family, friends, or neighbors?: More than three times a week   . How often do you get together with friends or relatives?: More than three times a week   . How often do you attend church or religious services?: More than 4 times per year   . Do you belong to any clubs or  organizations such as church groups, unions, fraternal or athletic groups, or school groups?: No   . How often do you attend meetings of the clubs or organizations you belong to?: More than 4 times per year   . Are you married, widowed, divorced, separated, never married, or living with a partner?: Divorced   Nessim, Margaret Schooner, MD 10/26/23 [phone]

## 2023-10-27 ENCOUNTER — Other Ambulatory Visit: Payer: Self-pay | Admitting: Family Medicine

## 2023-10-27 ENCOUNTER — Ambulatory Visit: Payer: Self-pay

## 2023-10-27 ENCOUNTER — Telehealth: Payer: Self-pay

## 2023-10-27 ENCOUNTER — Telehealth: Payer: Self-pay | Admitting: Family Medicine

## 2023-10-27 DIAGNOSIS — R110 Nausea: Secondary | ICD-10-CM

## 2023-10-27 DIAGNOSIS — R1084 Generalized abdominal pain: Secondary | ICD-10-CM

## 2023-10-27 DIAGNOSIS — K219 Gastro-esophageal reflux disease without esophagitis: Secondary | ICD-10-CM

## 2023-10-27 DIAGNOSIS — K449 Diaphragmatic hernia without obstruction or gangrene: Secondary | ICD-10-CM

## 2023-10-27 NOTE — Telephone Encounter (Unsigned)
 Copied from CRM [id number]. Topic: Clinical - Refused Triage >> Oct 27, 2023  2:21 PM Antwanette L wrote: Patient is having abdominal pain. Patient was seen at [facility] ER yesterday. Patient declined transfer to triage.

## 2023-10-27 NOTE — Telephone Encounter (Signed)
 FYI Only or Action Required?: Action required by provider: update on patient condition.  Patient was last seen in primary care on 09/24/2023 by Sherre Clapper, MD.  Called Nurse Triage reporting No chief complaint on file..  Symptoms began several days ago.  Interventions attempted: Other: See below - IVF, IV Zofran , .  Symptoms are: unchanged.  Triage Disposition: See Within 2 Weeks in Office (overriding See HCP Within 4 Hours (Or PCP Triage))  Patient/caregiver understands and will follow disposition?: Yes  Reason for Disposition  [1] MILD-MODERATE pain AND [2] constant AND [3] age > 60 years  Answer Assessment - Initial Assessment Questions Message received that pt called to f/u from ED visit 10/26/23 where she was seen for abdominal pain and nausea with a dx of hiatal hernia. Tx consisted of IVF, IV zofran  and advised to take protonix , sucralfate  and phenergan  at home. Discharge instructions state: You should come back to the emergency room if you start vomiting and cannot keep anything down, or you develop fevers with your abdominal pain, weakness or confusion. (See ED note).  This RN made contact with patient who stated that she wants to schedule a follow-up. States she continues to experience lower abdominal discomfort at the same level as in the ED, confirmed not worsening. Patient states she is going to rest and then pick up her prescriptions, she has not started them yet.   States someone is calling her at 1600 today to schedule an ultrasound.   Scheduled hospital f/u and advised pt to call back if abdominal pain does not improve and to go to the ED with worsening abdominal pain, vomiting, fever, weakness or confusion per ED d/c notes. Pt verbalized understanding.   1. LOCATION: Where does it hurt?      Lower abdominal pain and nausea  2. RADIATION: Does the pain shoot anywhere else? (e.g., chest, back)     Denies  3. ONSET: When did the pain begin? (e.g., minutes,  hours or days ago)      8 months, worse past 3 days  4. RECURRENT SYMPTOM: Have you ever had this type of stomach pain before? If Yes, ask: When was the last time? and What happened that time?      Yes, chronic x 8 months  5. CAUSE: What do you think is causing the stomach pain? (e.g., gallstones, recent abdominal surgery)     Dx with: GERD, hiatal hernia  6. OTHER SYMPTOMS: Do you have any other symptoms? (e.g., back pain, diarrhea, fever, urination pain, vomiting)       Denies  Protocols used: Abdominal Pain - Female-A-AH Liddell, Antwanette D AL   10/27/23  2:23 PM Unsigned Note Copied from CRM #[id number]. Topic: Clinical - Refused Triage >> Oct 27, 2023  2:21 PM Antwanette L wrote: Patient is having abdominal pain. Patient was seen at [facility] ER yesterday. Patient declined transfer to triage.

## 2023-10-27 NOTE — Telephone Encounter (Deleted)
 Message received that pt called to f/u from ED visit 10/26/23 where she was seen for abdominal pain and nausea with a dx of hiatal hernia. Tx consisted of IVF, IV zofran  and advised to take protonix , sucralfate  and phenergan  at home. Discharge instructions state: You should come back to the emergency room if you start vomiting and cannot keep anything down, or you develop fevers with your abdominal pain, weakness or confusion.  This RN made contact with patient who stated that she wants to schedule a follow-up. States she continues to experience lower abdominal discomfort at the same level as in the ED, confirmed not worsening.

## 2023-10-27 NOTE — Telephone Encounter (Signed)
 Copied from CRM [id number]. Topic: Referral - Question >> Oct 25, 2023 10:14 AM Sophia H wrote: Reason for CRM: Patient states she is trying to get an endoscopy done but for some reason the clinic she was referred to is saying she is missing an authorized referral?  Looks like referral was put in by Dr. Sirivol, referral dates 10/08/2023-10/07/2024. Please reach out and clarify with patient, they will not schedule endoscopy without the authorization. # [phone]  [phone] - Office of Dorn Lauth (provider that patient was referred to) >> Oct 27, 2023  2:21 PM Antwanette L wrote: Patient is requesting a callback about her endoscopy referral. Please contact the patient at [phone]  >> Oct 25, 2023  4:47 PM Winona R wrote: Pt states Lauth Dorn, MD, is no longer taking new pts and would like to know what DR. Cox would like for her to do now.

## 2023-10-27 NOTE — Telephone Encounter (Signed)
 Copied from CRM [id number]. Topic: Clinical - Medical Advice >> Oct 27, 2023  2:24 PM Antwanette L wrote: Reason for CRM: The patient called to inform Dr. Sherre that she was seen at [facility] ER yesterday for abdominal pain.

## 2023-10-28 NOTE — Telephone Encounter (Signed)
Patient called, verbalized understanding.

## 2023-10-29 DIAGNOSIS — R110 Nausea: Secondary | ICD-10-CM | POA: Diagnosis not present

## 2023-10-29 DIAGNOSIS — K760 Fatty (change of) liver, not elsewhere classified: Secondary | ICD-10-CM | POA: Diagnosis not present

## 2023-10-29 DIAGNOSIS — R1084 Generalized abdominal pain: Secondary | ICD-10-CM | POA: Diagnosis not present

## 2023-11-01 ENCOUNTER — Encounter: Payer: Self-pay | Admitting: Family Medicine

## 2023-11-01 ENCOUNTER — Ambulatory Visit (INDEPENDENT_AMBULATORY_CARE_PROVIDER_SITE_OTHER)

## 2023-11-01 ENCOUNTER — Ambulatory Visit: Payer: Self-pay | Admitting: Family Medicine

## 2023-11-01 ENCOUNTER — Telehealth: Payer: Self-pay | Admitting: Oncology

## 2023-11-01 VITALS — BP 118/64 | HR 89 | Temp 98.3°F | Ht 59.0 in | Wt 157.2 lb

## 2023-11-01 DIAGNOSIS — Z23 Encounter for immunization: Secondary | ICD-10-CM | POA: Diagnosis not present

## 2023-11-01 DIAGNOSIS — K219 Gastro-esophageal reflux disease without esophagitis: Secondary | ICD-10-CM

## 2023-11-01 DIAGNOSIS — K449 Diaphragmatic hernia without obstruction or gangrene: Secondary | ICD-10-CM

## 2023-11-01 NOTE — Patient Instructions (Signed)
  VISIT SUMMARY: Today, we discussed your recent nausea and abdominal pain, which worsened after a meal. We reviewed your history of hiatal hernia and diabetes, and your recent hospital visit and tests. We also talked about your well-controlled diabetes and noted a finding of fatty liver, which is not currently causing issues.  YOUR PLAN: ABDOMINAL PAIN AND NAUSEA: You have chronic abdominal pain and nausea, likely related to your hiatal hernia and possibly your diabetes. -Continue taking Nexium  40 mg twice daily. -Continue taking Reglan  (metoclopramide ) as prescribed, before meals. -Eat small, frequent meals and stay hydrated. -Walk for 5-10 minutes after meals. -Complete your swallow study on September 24th. -Consider an endoscopy if symptoms persist.  TYPE 2 DIABETES MELLITUS: Your diabetes is well-controlled with lifestyle changes. -Continue monitoring your blood glucose levels. -Maintain your current lifestyle modifications.  FATTY LIVER: You have a fatty liver, but it is not causing any significant health issues right now. -No specific treatment is needed at this time.                      Contains text generated by [employer]bridge.                                 Contains text generated by [employer]bridge.

## 2023-11-01 NOTE — Assessment & Plan Note (Signed)
 Chronic abdominal pain and nausea, primarily in the epigastric region, with recent exacerbation. Differential includes hiatal hernia and potential gastric motility issues related to diabetes. Recent CT scan showed no bowel obstruction, only mild hiatal hernia. Blood work normal, no infection or anemia. Gallbladder and liver function normal, though fatty liver noted. Hiatal hernia likely contributing to symptoms. Surgical intervention not indicated due to small size. Diabetes may contribute to gastric motility issues, treated with Reglan . - Continue Nexium  40 mg twice daily. - Continue Reglan  (metoclopramide ) as prescribed, before meals. - Encourage small, frequent meals and adequate hydration. - Advise to walk for 5-10 minutes after meals. - Complete swallow study on September 24th. - Consider endoscopy if symptoms persist and a provider is available.

## 2023-11-01 NOTE — Progress Notes (Signed)
 Subjective:  Patient ID: Amy Mccann, female    DOB: 05-09-1940  Age: 83 y.o. MRN: [id number]  Chief Complaint  Patient presents with   Hospitalization Follow-up    HPI: Discussed the use of AI scribe software for clinical note transcription with the patient, who gave verbal consent to proceed.  Discussed the use of AI scribe software for clinical note transcription with the patient, who gave verbal consent to proceed.  History of Present Illness   Amy Mccann is an 83 year old female with a history of hiatal hernia and diabetes who presents with nausea and abdominal pain.  Nausea and abdominal pain - Nausea and abdominal pain began yesterday and persisted throughout the day - Pain is primarily in the middle of the abdomen, currently more on the left side - Symptoms worsened after consuming a meal at the senior center today (hot dog with chili, slaw, and mustard; onions avoided) - No issues with choking on food  Recent hospital evaluation and diagnostic testing - Hospital visit on September 16th for nausea and stomach pain - EKG performed and was normal - CT scan showed mild hiatal hernia, no bowel obstruction - Abdominal ultrasound performed on previous Friday; awaiting results - Swallow test scheduled for Wednesday, September 24th  Hiatal hernia and gastrointestinal management - History of hiatal hernia - Currently taking Nexium  40 mg twice daily - Uses promethazine  for nausea in the morning and sometimes at night if needed - Has taken Reglan  (metoclopramide ), but unsure of details  Diabetes mellitus - History of diabetes managed without medication - Most recent A1c was 6.4 in July  Breast cancer history - History of left-sided breast cancer - Status post left mastectomy, chemotherapy, and radiation - Uses a left-sided prosthesis  Dietary habits and fluid intake - Drinks water, tea, and ginger ale regularly            11/01/2023    1:23 PM 07/07/2023     7:36 AM 03/29/2023    1:48 PM 09/25/2022    9:52 AM 03/05/2022   10:19 AM  Depression screen PHQ 2/9  Decreased Interest 0 0 0 0 0  Down, Depressed, Hopeless 0 0 1 0 0  PHQ - 2 Score 0 0 1 0 0  Altered sleeping   0  0  Tired, decreased energy   1  0  Change in appetite   0  0  Feeling bad or failure about yourself    0  0  Trouble concentrating   0  0  Moving slowly or fidgety/restless   0  0  Suicidal thoughts   0  0  PHQ-9 Score   2  0  Difficult doing work/chores   Not difficult at all          11/01/2023    1:23 PM  Fall Risk   Falls in the past year? 0  Number falls in past yr: 0  Injury with Fall? 0  Risk for fall due to : No Fall Risks  Follow up Falls evaluation completed    Patient Care Team: Sherre Clapper, MD as PCP - General (Family Medicine) Joshua Alm Hamilton, MD as Consulting Physician (Neurosurgery) Mccann, Evalene, MD as Consulting Physician (Unknown Physician Specialty) Cornelius Walburga DEL, MD as Consulting Physician (Oncology) Laurice Grice, OD (Optometry)   Review of Systems  Constitutional:  Negative for chills, fatigue and fever.  HENT:  Negative for congestion, ear pain, sinus pressure and sore throat.   Respiratory:  Negative for cough and shortness of breath.   Cardiovascular:  Negative for chest pain.  Gastrointestinal:  Positive for abdominal pain and nausea. Negative for constipation, diarrhea and vomiting.  Genitourinary:  Negative for dysuria and frequency.  Musculoskeletal:  Negative for arthralgias, back pain and myalgias.  Neurological:  Negative for dizziness and headaches.  Psychiatric/Behavioral:  Negative for dysphoric mood. The patient is not nervous/anxious.     Current Outpatient Medications on File Prior to Visit  Medication Sig Dispense Refill   ACCU-CHEK AVIVA PLUS test strip USE TO CHECK FASTING BLOOD SUGAR ONCE DAILY AS DIRECTED 100 strip 3   Accu-Chek Softclix Lancets lancets USE AS DIRECTED 100 each 5   acetaminophen  (TYLENOL) 500 MG tablet Take 500 mg by mouth every 6 (six) hours as needed.     CALCIUM  PO Take 600 mg by mouth daily.     Cholecalciferol (VITAMIN D3 PO) Take 1,200 mg by mouth daily.     esomeprazole  (NEXIUM ) 40 MG capsule Take 1 capsule (40 mg total) by mouth 2 (two) times daily before a meal. FOR REFLUX 180 capsule 3   levothyroxine  (SYNTHROID ) 112 MCG tablet Take 1 tablet (112 mcg total) by mouth daily before breakfast. FOR HYPOTHYROIDISM. 90 tablet 1   losartan  (COZAAR ) 25 MG tablet Take 1 tablet (25 mg total) by mouth daily. FOR HIGH BP 90 tablet 1   metoCLOPramide  (REGLAN ) 10 MG tablet Take 1 tablet (10 mg total) by mouth 4 (four) times daily -  before meals and at bedtime. FOR IMPROVING STOMACH EMPTYING. 120 tablet 1   promethazine  (PHENERGAN ) 25 MG tablet Take 1 tablet (25 mg total) by mouth every 8 (eight) hours as needed. NAUSEA AND/OR VOMITING 30 tablet 2   psyllium (METAMUCIL) 58.6 % packet Take 1 packet by mouth daily as needed. FOR CONSTIPATION     rosuvastatin  (CRESTOR ) 5 MG tablet Take 1 tablet (5 mg total) by mouth daily. FOR HIGH CHOLESTEROL 90 tablet 1   sucralfate  (CARAFATE ) 1 GM/10ML suspension Take 10 mLs (1 g total) by mouth 3 (three) times daily before meals. For hiatal hernia/stomach inflammation 473 mL 1   No current facility-administered medications on file prior to visit.   Past Medical History:  Diagnosis Date   Age-related osteoporosis without current pathological fracture 07/03/2019   Cancer [facility]) 1990   Left Breast Cancer    Cancer (HCC) 2017   Thyroid  Cancer   Cervicalgia 07/03/2019   Diabetes mellitus without complication (HCC)    Generalized anxiety disorder    GERD (gastroesophageal reflux disease)    Hypertension    Low back pain with sciatica 07/03/2019   Obesity due to excess calories 07/03/2019   S/P repair of paraesophageal hernia 10/20/2013   S/P TKR (total knee replacement) 07/25/2012   Past Surgical History:  Procedure Laterality Date   24 HOUR  PH STUDY N/A 08/07/2013   Procedure: 24 HOUR PH STUDY;  Surgeon: Norleen JAYSON Hint, MD;  Location: [facility];  Service: Endoscopy;  Laterality: N/A;   APPENDECTOMY  03/17/2016   ESOPHAGEAL MANOMETRY N/A 08/07/2013   Procedure: ESOPHAGEAL MANOMETRY (EM);  Surgeon: Norleen JAYSON Hint, MD;  Location: [facility];  Service: Endoscopy;  Laterality: N/A;   HERNIA SURGERY  2015   HIATAL HERNIA SURGERY (FUNDOPLICATION.)   JOINT REPLACEMENT Right    right knee   MASTECTOMY Left 1990   THYROIDECTOMY Bilateral 08/2015    Family History  Problem Relation Age of Onset   Leukemia Brother    Social History  Socioeconomic History   Marital status: Divorced    Spouse name: Not on file   Number of children: 0   Years of education: Not on file   Highest education level: Not on file  Occupational History   Occupation: Retired from [employer]  Tobacco Use   Smoking status: Never    Passive exposure: Never   Smokeless tobacco: Never  Vaping Use   Vaping status: Never Used  Substance and Sexual Activity   Alcohol use: Never   Drug use: Never   Sexual activity: Not Currently  Other Topics Concern   Not on file  Social History Narrative   One sister lives near by, the other sister lives in [place]    Social Drivers of Health   Financial Resource Strain: Low Risk  (03/29/2023)   Overall Financial Resource Strain (CARDIA)    Difficulty of Paying Living Expenses: Not hard at all  Food Insecurity: No Food Insecurity (03/29/2023)   Hunger Vital Sign    Worried About Running Out of Food in the Last Year: Never true    Ran Out of Food in the Last Year: Never true  Transportation Needs: No Transportation Needs (03/29/2023)   PRAPARE - Administrator, Civil Service (Medical): No    Lack of Transportation (Non-Medical): No  Physical Activity: Insufficiently Active (03/29/2023)   Exercise Vital Sign    Days of Exercise per Week: 7 days    Minutes of Exercise per Session: 20 min  Stress: No Stress  Concern Present (03/29/2023)   [employer] of Occupational Health - Occupational Stress Questionnaire    Feeling of Stress : Not at all  Social Connections: Moderately Integrated (03/29/2023)   Social Connection and Isolation Panel    Frequency of Communication with Friends and Family: More than three times a week    Frequency of Social Gatherings with Friends and Family: More than three times a week    Attends Religious Services: More than 4 times per year    Active Member of [employer] or Organizations: No    Attends Engineer, structural: More than 4 times per year    Marital Status: Divorced    Objective:  BP 118/64   Pulse 89   Temp 98.3 F (36.8 C)   Ht 4' 11 (1.499 m)   Wt 157 lb 3.2 oz (71.3 kg)   SpO2 98%   BMI 31.75 kg/m      11/01/2023    1:19 PM 09/24/2023   10:51 AM 09/08/2023    9:32 AM  BP/Weight  Systolic BP 118 110 116  Diastolic BP 64 60 62  Wt. (Lbs) 157.2 155 159  BMI 31.75 kg/m2 31.31 kg/m2 32.11 kg/m2    Physical Exam Vitals and nursing note reviewed.  Constitutional:      Appearance: She is obese.  HENT:     Head: Normocephalic and atraumatic.  Cardiovascular:     Rate and Rhythm: Normal rate and regular rhythm.  Pulmonary:     Effort: Pulmonary effort is normal.     Breath sounds: Normal breath sounds.  Abdominal:     General: Bowel sounds are normal. There is no distension.     Tenderness: There is abdominal tenderness (mild epigastric tenderness).  Musculoskeletal:        General: Normal range of motion.  Skin:    General: Skin is warm.  Neurological:     General: No focal deficit present.     Mental Status: She is alert  and oriented to person, place, and time.  Psychiatric:        Mood and Affect: Mood normal.         Lab Results  Component Value Date   WBC 9.3 08/27/2023   HGB 13.5 08/27/2023   HCT 40.9 08/27/2023   PLT 273 08/27/2023   GLUCOSE 116 (H) 08/27/2023   CHOL 165 08/27/2023   TRIG 175 (H) 08/27/2023    HDL 59 08/27/2023   LDLCALC 77 08/27/2023   ALT 15 08/27/2023   AST 18 08/27/2023   NA 135 08/27/2023   K 4.6 08/27/2023   CL 95 (L) 08/27/2023   CREATININE 0.87 08/27/2023   BUN 9 08/27/2023   CO2 22 08/27/2023   TSH 2.980 05/26/2023   INR 0.99 01/21/2010   HGBA1C 6.4 (H) 08/27/2023    Results for orders placed or performed in visit on 08/27/23  Hemoglobin A1c   Collection Time: 08/27/23 10:02 AM  Result Value Ref Range   Hgb A1c MFr Bld 6.4 (H) 4.8 - 5.6 %   Est. average glucose Bld gHb Est-mCnc 137 mg/dL  Lipid panel   Collection Time: 08/27/23 10:02 AM  Result Value Ref Range   Cholesterol, Total 165 100 - 199 mg/dL   Triglycerides 824 (H) 0 - 149 mg/dL   HDL 59 >60 mg/dL   VLDL Cholesterol Cal 29 5 - 40 mg/dL   LDL Chol Calc (NIH) 77 0 - 99 mg/dL   Chol/HDL Ratio 2.8 0.0 - 4.4 ratio  Comprehensive metabolic panel with GFR   Collection Time: 08/27/23 10:02 AM  Result Value Ref Range   Glucose 116 (H) 70 - 99 mg/dL   BUN 9 8 - 27 mg/dL   Creatinine, Ser 9.12 0.57 - 1.00 mg/dL   eGFR 66 >40 fO/fpw/8.26   BUN/Creatinine Ratio 10 (L) 12 - 28   Sodium 135 134 - 144 mmol/L   Potassium 4.6 3.5 - 5.2 mmol/L   Chloride 95 (L) 96 - 106 mmol/L   CO2 22 20 - 29 mmol/L   Calcium  10.0 8.7 - 10.3 mg/dL   Total Protein 7.1 6.0 - 8.5 g/dL   Albumin 4.3 3.7 - 4.7 g/dL   Globulin, Total 2.8 1.5 - 4.5 g/dL   Bilirubin Total 0.5 0.0 - 1.2 mg/dL   Alkaline Phosphatase 131 (H) 44 - 121 IU/L   AST 18 0 - 40 IU/L   ALT 15 0 - 32 IU/L  CBC with Differential/Platelet   Collection Time: 08/27/23 10:02 AM  Result Value Ref Range   WBC 9.3 3.4 - 10.8 x10E3/uL   RBC 4.15 3.77 - 5.28 x10E6/uL   Hemoglobin 13.5 11.1 - 15.9 g/dL   Hematocrit 59.0 65.9 - 46.6 %   MCV 99 (H) 79 - 97 fL   MCH 32.5 26.6 - 33.0 pg   MCHC 33.0 31.5 - 35.7 g/dL   RDW 87.1 88.2 - 84.5 %   Platelets 273 150 - 450 x10E3/uL   Neutrophils 74 Not Estab. %   Lymphs 17 Not Estab. %   Monocytes 7 Not Estab. %    Eos 1 Not Estab. %   Basos 1 Not Estab. %   Neutrophils Absolute 6.9 1.4 - 7.0 x10E3/uL   Lymphocytes Absolute 1.6 0.7 - 3.1 x10E3/uL   Monocytes Absolute 0.6 0.1 - 0.9 x10E3/uL   EOS (ABSOLUTE) 0.1 0.0 - 0.4 x10E3/uL   Basophils Absolute 0.1 0.0 - 0.2 x10E3/uL   Immature Granulocytes 0 Not Estab. %  Immature Grans (Abs) 0.0 0.0 - 0.1 x10E3/uL  .  Assessment & Plan:   Assessment & Plan Encounter for immunization  Orders:   Flu vaccine HIGH DOSE PF(Fluzone Trivalent)  Gastroesophageal reflux disease with hiatal hernia Chronic abdominal pain and nausea, primarily in the epigastric region, with recent exacerbation. Differential includes hiatal hernia and potential gastric motility issues related to diabetes. Recent CT scan showed no bowel obstruction, only mild hiatal hernia. Blood work normal, no infection or anemia. Gallbladder and liver function normal, though fatty liver noted. Hiatal hernia likely contributing to symptoms. Surgical intervention not indicated due to small size. Diabetes may contribute to gastric motility issues, treated with Reglan . - Continue Nexium  40 mg twice daily. - Continue Reglan  (metoclopramide ) as prescribed, before meals. - Encourage small, frequent meals and adequate hydration. - Advise to walk for 5-10 minutes after meals. - Complete swallow study on September 24th. - Consider endoscopy if symptoms persist and a provider is available.    Type 2 diabetes mellitus controlled with complications of neuropathy and possible gastroparesis? Type 2 diabetes mellitus, well-controlled with lifestyle modifications. Recent HbA1c was 6.4%. - Continue monitoring blood glucose levels. - continue metoclopramide  - Maintain current lifestyle modifications.  Fatty liver Fatty liver identified, not currently causing significant health issues. Liver function tests are normal.          Body mass index is 31.75 kg/m.  Assessment and Plan      No orders of the  defined types were placed in this encounter.   Orders Placed This Encounter  Procedures   Flu vaccine HIGH DOSE PF(Fluzone Trivalent)       Follow-up: No follow-ups on file.  An After Visit Summary was printed and given to the patient.  Shishir Krantz, MD [facility] [phone]

## 2023-11-01 NOTE — Telephone Encounter (Signed)
 11/01/23 Spoke with patient and rescheduled appts.

## 2023-11-01 NOTE — Assessment & Plan Note (Signed)
 Chronic abdominal pain and nausea, primarily in the epigastric region, with recent exacerbation. Differential includes hiatal hernia and potential gastric motility issues related to diabetes. Recent CT scan showed no bowel obstruction, only mild hiatal hernia. Blood work normal, no infection or anemia. Gallbladder and liver function normal, though fatty liver noted. Hiatal hernia likely contributing to symptoms. Surgical intervention not indicated due to small size. Diabetes may contribute to gastric motility issues, treated with Reglan . - Continue Nexium  40 mg twice daily. - Continue Reglan  (metoclopramide ) as prescribed, before meals. - Encourage small, frequent meals and adequate hydration. - Advise to walk for 5-10 minutes after meals. - Complete swallow study on September 24th. - Consider endoscopy if symptoms persist and a provider is available.    Type 2 diabetes mellitus controlled with complications of neuropathy and possible gastroparesis? Type 2 diabetes mellitus, well-controlled with lifestyle modifications. Recent HbA1c was 6.4%. - Continue monitoring blood glucose levels. - continue metoclopramide  - Maintain current lifestyle modifications.  Fatty liver Fatty liver identified, not currently causing significant health issues. Liver function tests are normal.

## 2023-11-02 ENCOUNTER — Other Ambulatory Visit: Payer: Self-pay

## 2023-11-02 DIAGNOSIS — K219 Gastro-esophageal reflux disease without esophagitis: Secondary | ICD-10-CM

## 2023-11-02 DIAGNOSIS — R1084 Generalized abdominal pain: Secondary | ICD-10-CM

## 2023-11-02 DIAGNOSIS — R1011 Right upper quadrant pain: Secondary | ICD-10-CM

## 2023-11-03 ENCOUNTER — Inpatient Hospital Stay: Payer: Medicare HMO | Admitting: Oncology

## 2023-11-03 DIAGNOSIS — R110 Nausea: Secondary | ICD-10-CM | POA: Diagnosis not present

## 2023-11-03 DIAGNOSIS — K449 Diaphragmatic hernia without obstruction or gangrene: Secondary | ICD-10-CM | POA: Diagnosis not present

## 2023-11-03 DIAGNOSIS — K219 Gastro-esophageal reflux disease without esophagitis: Secondary | ICD-10-CM | POA: Diagnosis not present

## 2023-11-04 ENCOUNTER — Ambulatory Visit: Payer: Self-pay | Admitting: Family Medicine

## 2023-11-04 DIAGNOSIS — K30 Functional dyspepsia: Secondary | ICD-10-CM | POA: Diagnosis not present

## 2023-11-04 DIAGNOSIS — K219 Gastro-esophageal reflux disease without esophagitis: Secondary | ICD-10-CM | POA: Diagnosis not present

## 2023-11-08 DIAGNOSIS — K449 Diaphragmatic hernia without obstruction or gangrene: Secondary | ICD-10-CM | POA: Diagnosis not present

## 2023-11-12 DIAGNOSIS — H04123 Dry eye syndrome of bilateral lacrimal glands: Secondary | ICD-10-CM | POA: Diagnosis not present

## 2023-11-12 DIAGNOSIS — E119 Type 2 diabetes mellitus without complications: Secondary | ICD-10-CM | POA: Diagnosis not present

## 2023-11-12 DIAGNOSIS — H353131 Nonexudative age-related macular degeneration, bilateral, early dry stage: Secondary | ICD-10-CM | POA: Diagnosis not present

## 2023-11-16 ENCOUNTER — Other Ambulatory Visit: Payer: Self-pay | Admitting: Family Medicine

## 2023-11-16 DIAGNOSIS — K219 Gastro-esophageal reflux disease without esophagitis: Secondary | ICD-10-CM

## 2023-11-17 DIAGNOSIS — R1030 Lower abdominal pain, unspecified: Secondary | ICD-10-CM | POA: Diagnosis not present

## 2023-11-17 DIAGNOSIS — K449 Diaphragmatic hernia without obstruction or gangrene: Secondary | ICD-10-CM | POA: Diagnosis not present

## 2023-11-23 ENCOUNTER — Inpatient Hospital Stay: Admitting: Oncology

## 2023-12-07 NOTE — Progress Notes (Unsigned)
 Subjective:  Patient ID: Amy Mccann, female    DOB: 01-Dec-1940  Age: 83 y.o. MRN: [id number]  No chief complaint on file.   HPI: Discussed the use of AI scribe software for clinical note transcription with the patient, who gave verbal consent to proceed.  History of Present Illness        11/01/2023    1:23 PM 07/07/2023    7:36 AM 03/29/2023    1:48 PM 09/25/2022    9:52 AM 03/05/2022   10:19 AM  Depression screen PHQ 2/9  Decreased Interest 0 0 0 0 0  Down, Depressed, Hopeless 0 0 1 0 0  PHQ - 2 Score 0 0 1 0 0  Altered sleeping   0  0  Tired, decreased energy   1  0  Change in appetite   0  0  Feeling bad or failure about yourself    0  0  Trouble concentrating   0  0  Moving slowly or fidgety/restless   0  0  Suicidal thoughts   0  0  PHQ-9 Score   2  0  Difficult doing work/chores   Not difficult at all          11/01/2023    1:23 PM  Fall Risk   Falls in the past year? 0  Number falls in past yr: 0  Injury with Fall? 0  Risk for fall due to : No Fall Risks  Follow up Falls evaluation completed    Patient Care Team: Sherre Clapper, MD as PCP - General (Family Medicine) Joshua Alm Hamilton, MD as Consulting Physician (Neurosurgery) Misenheimer, Evalene, MD as Consulting Physician (Unknown Physician Specialty) Cornelius Jailin DEL, MD as Consulting Physician (Oncology) Laurice Grice, OD (Optometry)   Review of Systems  Current Outpatient Medications on File Prior to Visit  Medication Sig Dispense Refill  . ACCU-CHEK AVIVA PLUS test strip USE TO CHECK FASTING BLOOD SUGAR ONCE DAILY AS DIRECTED 100 strip 3  . Accu-Chek Softclix Lancets lancets USE AS DIRECTED 100 each 5  . acetaminophen (TYLENOL) 500 MG tablet Take 500 mg by mouth every 6 (six) hours as needed.    . CALCIUM  PO Take 600 mg by mouth daily.    . Cholecalciferol (VITAMIN D3 PO) Take 1,200 mg by mouth daily.    . esomeprazole  (NEXIUM ) 40 MG capsule Take 1 capsule (40 mg total) by mouth 2 (two)  times daily before a meal. FOR REFLUX 180 capsule 3  . levothyroxine  (SYNTHROID ) 112 MCG tablet Take 1 tablet (112 mcg total) by mouth daily before breakfast. FOR HYPOTHYROIDISM. 90 tablet 1  . losartan  (COZAAR ) 25 MG tablet Take 1 tablet (25 mg total) by mouth daily. FOR HIGH BP 90 tablet 1  . metoCLOPramide  (REGLAN ) 10 MG tablet Take 1 tablet (10 mg total) by mouth 4 (four) times daily -  before meals and at bedtime. FOR IMPROVING STOMACH EMPTYING. 120 tablet 1  . promethazine  (PHENERGAN ) 25 MG tablet Take 1 tablet (25 mg total) by mouth every 8 (eight) hours as needed. NAUSEA AND/OR VOMITING 30 tablet 2  . psyllium (METAMUCIL) 58.6 % packet Take 1 packet by mouth daily as needed. FOR CONSTIPATION    . rosuvastatin  (CRESTOR ) 5 MG tablet Take 1 tablet (5 mg total) by mouth daily. FOR HIGH CHOLESTEROL 90 tablet 1  . sucralfate  (CARAFATE ) 1 GM/10ML suspension TAKE 10 MLS BY MOUTH THREE TIMES DAILY BEFORE MEALS FOR HIATAL HERNIA/ STOMACH INFLAMMATION 473 mL 1  No current facility-administered medications on file prior to visit.   Past Medical History:  Diagnosis Date  . Age-related osteoporosis without current pathological fracture 07/03/2019  . Cancer [facility]) 1990   Left Breast Cancer   . Cancer (HCC) 2017   Thyroid  Cancer  . Cervicalgia 07/03/2019  . Diabetes mellitus without complication (HCC)   . Generalized anxiety disorder   . GERD (gastroesophageal reflux disease)   . Hypertension   . Low back pain with sciatica 07/03/2019  . Obesity due to excess calories 07/03/2019  . S/P repair of paraesophageal hernia 10/20/2013  . S/P TKR (total knee replacement) 07/25/2012   Past Surgical History:  Procedure Laterality Date  . 24 HOUR PH STUDY N/A 08/07/2013   Procedure: 24 HOUR PH STUDY;  Surgeon: Norleen JAYSON Hint, MD;  Location: [facility];  Service: Endoscopy;  Laterality: N/A;  . APPENDECTOMY  03/17/2016  . ESOPHAGEAL MANOMETRY N/A 08/07/2013   Procedure: ESOPHAGEAL MANOMETRY (EM);  Surgeon:  Norleen JAYSON Hint, MD;  Location: [facility];  Service: Endoscopy;  Laterality: N/A;  . HERNIA SURGERY  2015   HIATAL HERNIA SURGERY (FUNDOPLICATION.)  . JOINT REPLACEMENT Right    right knee  . MASTECTOMY Left 1990  . THYROIDECTOMY Bilateral 08/2015    Family History  Problem Relation Age of Onset  . Leukemia Brother    Social History   Socioeconomic History  . Marital status: Divorced    Spouse name: Not on file  . Number of children: 0  . Years of education: Not on file  . Highest education level: Not on file  Occupational History  . Occupation: Retired from [employer]  . Smoking status: Never    Passive exposure: Never  . Smokeless tobacco: Never  Vaping Use  . Vaping status: Never Used  Substance and Sexual Activity  . Alcohol use: Never  . Drug use: Never  . Sexual activity: Not Currently  Other Topics Concern  . Not on file  Social History Narrative   One sister lives near by, the other sister lives in [facility]    Social Drivers of Health   Financial Resource Strain: Low Risk  (03/29/2023)   Overall Financial Resource Strain (CARDIA)   . Difficulty of Paying Living Expenses: Not hard at all  Food Insecurity: No Food Insecurity (03/29/2023)   Hunger Vital Sign   . Worried About Programme Researcher, Broadcasting/film/video in the Last Year: Never true   . Ran Out of Food in the Last Year: Never true  Transportation Needs: No Transportation Needs (03/29/2023)   PRAPARE - Transportation   . Lack of Transportation (Medical): No   . Lack of Transportation (Non-Medical): No  Physical Activity: Insufficiently Active (03/29/2023)   Exercise Vital Sign   . Days of Exercise per Week: 7 days   . Minutes of Exercise per Session: 20 min  Stress: No Stress Concern Present (03/29/2023)   [employer] of Occupational Health - Occupational Stress Questionnaire   . Feeling of Stress : Not at all  Social Connections: Moderately Integrated (03/29/2023)   Social Connection and Isolation Panel   .  Frequency of Communication with Friends and Family: More than three times a week   . Frequency of Social Gatherings with Friends and Family: More than three times a week   . Attends Religious Services: More than 4 times per year   . Active Member of Clubs or Organizations: No   . Attends Banker Meetings: More than 4 times per  year   . Marital Status: Divorced    Objective:  There were no vitals taken for this visit.     11/01/2023    1:19 PM 09/24/2023   10:51 AM 09/08/2023    9:32 AM  BP/Weight  Systolic BP 118 110 116  Diastolic BP 64 60 62  Wt. (Lbs) 157.2 155 159  BMI 31.75 kg/m2 31.31 kg/m2 32.11 kg/m2    Physical Exam  {Perform Simple Foot Exam  Perform Detailed exam:1} {Insert foot Exam (Optional):30965}   Lab Results  Component Value Date   WBC 9.3 08/27/2023   HGB 13.5 08/27/2023   HCT 40.9 08/27/2023   PLT 273 08/27/2023   GLUCOSE 116 (H) 08/27/2023   CHOL 165 08/27/2023   TRIG 175 (H) 08/27/2023   HDL 59 08/27/2023   LDLCALC 77 08/27/2023   ALT 15 08/27/2023   AST 18 08/27/2023   NA 135 08/27/2023   K 4.6 08/27/2023   CL 95 (L) 08/27/2023   CREATININE 0.87 08/27/2023   BUN 9 08/27/2023   CO2 22 08/27/2023   TSH 2.980 05/26/2023   INR 0.99 01/21/2010   HGBA1C 6.4 (H) 08/27/2023    Results for orders placed or performed in visit on 08/27/23  Hemoglobin A1c   Collection Time: 08/27/23 10:02 AM  Result Value Ref Range   Hgb A1c MFr Bld 6.4 (H) 4.8 - 5.6 %   Est. average glucose Bld gHb Est-mCnc 137 mg/dL  Lipid panel   Collection Time: 08/27/23 10:02 AM  Result Value Ref Range   Cholesterol, Total 165 100 - 199 mg/dL   Triglycerides 824 (H) 0 - 149 mg/dL   HDL 59 >60 mg/dL   VLDL Cholesterol Cal 29 5 - 40 mg/dL   LDL Chol Calc (NIH) 77 0 - 99 mg/dL   Chol/HDL Ratio 2.8 0.0 - 4.4 ratio  Comprehensive metabolic panel with GFR   Collection Time: 08/27/23 10:02 AM  Result Value Ref Range   Glucose 116 (H) 70 - 99 mg/dL   BUN 9 8 -  27 mg/dL   Creatinine, Ser 9.12 0.57 - 1.00 mg/dL   eGFR 66 >40 fO/fpw/8.26   BUN/Creatinine Ratio 10 (L) 12 - 28   Sodium 135 134 - 144 mmol/L   Potassium 4.6 3.5 - 5.2 mmol/L   Chloride 95 (L) 96 - 106 mmol/L   CO2 22 20 - 29 mmol/L   Calcium  10.0 8.7 - 10.3 mg/dL   Total Protein 7.1 6.0 - 8.5 g/dL   Albumin 4.3 3.7 - 4.7 g/dL   Globulin, Total 2.8 1.5 - 4.5 g/dL   Bilirubin Total 0.5 0.0 - 1.2 mg/dL   Alkaline Phosphatase 131 (H) 44 - 121 IU/L   AST 18 0 - 40 IU/L   ALT 15 0 - 32 IU/L  CBC with Differential/Platelet   Collection Time: 08/27/23 10:02 AM  Result Value Ref Range   WBC 9.3 3.4 - 10.8 x10E3/uL   RBC 4.15 3.77 - 5.28 x10E6/uL   Hemoglobin 13.5 11.1 - 15.9 g/dL   Hematocrit 59.0 65.9 - 46.6 %   MCV 99 (H) 79 - 97 fL   MCH 32.5 26.6 - 33.0 pg   MCHC 33.0 31.5 - 35.7 g/dL   RDW 87.1 88.2 - 84.5 %   Platelets 273 150 - 450 x10E3/uL   Neutrophils 74 Not Estab. %   Lymphs 17 Not Estab. %   Monocytes 7 Not Estab. %   Eos 1 Not Estab. %   Basos  1 Not Estab. %   Neutrophils Absolute 6.9 1.4 - 7.0 x10E3/uL   Lymphocytes Absolute 1.6 0.7 - 3.1 x10E3/uL   Monocytes Absolute 0.6 0.1 - 0.9 x10E3/uL   EOS (ABSOLUTE) 0.1 0.0 - 0.4 x10E3/uL   Basophils Absolute 0.1 0.0 - 0.2 x10E3/uL   Immature Granulocytes 0 Not Estab. %   Immature Grans (Abs) 0.0 0.0 - 0.1 x10E3/uL  .  Assessment & Plan:   Assessment & Plan Hypertension complicating diabetes (HCC)       There is no height or weight on file to calculate BMI.  Assessment and Plan Assessment & Plan      No orders of the defined types were placed in this encounter.   No orders of the defined types were placed in this encounter.      Follow-up: No follow-ups on file.  An After Visit Summary was printed and given to the patient.  Abigail Free, MD [facility] [phone]

## 2023-12-08 ENCOUNTER — Ambulatory Visit (INDEPENDENT_AMBULATORY_CARE_PROVIDER_SITE_OTHER): Admitting: Family Medicine

## 2023-12-08 VITALS — BP 130/74 | HR 87 | Temp 98.2°F | Ht 59.0 in | Wt 159.0 lb

## 2023-12-08 DIAGNOSIS — E039 Hypothyroidism, unspecified: Secondary | ICD-10-CM

## 2023-12-08 DIAGNOSIS — E782 Mixed hyperlipidemia: Secondary | ICD-10-CM | POA: Diagnosis not present

## 2023-12-08 DIAGNOSIS — E1143 Type 2 diabetes mellitus with diabetic autonomic (poly)neuropathy: Secondary | ICD-10-CM

## 2023-12-08 DIAGNOSIS — K219 Gastro-esophageal reflux disease without esophagitis: Secondary | ICD-10-CM | POA: Diagnosis not present

## 2023-12-08 DIAGNOSIS — K3184 Gastroparesis: Secondary | ICD-10-CM | POA: Diagnosis not present

## 2023-12-08 DIAGNOSIS — E119 Type 2 diabetes mellitus without complications: Secondary | ICD-10-CM

## 2023-12-08 DIAGNOSIS — I10 Essential (primary) hypertension: Secondary | ICD-10-CM

## 2023-12-08 LAB — POCT GLYCOSYLATED HEMOGLOBIN (HGB A1C): HbA1c POC (<> result, manual entry): 6.1 % (ref 4.0–5.6)

## 2023-12-08 LAB — POCT LIPID PANEL
HDL: 60
LDL: 93
Non-HDL: 124
TC/HDL: 1.6
TRG: 154

## 2023-12-10 NOTE — Assessment & Plan Note (Addendum)
 Previously well controlled Continue Synthroid at current dose

## 2023-12-10 NOTE — Assessment & Plan Note (Addendum)
-   Continue Nexium  40 mg twice a day. - Continue sucralfate  three times a day before meals. - Keep appointment with Doctor Meisenheimer.

## 2023-12-10 NOTE — Assessment & Plan Note (Addendum)
 Diabetes well controlled.  Delayed gastric emptying confirmed by gastric emptying study. Symptoms include nausea and bloating. Some relief with current medication regimen. Encouraged small meals - Continue metoclopramide  (Reglan ) before meals and at bedtime. - Continue Nexium  40 mg twice a day. - Continue sucralfate  three times a day before meals. - Keep appointment with Doctor Meisenheimer.   Orders:   POCT glycosylated hemoglobin (Hb A1C)

## 2023-12-10 NOTE — Assessment & Plan Note (Addendum)
 Well controlled.  No changes to medicines. Continue Rosuvastatin  5 mg take 1 tablet daily. Continue to work on eating a healthy diet and exercise.  .  Orders:   POCT Lipid Panel

## 2023-12-10 NOTE — Assessment & Plan Note (Addendum)
 Well controlled.  No changes to medicines. Continue losartan  25 mg daily.  Continue to work on eating a healthy diet and exercise.  Labs reviewed

## 2023-12-16 ENCOUNTER — Encounter: Payer: Self-pay | Admitting: Oncology

## 2023-12-16 ENCOUNTER — Telehealth: Payer: Self-pay | Admitting: Oncology

## 2023-12-16 ENCOUNTER — Inpatient Hospital Stay: Attending: Oncology | Admitting: Oncology

## 2023-12-16 VITALS — BP 130/74 | HR 90 | Temp 97.8°F | Resp 18 | Ht 59.0 in | Wt 162.4 lb

## 2023-12-16 DIAGNOSIS — K7689 Other specified diseases of liver: Secondary | ICD-10-CM | POA: Insufficient documentation

## 2023-12-16 DIAGNOSIS — Z9049 Acquired absence of other specified parts of digestive tract: Secondary | ICD-10-CM | POA: Diagnosis not present

## 2023-12-16 DIAGNOSIS — S2001XA Contusion of right breast, initial encounter: Secondary | ICD-10-CM | POA: Diagnosis not present

## 2023-12-16 DIAGNOSIS — M85832 Other specified disorders of bone density and structure, left forearm: Secondary | ICD-10-CM | POA: Insufficient documentation

## 2023-12-16 DIAGNOSIS — E890 Postprocedural hypothyroidism: Secondary | ICD-10-CM | POA: Insufficient documentation

## 2023-12-16 DIAGNOSIS — K449 Diaphragmatic hernia without obstruction or gangrene: Secondary | ICD-10-CM | POA: Insufficient documentation

## 2023-12-16 DIAGNOSIS — K760 Fatty (change of) liver, not elsewhere classified: Secondary | ICD-10-CM | POA: Insufficient documentation

## 2023-12-16 DIAGNOSIS — M85852 Other specified disorders of bone density and structure, left thigh: Secondary | ICD-10-CM | POA: Insufficient documentation

## 2023-12-16 DIAGNOSIS — Z880 Allergy status to penicillin: Secondary | ICD-10-CM | POA: Insufficient documentation

## 2023-12-16 DIAGNOSIS — Z806 Family history of leukemia: Secondary | ICD-10-CM | POA: Insufficient documentation

## 2023-12-16 DIAGNOSIS — M810 Age-related osteoporosis without current pathological fracture: Secondary | ICD-10-CM | POA: Diagnosis not present

## 2023-12-16 DIAGNOSIS — R140 Abdominal distension (gaseous): Secondary | ICD-10-CM | POA: Diagnosis not present

## 2023-12-16 DIAGNOSIS — M2550 Pain in unspecified joint: Secondary | ICD-10-CM | POA: Diagnosis not present

## 2023-12-16 DIAGNOSIS — Z853 Personal history of malignant neoplasm of breast: Secondary | ICD-10-CM | POA: Insufficient documentation

## 2023-12-16 DIAGNOSIS — M85851 Other specified disorders of bone density and structure, right thigh: Secondary | ICD-10-CM | POA: Diagnosis not present

## 2023-12-16 DIAGNOSIS — K3184 Gastroparesis: Secondary | ICD-10-CM | POA: Insufficient documentation

## 2023-12-16 DIAGNOSIS — E119 Type 2 diabetes mellitus without complications: Secondary | ICD-10-CM | POA: Diagnosis not present

## 2023-12-16 DIAGNOSIS — Z885 Allergy status to narcotic agent status: Secondary | ICD-10-CM | POA: Insufficient documentation

## 2023-12-16 DIAGNOSIS — F411 Generalized anxiety disorder: Secondary | ICD-10-CM | POA: Diagnosis not present

## 2023-12-16 DIAGNOSIS — M8580 Other specified disorders of bone density and structure, unspecified site: Secondary | ICD-10-CM | POA: Diagnosis not present

## 2023-12-16 DIAGNOSIS — I10 Essential (primary) hypertension: Secondary | ICD-10-CM | POA: Insufficient documentation

## 2023-12-16 DIAGNOSIS — Z85850 Personal history of malignant neoplasm of thyroid: Secondary | ICD-10-CM | POA: Insufficient documentation

## 2023-12-16 DIAGNOSIS — Z79899 Other long term (current) drug therapy: Secondary | ICD-10-CM | POA: Diagnosis not present

## 2023-12-16 DIAGNOSIS — Z9012 Acquired absence of left breast and nipple: Secondary | ICD-10-CM | POA: Diagnosis not present
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
## 2023-12-16 NOTE — Telephone Encounter (Signed)
 Patient has been scheduled for follow-up visit per 12/16/23 LOS.  Pt given an appt calendar with date and time.

## 2023-12-24 ENCOUNTER — Other Ambulatory Visit: Payer: Self-pay | Admitting: Family Medicine

## 2023-12-24 DIAGNOSIS — R110 Nausea: Secondary | ICD-10-CM

## 2023-12-24 DIAGNOSIS — K219 Gastro-esophageal reflux disease without esophagitis: Secondary | ICD-10-CM

## 2023-12-24 DIAGNOSIS — K449 Diaphragmatic hernia without obstruction or gangrene: Secondary | ICD-10-CM

## 2023-12-27 ENCOUNTER — Other Ambulatory Visit: Payer: Self-pay | Admitting: Family Medicine

## 2023-12-27 DIAGNOSIS — R110 Nausea: Secondary | ICD-10-CM

## 2023-12-30 DIAGNOSIS — M216X2 Other acquired deformities of left foot: Secondary | ICD-10-CM | POA: Diagnosis not present

## 2023-12-30 DIAGNOSIS — M79672 Pain in left foot: Secondary | ICD-10-CM | POA: Diagnosis not present

## 2024-01-03 ENCOUNTER — Other Ambulatory Visit: Payer: Self-pay | Admitting: Family Medicine

## 2024-01-03 DIAGNOSIS — K219 Gastro-esophageal reflux disease without esophagitis: Secondary | ICD-10-CM

## 2024-02-18 ENCOUNTER — Other Ambulatory Visit: Payer: Self-pay | Admitting: Family Medicine

## 2024-02-18 DIAGNOSIS — K219 Gastro-esophageal reflux disease without esophagitis: Secondary | ICD-10-CM

## 2024-02-21 ENCOUNTER — Other Ambulatory Visit: Payer: Self-pay | Admitting: Family Medicine

## 2024-02-21 DIAGNOSIS — K219 Gastro-esophageal reflux disease without esophagitis: Secondary | ICD-10-CM

## 2024-02-21 DIAGNOSIS — K449 Diaphragmatic hernia without obstruction or gangrene: Secondary | ICD-10-CM

## 2024-02-21 DIAGNOSIS — R110 Nausea: Secondary | ICD-10-CM

## 2024-02-24 ENCOUNTER — Telehealth: Payer: Self-pay

## 2024-02-24 NOTE — Telephone Encounter (Signed)
 Copied from CRM [id number]. Topic: Clinical - Medication Question >> Feb 24, 2024 10:01 AM Joesph NOVAK wrote: Reason for CRM: patient has been taking Nausea pills, has not been nausea in 3-4 weeks. She wants to know if she should continue taking it or discontinue it..   metoCLOPramide  (REGLAN ) 10 MG tablet [[id number]]

## 2024-02-25 ENCOUNTER — Telehealth: Payer: Self-pay | Admitting: Family Medicine

## 2024-02-25 ENCOUNTER — Other Ambulatory Visit: Payer: Self-pay | Admitting: Family Medicine

## 2024-02-25 DIAGNOSIS — K219 Gastro-esophageal reflux disease without esophagitis: Secondary | ICD-10-CM

## 2024-02-25 MED ORDER — SUCRALFATE 1 GM/10ML PO SUSP: 2.0000 g | Freq: Three times a day (TID) | ORAL | 3 refills | Status: AC

## 2024-02-25 NOTE — Telephone Encounter (Signed)
 Sent. Dr. Sherre

## 2024-02-25 NOTE — Telephone Encounter (Signed)
 Spoke with patient, verbalized understanding and had no questions at this time. Told patient to continue taking medication.

## 2024-02-25 NOTE — Telephone Encounter (Unsigned)
 Copied from CRM [id number]. Topic: Clinical - Medication Refill >> Feb 25, 2024 10:21 AM Lonell PEDLAR wrote: Medication: sucralfate  (CARAFATE ) 1 GM/10ML suspension    Has the patient contacted their pharmacy? Yes, pt states out of refills (Agent: If no, request that the patient contact the pharmacy for the refill. If patient does not wish to contact the pharmacy document the reason why and proceed with request.) (Agent: If yes, when and what did the pharmacy advise?)  This is the patient's preferred pharmacy:  [id number] [place] - [address] AT [address] [place] [address] Phone: [phone] Fax: [phone]  Is this the correct pharmacy for this prescription? Yes If no, delete pharmacy and type the correct one.   Has the prescription been filled recently? Yes  Is the patient out of the medication? Yes  Has the patient been seen for an appointment in the last year OR does the patient have an upcoming appointment? Yes  Can we respond through MyChart? No  Agent: Please be advised that Rx refills may take up to 3 business days. We ask that you follow-up with your pharmacy.

## 2024-04-05 ENCOUNTER — Ambulatory Visit

## 2024-04-12 ENCOUNTER — Ambulatory Visit: Admitting: Family Medicine

## 2024-12-13 ENCOUNTER — Inpatient Hospital Stay: Admitting: Oncology
